# Patient Record
Sex: Male | Born: 1950 | Race: White | Hispanic: No | Marital: Married | Smoking: Current some day smoker
Health system: Southern US, Community
[De-identification: ages and names within clinical notes are randomized; demographics above are authoritative.]

## PROBLEM LIST (undated history)

## (undated) DIAGNOSIS — Z8711 Personal history of peptic ulcer disease: Secondary | ICD-10-CM

## (undated) DIAGNOSIS — D494 Neoplasm of unspecified behavior of bladder: Secondary | ICD-10-CM

## (undated) DIAGNOSIS — G4733 Obstructive sleep apnea (adult) (pediatric): Secondary | ICD-10-CM

## (undated) DIAGNOSIS — F32A Depression, unspecified: Secondary | ICD-10-CM

## (undated) DIAGNOSIS — I10 Essential (primary) hypertension: Secondary | ICD-10-CM

## (undated) DIAGNOSIS — I4891 Unspecified atrial fibrillation: Secondary | ICD-10-CM

## (undated) DIAGNOSIS — C801 Malignant (primary) neoplasm, unspecified: Secondary | ICD-10-CM

## (undated) DIAGNOSIS — R51 Headache: Secondary | ICD-10-CM

## (undated) DIAGNOSIS — R3915 Urgency of urination: Secondary | ICD-10-CM

## (undated) DIAGNOSIS — M25569 Pain in unspecified knee: Secondary | ICD-10-CM

## (undated) DIAGNOSIS — R351 Nocturia: Secondary | ICD-10-CM

## (undated) DIAGNOSIS — I2510 Atherosclerotic heart disease of native coronary artery without angina pectoris: Secondary | ICD-10-CM

## (undated) DIAGNOSIS — K219 Gastro-esophageal reflux disease without esophagitis: Secondary | ICD-10-CM

## (undated) DIAGNOSIS — J449 Chronic obstructive pulmonary disease, unspecified: Secondary | ICD-10-CM

## (undated) DIAGNOSIS — G629 Polyneuropathy, unspecified: Secondary | ICD-10-CM

## (undated) DIAGNOSIS — T8859XA Other complications of anesthesia, initial encounter: Secondary | ICD-10-CM

## (undated) DIAGNOSIS — M1990 Unspecified osteoarthritis, unspecified site: Secondary | ICD-10-CM

## (undated) DIAGNOSIS — T4145XA Adverse effect of unspecified anesthetic, initial encounter: Secondary | ICD-10-CM

## (undated) DIAGNOSIS — R519 Headache, unspecified: Secondary | ICD-10-CM

## (undated) DIAGNOSIS — Z8551 Personal history of malignant neoplasm of bladder: Secondary | ICD-10-CM

## (undated) DIAGNOSIS — F329 Major depressive disorder, single episode, unspecified: Secondary | ICD-10-CM

## (undated) DIAGNOSIS — I700 Atherosclerosis of aorta: Secondary | ICD-10-CM

## (undated) DIAGNOSIS — J302 Other seasonal allergic rhinitis: Secondary | ICD-10-CM

## (undated) DIAGNOSIS — Z8719 Personal history of other diseases of the digestive system: Secondary | ICD-10-CM

## (undated) DIAGNOSIS — K5790 Diverticulosis of intestine, part unspecified, without perforation or abscess without bleeding: Secondary | ICD-10-CM

## (undated) DIAGNOSIS — R0602 Shortness of breath: Secondary | ICD-10-CM

## (undated) DIAGNOSIS — G8929 Other chronic pain: Secondary | ICD-10-CM

## (undated) DIAGNOSIS — K648 Other hemorrhoids: Secondary | ICD-10-CM

## (undated) DIAGNOSIS — E119 Type 2 diabetes mellitus without complications: Secondary | ICD-10-CM

## (undated) HISTORY — DX: Unspecified atrial fibrillation: I48.91

## (undated) HISTORY — PX: HAND SURGERY: SHX662

## (undated) HISTORY — DX: Diverticulosis of intestine, part unspecified, without perforation or abscess without bleeding: K57.90

## (undated) HISTORY — PX: BACK SURGERY: SHX140

## (undated) HISTORY — DX: Other hemorrhoids: K64.8

## (undated) HISTORY — PX: CARDIAC CATHETERIZATION: SHX172

## (undated) HISTORY — DX: Atherosclerosis of aorta: I70.0

## (undated) HISTORY — PX: OTHER SURGICAL HISTORY: SHX169

## (undated) HISTORY — PX: BILATERAL KNEE ARTHROSCOPY: SUR91

---

## 1998-05-29 ENCOUNTER — Encounter: Payer: Self-pay | Admitting: Gastroenterology

## 1998-05-29 ENCOUNTER — Ambulatory Visit (HOSPITAL_COMMUNITY): Admission: RE | Admit: 1998-05-29 | Discharge: 1998-05-29 | Payer: Self-pay | Admitting: Gastroenterology

## 1998-11-09 ENCOUNTER — Ambulatory Visit (HOSPITAL_COMMUNITY): Admission: RE | Admit: 1998-11-09 | Discharge: 1998-11-09 | Payer: Self-pay | Admitting: Orthopaedic Surgery

## 2001-03-11 HISTORY — PX: OTHER SURGICAL HISTORY: SHX169

## 2001-08-31 ENCOUNTER — Encounter: Admission: RE | Admit: 2001-08-31 | Discharge: 2001-08-31 | Payer: Self-pay | Admitting: Family Medicine

## 2001-08-31 ENCOUNTER — Encounter: Payer: Self-pay | Admitting: Family Medicine

## 2001-08-31 ENCOUNTER — Inpatient Hospital Stay (HOSPITAL_COMMUNITY): Admission: EM | Admit: 2001-08-31 | Discharge: 2001-09-05 | Payer: Self-pay | Admitting: Emergency Medicine

## 2001-09-02 ENCOUNTER — Encounter: Payer: Self-pay | Admitting: Internal Medicine

## 2001-09-03 ENCOUNTER — Encounter: Payer: Self-pay | Admitting: Internal Medicine

## 2001-09-21 ENCOUNTER — Ambulatory Visit (HOSPITAL_COMMUNITY): Admission: RE | Admit: 2001-09-21 | Discharge: 2001-09-21 | Payer: Self-pay | Admitting: *Deleted

## 2001-09-29 ENCOUNTER — Inpatient Hospital Stay (HOSPITAL_COMMUNITY): Admission: RE | Admit: 2001-09-29 | Discharge: 2001-10-06 | Payer: Self-pay | Admitting: *Deleted

## 2001-09-29 ENCOUNTER — Encounter (INDEPENDENT_AMBULATORY_CARE_PROVIDER_SITE_OTHER): Payer: Self-pay

## 2001-09-29 HISTORY — PX: OTHER SURGICAL HISTORY: SHX169

## 2002-04-21 ENCOUNTER — Encounter: Admission: RE | Admit: 2002-04-21 | Discharge: 2002-04-21 | Payer: Self-pay | Admitting: Family Medicine

## 2002-04-21 ENCOUNTER — Encounter: Payer: Self-pay | Admitting: Family Medicine

## 2004-01-03 ENCOUNTER — Encounter: Admission: RE | Admit: 2004-01-03 | Discharge: 2004-01-03 | Payer: Self-pay | Admitting: Family Medicine

## 2004-04-12 ENCOUNTER — Encounter (HOSPITAL_COMMUNITY): Admission: RE | Admit: 2004-04-12 | Discharge: 2004-07-11 | Payer: Self-pay | Admitting: Family Medicine

## 2004-04-19 ENCOUNTER — Ambulatory Visit (HOSPITAL_COMMUNITY): Admission: RE | Admit: 2004-04-19 | Discharge: 2004-04-19 | Payer: Self-pay | Admitting: Gastroenterology

## 2004-04-19 ENCOUNTER — Encounter (INDEPENDENT_AMBULATORY_CARE_PROVIDER_SITE_OTHER): Payer: Self-pay | Admitting: *Deleted

## 2004-04-24 ENCOUNTER — Ambulatory Visit (HOSPITAL_COMMUNITY): Admission: RE | Admit: 2004-04-24 | Discharge: 2004-04-24 | Payer: Self-pay | Admitting: Family Medicine

## 2004-06-01 ENCOUNTER — Ambulatory Visit: Payer: Self-pay | Admitting: Internal Medicine

## 2004-07-10 ENCOUNTER — Ambulatory Visit: Payer: Self-pay | Admitting: Internal Medicine

## 2004-07-13 ENCOUNTER — Ambulatory Visit: Payer: Self-pay | Admitting: Internal Medicine

## 2004-08-07 ENCOUNTER — Encounter: Admission: RE | Admit: 2004-08-07 | Discharge: 2004-08-07 | Payer: Self-pay | Admitting: Orthopedic Surgery

## 2004-09-03 ENCOUNTER — Encounter: Admission: RE | Admit: 2004-09-03 | Discharge: 2004-09-03 | Payer: Self-pay | Admitting: Orthopedic Surgery

## 2004-09-12 ENCOUNTER — Encounter: Admission: RE | Admit: 2004-09-12 | Discharge: 2004-09-12 | Payer: Self-pay | Admitting: Orthopedic Surgery

## 2004-09-25 ENCOUNTER — Encounter: Admission: RE | Admit: 2004-09-25 | Discharge: 2004-09-25 | Payer: Self-pay | Admitting: Orthopedic Surgery

## 2005-01-17 ENCOUNTER — Encounter: Admission: RE | Admit: 2005-01-17 | Discharge: 2005-01-17 | Payer: Self-pay | Admitting: Orthopedic Surgery

## 2006-03-06 ENCOUNTER — Encounter: Admission: RE | Admit: 2006-03-06 | Discharge: 2006-03-06 | Payer: Self-pay | Admitting: Orthopedic Surgery

## 2006-03-11 HISTORY — PX: OTHER SURGICAL HISTORY: SHX169

## 2006-04-11 ENCOUNTER — Ambulatory Visit (HOSPITAL_COMMUNITY): Admission: RE | Admit: 2006-04-11 | Discharge: 2006-04-11 | Payer: Self-pay | Admitting: Orthopedic Surgery

## 2006-04-11 HISTORY — PX: CARPAL TUNNEL RELEASE: SHX101

## 2006-09-01 ENCOUNTER — Emergency Department (HOSPITAL_COMMUNITY): Admission: EM | Admit: 2006-09-01 | Discharge: 2006-09-01 | Payer: Self-pay | Admitting: Emergency Medicine

## 2007-04-13 ENCOUNTER — Inpatient Hospital Stay (HOSPITAL_BASED_OUTPATIENT_CLINIC_OR_DEPARTMENT_OTHER): Admission: RE | Admit: 2007-04-13 | Discharge: 2007-04-13 | Payer: Self-pay | Admitting: Interventional Cardiology

## 2007-09-22 ENCOUNTER — Ambulatory Visit (HOSPITAL_BASED_OUTPATIENT_CLINIC_OR_DEPARTMENT_OTHER): Admission: RE | Admit: 2007-09-22 | Discharge: 2007-09-22 | Payer: Self-pay | Admitting: Rheumatology

## 2007-09-26 ENCOUNTER — Ambulatory Visit: Payer: Self-pay | Admitting: Internal Medicine

## 2007-10-21 ENCOUNTER — Encounter: Admission: RE | Admit: 2007-10-21 | Discharge: 2007-10-21 | Payer: Self-pay | Admitting: Orthopedic Surgery

## 2009-05-04 ENCOUNTER — Ambulatory Visit (HOSPITAL_COMMUNITY): Admission: RE | Admit: 2009-05-04 | Discharge: 2009-05-04 | Payer: Self-pay | Admitting: Gastroenterology

## 2010-04-02 LAB — URINALYSIS, ROUTINE W REFLEX MICROSCOPIC
Bilirubin Urine: NEGATIVE
Hgb urine dipstick: NEGATIVE
Ketones, ur: NEGATIVE mg/dL
Nitrite: NEGATIVE
Protein, ur: NEGATIVE mg/dL
Specific Gravity, Urine: 1.024 (ref 1.005–1.030)
Urine Glucose, Fasting: NEGATIVE mg/dL
pH: 6 (ref 5.0–8.0)

## 2010-04-02 LAB — URINE CULTURE: Culture: NO GROWTH

## 2010-04-02 LAB — BASIC METABOLIC PANEL
BUN: 16 mg/dL (ref 6–23)
CO2: 28 mEq/L (ref 19–32)
Chloride: 102 mEq/L (ref 96–112)
Creatinine, Ser: 1.1 mg/dL (ref 0.4–1.5)
GFR calc non Af Amer: >60 mL/min (ref >60)
Glucose, Bld: 85 mg/dL (ref 70–99)
Potassium: 4.5 mEq/L (ref 3.5–5.1)
Sodium: 138 mEq/L (ref 135–145)

## 2010-04-02 LAB — CBC
Hemoglobin: 15.2 g/dL (ref 13.0–17.0)
RBC: 5.37 MIL/uL (ref 4.22–5.81)
RDW: 13.7 % (ref 11.5–15.5)

## 2010-04-02 LAB — HEPATIC FUNCTION PANEL
Alkaline Phosphatase: 82 U/L (ref 39–117)
Total Protein: 7.1 g/dL (ref 6.0–8.3)

## 2010-04-02 LAB — TYPE AND SCREEN: Antibody Screen: NEGATIVE

## 2010-04-02 LAB — ABO/RH: ABO/RH(D): O POS

## 2010-04-02 LAB — PROTIME-INR: INR: 1 (ref 0.00–1.49)

## 2010-04-02 LAB — DIFFERENTIAL
Basophils Absolute: 0.1 10*3/uL (ref 0.0–0.1)
Lymphocytes Relative: 35 % (ref 12–46)
Lymphs Abs: 3.6 10*3/uL (ref 0.7–4.0)
Neutrophils Relative %: 55 % (ref 43–77)

## 2010-04-02 LAB — SURGICAL PCR SCREEN: Staphylococcus aureus: NEGATIVE

## 2010-04-03 ENCOUNTER — Inpatient Hospital Stay (HOSPITAL_COMMUNITY)
Admission: RE | Admit: 2010-04-03 | Discharge: 2010-04-06 | Payer: Self-pay | Source: Home / Self Care | Attending: Orthopedic Surgery | Admitting: Orthopedic Surgery

## 2010-04-03 HISTORY — PX: TOTAL KNEE ARTHROPLASTY: SHX125

## 2010-04-04 LAB — BASIC METABOLIC PANEL
BUN: 11 mg/dL (ref 6–23)
Chloride: 103 mEq/L (ref 96–112)
Creatinine, Ser: 1.11 mg/dL (ref 0.4–1.5)
Glucose, Bld: 146 mg/dL — ABNORMAL HIGH (ref 70–99)
Potassium: 3.8 mEq/L (ref 3.5–5.1)

## 2010-04-04 LAB — GLUCOSE, CAPILLARY: Glucose-Capillary: 118 mg/dL — ABNORMAL HIGH (ref 70–99)

## 2010-04-04 LAB — CBC
HCT: 34.6 % — ABNORMAL LOW (ref 39.0–52.0)
MCHC: 33.5 g/dL (ref 30.0–36.0)
Platelets: 190 10*3/uL (ref 150–400)

## 2010-04-04 LAB — PROTIME-INR
INR: 1.13 (ref 0.00–1.49)
Prothrombin Time: 14.7 seconds (ref 11.6–15.2)

## 2010-04-05 LAB — GLUCOSE, CAPILLARY
Glucose-Capillary: 154 mg/dL — ABNORMAL HIGH (ref 70–99)
Glucose-Capillary: 129 mg/dL — ABNORMAL HIGH (ref 70–99)

## 2010-04-05 LAB — BASIC METABOLIC PANEL
BUN: 9 mg/dL (ref 6–23)
Calcium: 9 mg/dL (ref 8.4–10.5)
Creatinine, Ser: 1.18 mg/dL (ref 0.4–1.5)
GFR calc Af Amer: >60 mL/min (ref >60)
GFR calc non Af Amer: >60 mL/min (ref >60)

## 2010-04-05 LAB — PROTIME-INR
INR: 2.11 — ABNORMAL HIGH (ref 0.00–1.49)
Prothrombin Time: 23.8 seconds — ABNORMAL HIGH (ref 11.6–15.2)

## 2010-04-05 LAB — CBC
HCT: 34.1 % — ABNORMAL LOW (ref 39.0–52.0)
Hemoglobin: 11.4 g/dL — ABNORMAL LOW (ref 13.0–17.0)
RBC: 4.1 MIL/uL — ABNORMAL LOW (ref 4.22–5.81)

## 2010-04-06 LAB — GLUCOSE, CAPILLARY: Glucose-Capillary: 147 mg/dL — ABNORMAL HIGH (ref 70–99)

## 2010-04-06 LAB — CBC
Hemoglobin: 11.8 g/dL — ABNORMAL LOW (ref 13.0–17.0)
MCHC: 34 g/dL (ref 30.0–36.0)
WBC: 9.1 10*3/uL (ref 4.0–10.5)

## 2010-04-06 LAB — PROTIME-INR
INR: 2.17 — ABNORMAL HIGH (ref 0.00–1.49)
Prothrombin Time: 24.3 seconds — ABNORMAL HIGH (ref 11.6–15.2)

## 2010-04-11 NOTE — Op Note (Signed)
[name], [name]              ACCOUNT NO.:  [id number]  MEDICAL RECORD NO.:  [id number]          PATIENT TYPE:  INP  LOCATION:  5021                         FACILITY:  [facility]  PHYSICIAN:  Burnard Bunting, M.D.    DATE OF BIRTH:  Jul 09, 1950  DATE OF PROCEDURE:  04/03/2010 DATE OF DISCHARGE:                              OPERATIVE REPORT   PREOPERATIVE DIAGNOSIS:  Right knee arthritis.  POSTOPERATIVE DIAGNOSIS:  Right knee arthritis.  PROCEDURE:  Right total knee replacement.  SURGEON:  Burnard Bunting, MD  ASSISTANT:  Wende Neighbors, PA  ANESTHESIA:  General endotracheal.  ESTIMATED BLOOD LOSS:  100 mL.  TOURNIQUET TIME:  120 minutes at 300 mmHg.  INDICATIONS:  Michael Horn is a 60 year old patient with end-stage right knee arthritis presents for operative management after explanation of risks and benefits.  COMPONENTS UTILIZED:  DePuy rotating platform 4 femur, 5 tibia, 12.5 poly, 38 patella posterior cruciate ligament sacrificing.  PROCEDURE IN DETAIL:  The patient was brought to the operating room where general endotracheal anesthesia was used.  Preoperative antibiotics administered.  Right leg foot and knee was prescrubbed with alcohol and Betadine which allowed to air dry, then prepped DuraPrep solution and draped in sterile manner.  Collier Flowers was used to cover out the field.  Time-out was called.  Right leg was then elevated, exsanguinated with Esmarch wrap and tourniquet was inflated.  Anterior approach to the knee was utilized.  Skin and subcutaneous tissues sharply divided. Median parapatellar approach was made.  Precise location was marked with #1 Vicryl suture and soft tissue was removed from the anterior distal femur, taking care not to disrupt the periosteum over the anterior distal femur.  Fat pad was partially excised.  Lateral patellofemoral ligament was released.  The patient did have a lot of scar tissue from previous-open meniscectomy on the lateral  side.  Fat pad was partially excised.  Two pins were placed in the distal medial femur and proximal medial tibia.  Registration points including hip center, rotation, bimalleolar axis and the various points about the knee were obtained. At this time, a cut was made to perpendicular mechanical axis on the tibia.  Skin and subcutaneous of the posterior structures and collaterals were well protected.  Tensioning was placed in both extension and flexion.  Distal cut was made and a 10 spacer block did fit.  The chamfer cuts and box cut was then made.  Initially, the femur was sized to 5, however minimal posterior condyle resection was made and this was then changed to a 4.  The patient also after the box cut and then keel punch and preparing the tibia, the patient had about a 6- degree flexion contracture, 2 more millimeters were recut off the distal femur and the soft tissue releases posteriorly were performed bluntly. This resulted in full extension with a 10 and 12.5 spacer.  Patella was then prepared in freehand fashion, 38 3 peg patella was placed.  Patella had good tracking using no thumbs technique.  Lateral release was required because of extensive scarring in the lateral retinacular tissues from the prior open lateral meniscectomy.  The patella was not able to be everted during the case and thus somewhat predictable that the lateral release would have to be performed.  Nonetheless, with the patient achieved neutral alignment, full flexion and extension with good patellar tracking with trial components in position, these components were removed.  Cut bony surfaces were irrigated.  True components were cemented into position including the 12.5 poly, which gave 1 degree of hyperextension in neutral alignment.  Cement was allowed to harden. Excess cement was removed.  Incision was then thoroughly irrigated. Tourniquet was released.  Bleeding points encountered and controlled using  electrocautery.  Hemovac drain was placed.  The incision was then closed over bolster using interrupted inverted #1 Vicryl suture, followed by interrupted inverted 0 Vicryl suture, 2-0 Vicryl suture and skin staples.  The small incisions for the pins were irrigated and closed using 3-0 Nylon suture.  Solution of Marcaine, morphine, and clonidine injected into the knee.  The patient tolerated the procedure well without immediate complications.  Bulky wrap was applied.  Velna Hatchet Vernon's assistance was required all times during the case for retraction of important neurovascular structures, opening, closing and drilling.  Her assistance was a medical necessity.     Burnard Bunting, M.D.     GSD/MEDQ  D:  04/03/2010  T:  04/04/2010  Job:  [id number]  Electronically Signed by Reece Agar.  DEAN M.D. on 04/11/2010 03:43:41 PM

## 2010-05-09 NOTE — Discharge Summary (Signed)
  Michael Horn, Michael Horn              ACCOUNT NO.:  [id number]  MEDICAL RECORD NO.:  [id number]          PATIENT TYPE:  INP  LOCATION:  5021                         FACILITY:  [facility]  PHYSICIAN:  Burnard Bunting, M.D.    DATE OF BIRTH:  Jul 20, 1950  DATE OF ADMISSION:  04/03/2010 DATE OF DISCHARGE:  04/06/2010                              DISCHARGE SUMMARY   DISCHARGE DIAGNOSIS:  Right knee arthritis.  PROCEDURES:  Right total knee replacement performed on April 03, 2010.  HOSPITAL COURSE:  Please see the hospital records for details regarding the patient's hospital course and summary.  The patient underwent right total knee replacement on April 03, 2010, tolerated the procedure well, transferred to the floor in stable condition.  Foot was perfused, sensate mobile.  On postop day #1, started on Coumadin for DVT prophylaxis.  Hemoglobin 11 on postop day #2.  INR 2.17 by the time of discharge, hemoglobin 11.8.  He was mobilized in physical therapy and in CPM machine.  His pain was well- controlled.  Incision was intact.  He was discharged home in good condition on April 06, 2010.  He will follow up with me in about 10 days for suture removal.  Continue with home health PT and home CPM and home PT for range of motion exercises.  DISCHARGE MEDICATIONS:  Include 1. Flovent 2 puffs twice daily. 2. Percocet 10/325 one p.o. q.3-4 h. p.r.n. pain. 3. Mucinex one p.o. twice daily as needed. 4. Xanax 1 mg p.o. at night as needed. 5. Pravastatin 40 mg two tabs daily. 6. Lisinopril 5 mg p.o. daily. 7. Celexa 40 mg p.o. daily. 8. Nexium 40 mg p.o. daily. 9. Amaryl 1 mg p.o. daily. 10.Fenofibrate 160 mg p.o. daily. 11.Metformin 850 mg p.o. twice daily. 12.Stool softener. 13.Robaxin 500 mg p.o. q.8 h. p.r.n. spasm. 14.Coumadin 5 mg p.o. daily to INR 2 to 2.5.    Burnard Bunting, M.D.    GSD/MEDQ  D:  04/19/2010  T:  04/20/2010  Job:  [id number]  Electronically Signed by Reece Agar.  DEAN M.D.  on 05/09/2010 08:32:46 AM

## 2010-07-06 ENCOUNTER — Ambulatory Visit (HOSPITAL_BASED_OUTPATIENT_CLINIC_OR_DEPARTMENT_OTHER)
Admission: RE | Admit: 2010-07-06 | Discharge: 2010-07-06 | Disposition: A | Discharge status: Home / Self Care | Payer: BC Managed Care – PPO | Source: Ambulatory Visit | Attending: Urology | Admitting: Urology

## 2010-07-06 ENCOUNTER — Other Ambulatory Visit: Payer: Self-pay | Admitting: Urology

## 2010-07-06 DIAGNOSIS — Z79899 Other long term (current) drug therapy: Secondary | ICD-10-CM | POA: Insufficient documentation

## 2010-07-06 DIAGNOSIS — J4489 Other specified chronic obstructive pulmonary disease: Secondary | ICD-10-CM | POA: Insufficient documentation

## 2010-07-06 DIAGNOSIS — Z7901 Long term (current) use of anticoagulants: Secondary | ICD-10-CM | POA: Insufficient documentation

## 2010-07-06 DIAGNOSIS — Z87891 Personal history of nicotine dependence: Secondary | ICD-10-CM | POA: Insufficient documentation

## 2010-07-06 DIAGNOSIS — K219 Gastro-esophageal reflux disease without esophagitis: Secondary | ICD-10-CM | POA: Insufficient documentation

## 2010-07-06 DIAGNOSIS — E119 Type 2 diabetes mellitus without complications: Secondary | ICD-10-CM | POA: Insufficient documentation

## 2010-07-06 DIAGNOSIS — C679 Malignant neoplasm of bladder, unspecified: Secondary | ICD-10-CM | POA: Insufficient documentation

## 2010-07-06 DIAGNOSIS — E7800 Pure hypercholesterolemia, unspecified: Secondary | ICD-10-CM | POA: Insufficient documentation

## 2010-07-06 DIAGNOSIS — Z01812 Encounter for preprocedural laboratory examination: Secondary | ICD-10-CM | POA: Insufficient documentation

## 2010-07-06 DIAGNOSIS — N3289 Other specified disorders of bladder: Secondary | ICD-10-CM | POA: Insufficient documentation

## 2010-07-06 DIAGNOSIS — R310 Gross hematuria: Secondary | ICD-10-CM | POA: Insufficient documentation

## 2010-07-06 DIAGNOSIS — J449 Chronic obstructive pulmonary disease, unspecified: Secondary | ICD-10-CM | POA: Insufficient documentation

## 2010-07-06 DIAGNOSIS — I10 Essential (primary) hypertension: Secondary | ICD-10-CM | POA: Insufficient documentation

## 2010-07-06 HISTORY — PX: TRANSURETHRAL RESECTION OF BLADDER TUMOR: SHX2575

## 2010-07-06 LAB — GLUCOSE, CAPILLARY: Glucose-Capillary: 166 mg/dL — ABNORMAL HIGH (ref 70–99)

## 2010-07-06 LAB — POCT I-STAT 4, (NA,K, GLUC, HGB,HCT)
Glucose, Bld: 155 mg/dL — ABNORMAL HIGH (ref 70–99)
HCT: 38 % — ABNORMAL LOW (ref 39.0–52.0)
Hemoglobin: 12.9 g/dL — ABNORMAL LOW (ref 13.0–17.0)
Potassium: 4.1 mEq/L (ref 3.5–5.1)
Sodium: 140 mEq/L (ref 135–145)

## 2010-07-10 NOTE — Op Note (Signed)
  [name], Michael Horn              ACCOUNT NO.:  [id number]  MEDICAL RECORD NO.:  [id number]          PATIENT TYPE:  INP  LOCATION:  5021                         FACILITY:  [facility]  PHYSICIAN:  Charae Depaolis C. Vernie Ammons, M.D.  DATE OF BIRTH:  1950-05-18  DATE OF PROCEDURE:  07/06/2010 DATE OF DISCHARGE:  04/06/2010                              OPERATIVE REPORT   PREOPERATIVE DIAGNOSIS:  Bladder tumor.  POSTOPERATIVE DIAGNOSIS:  Bladder tumor.  PROCEDURE:  Transurethral resection of bladder tumor (3 cm).  SURGEON:  Yamari Ventola C. Vernie Ammons, M.D.  ANESTHESIA:  General.  SPECIMENS: 1. Portions of bladder tumor. 2. Cold cup biopsy of base of bladder tumor/bladder wall.  BLOOD LOSS:  Minimal.  DRAINS:  None.  COMPLICATIONS:  None.  INDICATIONS:  The patient is a 60 year old male who experienced gross hematuria on Coumadin.  Upper tract study was normal and cystoscopy revealed a papillary tumor involving the right trigonal region and right ureteral orifice but primarily located posterior and lateral to the right ureteral orifice.  We discussed the planned procedure as well as its risks, complications and limitations.  The patient understands and elected to proceed.  DESCRIPTION OF OPERATION:  After informed consent, the patient was brought to the major OR, placed on table, administered general anesthesia, then moved to the dorsal lithotomy position.  His genitalia was then sterilely prepped and draped and an official time-out was then performed.  Initially, the 22-French cystoscope with 12-degree lens was passed under direct vision down the urethra which was noted to be entirely normal and through the prostatic urethra which was noted to have bilobar hypertrophy and elongation of the prostatic urethra.  Upon entering the bladder, it was fully and systematically inspected.  There was 1+ trabeculation noted.  The ureteral orifices were of normal configuration and position.  The single tumor  was identified over on the right hemitrigone/ureteral orifice region and did have papillary abnormality involving the right ureteral orifice superficially.  The cystoscope was removed and the 28-French resectoscope with Timberlake obturator introduced in the bladder, the obturator removed and the resectoscope element with 12-degree lens inserted.  I then resected the bladder tumor away completely down to the bladder wall.  I also resected across the right ureteral orifice but did not coagulate in this area.  I then inserted cold cup biopsy forceps and took a cold cup biopsy from the detrusor at the base of the bladder tumor and sent this separately. I then reinserted the resectoscope and fulgurated bleeding points. After all bladder tumor had been removed from the bladder, the bladder was drained and the resectoscope was removed and the patient was awakened and taken to recovery room in stable and satisfactory condition.  He tolerated the procedure well and there were no intraoperative complications.  He will be given a prescription for Vicodin HP #24 and Pyridium 200 #30 with written instructions and followup in my office in 1 week.     Tinika Bucknam C. Vernie Ammons, M.D.     MCO/MEDQ  D:  07/06/2010  T:  07/06/2010  Job:  [id number]  Electronically Signed by Ihor Gully M.D. on 07/10/2010 04:12:23 AM

## 2010-07-24 NOTE — Cardiovascular Report (Signed)
[name], [name]              ACCOUNT NO.:  [id number]   MEDICAL RECORD NO.:  [id number]          PATIENT TYPE:  OIB   LOCATION:  1962                         FACILITY:  [facility]   PHYSICIAN:  Corky Crafts, MDDATE OF BIRTH:  07/26/1950   DATE OF PROCEDURE:  DATE OF DISCHARGE:                            CARDIAC CATHETERIZATION   PROCEDURES:  Left heart catheterization, left ventriculogram coronary  angiogram, abdominal aortogram.   INDICATIONS:  Abnormal stress test, chest pain, diabetes, high  triglycerides.   PROCEDURE:  The risks and benefits of cardiac catheterization were  explained to the patient and informed consent was obtained.  The patient  was brought to the cath lab.  He was prepped and draped in the usual  sterile fashion.  His right groin was infiltrated with 1% lidocaine.  A  4-French arterial sheath was placed into the right femoral artery using  modified Seldinger technique.  Left coronary artery angiography was  initially attempted with a JL-4 pigtail catheter.  Eventually a JL-5  catheter was used.  Digital angiography was performed in multiple  projections using hand injection of contrast.  Right coronary artery  angiography was performed using a JR-4.5 catheter.  Catheter was  advanced to the vessel ostium under fluoroscopic guidance.  Digital  angiography was performed in multiple projections using hand injection  of contrast.  Pigtail catheter was advanced to the ascending aorta and  across the aortic valve under fluoroscopic guidance.  A power injection  of contrast was performed in the RAO projection to image the left  ventricle.  The catheter was pulled back under continuous hemodynamic  pressure monitoring.  The catheter was then pulled back to the abdominal  aorta.  Power injection of contrast was performed in the AP projection  to image the abdominal aorta.  The sheath was removed using manual  compression.   FINDINGS:  The left main coronary  artery was normal in size and widely  patent.  The circumflex is a medium-sized vessel and widely patent.  There is a medium-sized first obtuse marginal which was widely patent.  There is a medium-sized ramus intermediate vessel which was branching  and widely patent.  There are mild irregularities in this vessel.  The  left anterior descending was a large vessel with a minimal luminal  irregularities throughout.  This vessel did wrap around to the apex.  There is a first diagonal which is medium-sized and widely patent.  There is a second diagonal which was slightly larger and also widely  patent with no significant irregularities.  The right coronary artery  was a large dominant vessel and angiographically normal.  The abdominal  aortogram showed bilateral single renal arteries which were widely  patent.  There was no abdominal aortic aneurysm.   HEMODYNAMIC RESULTS:  Left ventricular pressure 92/80 with an LVEDP of  13 mmHg.  Aortic pressure of 96/60 with a mean aortic pressure of 77  mmHg.  Left ventriculogram showed normal ventricular function with an  estimated ejection fraction of 60%.   IMPRESSION:  1. No significant coronary artery disease.  The patient is likely  having noncardiac chest pain.  2. Normal ventricular function.  3. No abdominal aortic aneurysm.  4. Normal hemodynamics.  5. No renal artery stenosis   RECOMMENDATIONS:  Continue aggressive risk factor modification and  smoking cessation attempts.  I will follow-up with him in the office.      Corky Crafts, MD  Electronically Signed     JSV/MEDQ  D:  04/13/2007  T:  04/13/2007  Job:  [id number]

## 2010-07-27 NOTE — Op Note (Signed)
[facility]  Patient:    Michael Horn, Michael Horn Visit Number: [id number] MRN: [id number]          Service Type: SUR Location: 3W 0358 01 Attending Physician:  Vikki Ports Dictated by:   Vikki Ports, M.D. Proc. Date: 09/29/01 Admit Date:  09/29/2001   CC:         Melvyn Neth D. Clovis Riley, M.D.   Operative Report  PREOPERATIVE DIAGNOSIS:  Sigmoid diverticulitis.  POSTOPERATIVE DIAGNOSIS:  Sigmoid diverticulitis.  PROCEDURE:  Sigmoid colectomy with splenic flexure mobilization.  SURGEON:  Catalina Lunger, M.D.  ASSISTANT:  Sheppard Plumber. Earlene Plater, M.D.  ANESTHESIA:  General.  DESCRIPTION OF PROCEDURE:  The patient was taken to the operating room and placed in the supine position.  After adequate anesthesia was induced using endotracheal tube, the abdomen was prepped and draped in the normal sterile fashion.  Using a vertical lower midline incision, I dissected down to the fascia and opened it along the midline.  The peritoneum was entered.  Sigmoid colon was identified.  The remainder of the abdominal cavity was inspected. No pathology was noted.  Sigmoid colon was quite inflamed and had a localized abscess adjacent to the lateral peritoneum just lateral and proximal to the left iliac vessels.  The inflammation was taken down using blunt dissection, and the splenic flexure was mobilized.  It was transected using the GIA stapling device just at the junction of the rectum and sigmoid.  The mesentery was taken down between [facility] clamps.  The proximal end was identified, appeared to be healthy bowel in the mid descending colon.  Splenic flexure was quite adherent to the spleen and therefore had to be completely mobilized in order to perform the anastomosis without tension.  After this had been accomplished, the GI stapling device completed the division of the descending colon, and the specimen was removed.  An end-to-end anastomosis  using interrupted 3-0 silk full-thickness sutures was accomplished.  Adequate hemostasis was ensured.  The anastomosis was under no tension.  The proximal colon was clamped as it had been during the anastomosis, and rigid sigmoidoscopy was performed.  The anastomosis was visualized.  The colon and the anastomosis were well-distended with saline in the pelvis.  There was no evidence of leak.  The abdomen was copiously irrigated.  The fascia was closed with running #1 Novofil.  Skin was closed with staples.  The patient tolerated the procedure well and went to PACU in good condition. Dictated by:   Vikki Ports, M.D. Attending Physician:  Danna Hefty R. DD:  09/29/01 TD:  10/03/01 Job: [id number]
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
## 2010-07-27 NOTE — Discharge Summary (Signed)
[place]  Patient:    Michael Horn, Michael Horn Visit Number: [id number] MRN: [id number]          Service Type: MED Location: 5100 [id number] 01 Attending Physician:  Selina Cooley Dictated by:   Selina Cooley, M.D. Admit Date:  08/31/2001 Discharge Date: 09/05/2001   CC:         Melvyn Neth D. Clovis Riley, M.D.  Vikki Ports, M.D.   Discharge Summary  DISCHARGE DIAGNOSES: 1. Diverticulitis of descending and sigmoid colon. 2. Leukocytosis, 14.7. 3. Alcohol abuse. 4. Tobacco use. 5. History of umbilical hernia repair, back surgery, peptic ulcer disease.  DISCHARGE MEDICATIONS: 1. Ciprofloxacin 500 mg p.o. b.i.d. X9 days. 2. Flagyl 500 mg q.i.d. X9 days. 3. Thiamine 100 mg p.o. q.d. X1 week. 4. Multivitamin one p.o. q.d.  FOLLOW UP:  Follow up with Dr. Luan Pulling in two to three weeks.  Follow up with Dr. Clovis Riley in five to seven days.  CONDITION ON DISCHARGE:  Stable, tolerating p.o. diet with markedly improved pain, afebrile.  REASON FOR ADMISSION:  The patient is a 60 year old white male presenting with a three day history of left lower quadrant pain associated with fever and chills.  HOSPITAL COURSE: #1 - ACUTE DIVERTICULITIS OF DESCENDING AND SIGMOID COLON:  General surgery consulted on this case.  CT scan was consistent with diverticulitis.  The patient was treated conservatively with intravenous antibiotics which were transitioned to p.o. antibiotics at the time of discharge.  His fever and leukocytosis resolved as well as his symptoms of left lower quadrant pain and signs of rebound.  A follow up CT scan did reveal some slight increase in inflammation and extra luminal air without evidence of abscess.  It is possible there might have been a small rupture of the diverticulum, however, the patient ultimately improved on conservative management and was discharged to home.  #2 - ALCOHOL ABUSE:  The patient did endorse drinking  approximately one case of beer a week.  There was no evidence of withdrawal during his hospitalization.  He was treated with multivitamin.  #3 - DEHYDRATION, HYPOKALEMIA:  Corrected. Dictated by:   Selina Cooley, M.D. Attending Physician:  Selina Cooley DD:  09/15/01 TD:  09/18/01 Job: [id number]

## 2010-07-27 NOTE — Op Note (Signed)
Michael Horn, FREEL              ACCOUNT NO.:  [id number]   MEDICAL RECORD NO.:  [id number]          PATIENT TYPE:  AMB   LOCATION:  ENDO                         FACILITY:  [facility]   PHYSICIAN:  Danise Edge, M.D.   DATE OF BIRTH:  Aug 04, 1950   DATE OF PROCEDURE:  04/19/2004  DATE OF DISCHARGE:                                 OPERATIVE REPORT   PROCEDURE PERFORMED:  Colonoscopy and rectal polypectomy.   INDICATIONS:  Mr. Dreyton Roessner is a 60 year old male born March 03, 1951. Mr. Raju is scheduled to undergo his first screening colonoscopy  with polypectomy to prevent colon cancer. He did undergo a segmental colon  resection performed by Vikki Ports, MD to treat diverticular  perforation with abscess.   ENDOSCOPIST:  Danise Edge, M.D.   PREMEDICATION:  Versed 8.5 milligrams, Demerol 60 milligrams.   PROCEDURE:  After obtaining informed consent, Mr. Orton was placed in the  left lateral decubitus position. I administered intravenous Demerol and  intravenous Versed to achieve conscious sedation for the procedure. The  patient's blood pressure, oxygen saturation and cardiac rhythm were  monitored throughout the procedure and documented in the medical record.   Anal inspection and digital rectal exam were normal. The Olympus adjustable  pediatric colonoscope was introduced into the rectum and advanced to the  cecum. Colonic preparation for the exam today was excellent.   Rectum. From the distal rectum, a 2 mm sessile polyp was removed with  electrocautery snare.  Sigmoid colon and descending colon normal.  Splenic flexure normal.  Transverse colon normal.  Hepatic flexure normal.  Ascending colon normal.  Cecum and ileocecal valve normal.   ASSESSMENT:  A small polyp was removed from the distal rectum; otherwise  normal proctocolonoscopy to cecum.      MJ/MEDQ  D:  04/19/2004  T:  04/19/2004  Job:  [id number]   cc:   Donia Guiles, M.D.  [address] [place]  [place] [address]  Fax: [phone]

## 2010-07-27 NOTE — Discharge Summary (Signed)
   Michael Horn, Michael Horn                        ACCOUNT NO.:  [id number]   MEDICAL RECORD NO.:  [id number]                   PATIENT TYPE:  INP   LOCATION:  0358                                 FACILITY:  [facility]   PHYSICIAN:  Vikki Ports, M.D.         DATE OF BIRTH:  [birth year]   DATE OF ADMISSION:  09/29/2001  DATE OF DISCHARGE:  10/06/2001                                 DISCHARGE SUMMARY   ADMISSION DIAGNOSIS:  Diverticulitis.   DISCHARGE DIAGNOSIS:  Diverticulitis.   SECONDARY DIAGNOSES:  1. Alcohol abuse.  2. Tobacco abuse.  3. History of umbilical hernia repair.  4. History of back surgery.  5. History of peptic ulcer disease.   CONDITION ON DISCHARGE:  Good and improved.   PROCEDURE:  Sigmoid colectomy.   SURGEON:  Vikki Ports, M.D.   HISTORY OF PRESENT ILLNESS:  Briefly, the patient is a 60 year old white  male who was hospitalized at [facility] approximately  six weeks prior to this admission for nonsurgical treatment of acute  diverticulitis with localized perforation.  The patient did well on IV  antibiotics, was maintained on p.o. antibiotics at home.  The pain was  significantly improved.  Leukocytosis had resolved and the patient was  prepared for sigmoid colectomy.   HOSPITAL COURSE:  The patient was admitted after home bowel prep, taken to  the operating room where he underwent sigmoid colectomy.  His postoperative  course was very routine.  Foley catheter was removed on postoperative day  #1.  Abdomen was soft.  He remained afebrile.  He began passing some gas on  postoperative day #4.  On postoperative day #5, he still remained somewhat  distended but continued to pass some gas.  On postoperative day #6, he began  feeling hungry and tolerating a clear liquid diet.  The patient had small  bowel movement and was ready for discharge home.   FOLLOW UP:  This is with me two weeks after discharge.   DISCHARGE  MEDICATION:  Vicodin one to two q.4h. p.r.n. pain.   ACTIVITY:  No heavy lifting over 10 pounds.                                               Vikki Ports, M.D.   KRH/MEDQ  D:  11/03/2001  T:  11/04/2001  Job:  [id number]

## 2010-07-27 NOTE — Op Note (Signed)
NAME:  Michael Horn, Michael Horn              ACCOUNT NO.:  [id number]   MEDICAL RECORD NO.:  [id number]          PATIENT TYPE:  AMB   LOCATION:  SDS                          FACILITY:  [facility]   PHYSICIAN:  Burnard Bunting, M.D.    DATE OF BIRTH:  [birth year]   DATE OF PROCEDURE:  04/11/2006  DATE OF DISCHARGE:                               OPERATIVE REPORT   PREOPERATIVE DIAGNOSES:  Right carpal tunnel syndrome, and right third  trigger finger.   POSTOPERATIVE DIAGNOSES:  Right carpal tunnel syndrome, and right third  trigger finger.   PROCEDURES:  Right carpal tunnel release, and trigger finger release,  #3.   SURGEON:  Burnard Bunting, M.D.   ASSISTANT:  None.   ANESTHESIA:  General.   ESTIMATED BLOOD LOSS:  10 mL.   TOURNIQUET TIME:  Approximately 30 minutes at 250 mmHg.   INDICATIONS:  Michael Horn is a 60 year old patient with severe right  third trigger finger and carpal tunnel syndrome.  He presents now for  operative management after explanation of risks and benefits and failure  of conservative management.   OPERATIVE PROCEDURES IN DETAIL:  The patient was brought to the  operating room, where general endotracheal anesthesia was induced.  Preoperative IV antibiotics were administered.  The right hand and arm  were prepped with Hibiclens and sterilely prepped with DuraPrep solution  and draped in a sterile manner.  Topographic anatomy of the hand was  identified, including the [facility] cardinal line, the distal wrist flexion  crease, as well as the radial border of the fourth finger.  The hand was  elevated and exsanguinated with the Esmarch wrap.  The tourniquet was  inflated.  An incision was made on the border at the intersection of the  [place] cardinal line and the radial border of the fourth finger down to  the proximal wrist flexion crease.  The skin and subcutaneous tissues  were sharply divided.  The palmar fascia was divided.  The transverse  carpal ligament was  identified and incised in its midportion over 2 to 3  mm.  A right-angle retractor was then placed between the median nerve  and the transverse carpal ligament, which was then released under direct  visualization distally and proximally.  At this time, the incision was  thoroughly irrigated.  No masses were palpable within the carpal tunnel.  The motor branch was intact.  The skin was anesthetized with Marcaine.  At this time, an incision was made over the A1 pulley of the third  finger transversely.  The skin and subcutaneous tissues were sharply  divided.  The neurovascular bundles were retracted ulnarly and radially.  The proximal and distal aspects of the A1 pulley were identified under  direct visualization and transected longitudinally.  A finger was taken  through a range of motion and no triggering was present.  This incision  was also irrigated, and the skin was anesthetized with Marcaine.  The  tourniquet was released.  Bleeding  points encountered were controlled using bipolar electrocautery.  The  skin incisions were closed using a 3-0 nylon suture.  A  bulky dressing  was then applied.  The patient tolerated the procedure well without  immediate complication.      Burnard Bunting, M.D.  Electronically Signed     GSD/MEDQ  D:  04/11/2006  T:  04/11/2006  Job:  [id number]

## 2010-08-19 ENCOUNTER — Emergency Department (HOSPITAL_COMMUNITY): Payer: Medicare Other

## 2010-08-19 ENCOUNTER — Encounter (HOSPITAL_COMMUNITY): Payer: Self-pay | Admitting: Radiology

## 2010-08-19 ENCOUNTER — Emergency Department (HOSPITAL_COMMUNITY)
Admission: EM | Admit: 2010-08-19 | Discharge: 2010-08-19 | Disposition: A | Discharge status: Home / Self Care | Payer: Medicare Other | Attending: Emergency Medicine | Admitting: Emergency Medicine

## 2010-08-19 DIAGNOSIS — J449 Chronic obstructive pulmonary disease, unspecified: Secondary | ICD-10-CM | POA: Insufficient documentation

## 2010-08-19 DIAGNOSIS — E119 Type 2 diabetes mellitus without complications: Secondary | ICD-10-CM | POA: Insufficient documentation

## 2010-08-19 DIAGNOSIS — R51 Headache: Secondary | ICD-10-CM | POA: Insufficient documentation

## 2010-08-19 DIAGNOSIS — R209 Unspecified disturbances of skin sensation: Secondary | ICD-10-CM | POA: Insufficient documentation

## 2010-08-19 DIAGNOSIS — K5730 Diverticulosis of large intestine without perforation or abscess without bleeding: Secondary | ICD-10-CM | POA: Insufficient documentation

## 2010-08-19 DIAGNOSIS — N400 Enlarged prostate without lower urinary tract symptoms: Secondary | ICD-10-CM | POA: Insufficient documentation

## 2010-08-19 DIAGNOSIS — J4489 Other specified chronic obstructive pulmonary disease: Secondary | ICD-10-CM | POA: Insufficient documentation

## 2010-08-19 DIAGNOSIS — K219 Gastro-esophageal reflux disease without esophagitis: Secondary | ICD-10-CM | POA: Insufficient documentation

## 2010-08-19 HISTORY — DX: Essential (primary) hypertension: I10

## 2010-08-19 HISTORY — DX: Malignant (primary) neoplasm, unspecified: C80.1

## 2010-08-19 LAB — DIFFERENTIAL
Basophils Absolute: 0 10*3/uL (ref 0.0–0.1)
Basophils Relative: 0 % (ref 0–1)
Eosinophils Relative: 2 % (ref 0–5)
Monocytes Absolute: 0.4 10*3/uL (ref 0.1–1.0)
Neutro Abs: 4.7 10*3/uL (ref 1.7–7.7)

## 2010-08-19 LAB — COMPREHENSIVE METABOLIC PANEL
AST: 53 U/L — ABNORMAL HIGH (ref 0–37)
CO2: 22 mEq/L (ref 19–32)
Calcium: 9.5 mg/dL (ref 8.4–10.5)
Creatinine, Ser: 1.02 mg/dL (ref 0.4–1.5)
GFR calc non Af Amer: >60 mL/min (ref >60)

## 2010-08-19 LAB — CK TOTAL AND CKMB (NOT AT ARMC)
CK, MB: 8.3 ng/mL (ref 0.3–4.0)
Relative Index: 1.9 (ref 0.0–2.5)

## 2010-08-19 LAB — CBC
MCHC: 33.3 g/dL (ref 30.0–36.0)
RDW: 14.6 % (ref 11.5–15.5)

## 2010-08-19 MED ORDER — GADOBENATE DIMEGLUMINE 529 MG/ML IV SOLN
20.0000 mL | Freq: Once | INTRAVENOUS | Status: AC | PRN
  Administered 2010-08-19: 20 mL via INTRAVENOUS

## 2010-12-26 LAB — I-STAT 8, (EC8 V) (CONVERTED LAB)
Acid-Base Excess: 4 — ABNORMAL HIGH
Bicarbonate: 28.2 — ABNORMAL HIGH
HCT: 49
Operator id: 285841
pCO2, Ven: 39.4 — ABNORMAL LOW

## 2010-12-26 LAB — URINALYSIS, ROUTINE W REFLEX MICROSCOPIC
Bilirubin Urine: NEGATIVE
Glucose, UA: >1000 — AB
Ketones, ur: 15 — AB
Leukocytes, UA: NEGATIVE
pH: 5.5

## 2010-12-26 LAB — CBC
MCHC: 34.9
RBC: 5.4
RDW: 12.7

## 2010-12-26 LAB — DIFFERENTIAL
Basophils Absolute: 0.1
Basophils Relative: 2 — ABNORMAL HIGH
Monocytes Relative: 5
Neutro Abs: 5
Neutrophils Relative %: 57

## 2010-12-26 LAB — POCT I-STAT CREATININE
Creatinine, Ser: 1.2
Operator id: 285841

## 2010-12-26 LAB — URINE MICROSCOPIC-ADD ON

## 2011-01-02 ENCOUNTER — Ambulatory Visit (HOSPITAL_BASED_OUTPATIENT_CLINIC_OR_DEPARTMENT_OTHER)
Admission: RE | Admit: 2011-01-02 | Discharge: 2011-01-03 | Disposition: A | Discharge status: Home / Self Care | Payer: Medicare Other | Source: Ambulatory Visit | Attending: Orthopedic Surgery | Admitting: Orthopedic Surgery

## 2011-01-02 DIAGNOSIS — Z01812 Encounter for preprocedural laboratory examination: Secondary | ICD-10-CM | POA: Insufficient documentation

## 2011-01-02 DIAGNOSIS — S43429A Sprain of unspecified rotator cuff capsule, initial encounter: Secondary | ICD-10-CM | POA: Insufficient documentation

## 2011-01-02 DIAGNOSIS — E119 Type 2 diabetes mellitus without complications: Secondary | ICD-10-CM | POA: Insufficient documentation

## 2011-01-02 DIAGNOSIS — X58XXXA Exposure to other specified factors, initial encounter: Secondary | ICD-10-CM | POA: Insufficient documentation

## 2011-01-02 DIAGNOSIS — J449 Chronic obstructive pulmonary disease, unspecified: Secondary | ICD-10-CM | POA: Insufficient documentation

## 2011-01-02 DIAGNOSIS — M25519 Pain in unspecified shoulder: Secondary | ICD-10-CM | POA: Insufficient documentation

## 2011-01-02 DIAGNOSIS — J4489 Other specified chronic obstructive pulmonary disease: Secondary | ICD-10-CM | POA: Insufficient documentation

## 2011-01-02 DIAGNOSIS — I10 Essential (primary) hypertension: Secondary | ICD-10-CM | POA: Insufficient documentation

## 2011-01-02 HISTORY — PX: OTHER SURGICAL HISTORY: SHX169

## 2011-01-02 LAB — GLUCOSE, CAPILLARY
Glucose-Capillary: 255 mg/dL — ABNORMAL HIGH (ref 70–99)
Glucose-Capillary: 203 mg/dL — ABNORMAL HIGH (ref 70–99)

## 2011-01-03 LAB — POCT I-STAT 4, (NA,K, GLUC, HGB,HCT)
Glucose, Bld: 243 mg/dL — ABNORMAL HIGH (ref 70–99)
HCT: 38 % — ABNORMAL LOW (ref 39.0–52.0)
Hemoglobin: 12.9 g/dL — ABNORMAL LOW (ref 13.0–17.0)
Sodium: 135 mEq/L (ref 135–145)

## 2011-01-03 LAB — GLUCOSE, CAPILLARY: Glucose-Capillary: 226 mg/dL — ABNORMAL HIGH (ref 70–99)

## 2011-01-09 NOTE — Op Note (Signed)
  NAMEELMORE, HYSLOP              ACCOUNT NO.:  [id number]  MEDICAL RECORD NO.:  [id number]  LOCATION:                               FACILITY:  [facility]  PHYSICIAN:  Marlowe Kays, M.D.  DATE OF BIRTH:  1950-12-10  DATE OF PROCEDURE:  01/02/2011 DATE OF DISCHARGE:                              OPERATIVE REPORT   PREOPERATIVE DIAGNOSIS:  Recurrent rotator cuff tear, left shoulder.  POSTOPERATIVE DIAGNOSIS:  Recurrent rotator cuff tear, left shoulder.  OPERATION:  Anterior acromionectomy and repair of recurrent rotator cuff tear, left shoulder.  SURGEON:  Marlowe Kays, M.D.  ASSISTANTDruscilla Brownie. Cherlynn June.  ANESTHESIA:  General preceded by interscalene block.  FINAL JUSTIFICATION FOR PROCEDURE:  He had had rotator cuff repaired elsewhere in 2006 and he has had a 4 months history of locking in the shoulder and pain, with an MRI demonstrating a recurrent rotator cuff tear and no other substantial abnormalities to account for the pain and locking.  Mr. Angie Fava assistance was necessary to assist with arm holding retraction, handling of instruments with 3 pairs of hands required for the case.  PROCEDURE:  Prophylactic antibiotics, interscalene block by anesthesia, satisfied general anesthesia, beachchair position on the sliding frame, left shoulder girdle was prepped with DuraPrep, draped in sterile field. Time-out performed.  Vertical midline incision down to the acromion and with subperiosteal dissection using cutting cautery, we dissected fascia to expose the anterior acromion.  He had a large prominent acromion and placed a small Cobb elevator beneath the anterior portion followed by larger Cobb elevator and performed my initial anterior acromionectomy with micro saw.  I then made a substantial amount of additional decompression with combination of the saw, rongeur, and rasp.  The rotator cuff tear was identified as noted on the MRI being about 3 cm long from  front to back.  After roughening up the articular cartilage above the greater tuberosity, I went ahead and used a 4 stranded Stryker anchor, splicing the 4 strands along the rotator cuff tear, which I was unable to advance lateralward using a second row of suture after tying the first row at the tail end of the rotator cuff into the lateral humeral tissue.  This seemed to give a nice stable repair.  The rotator cuff was thinned out laterally, but was reapproximated to healthy looking tendon stump and I did not feel that any graft was indicated. Wound was then irrigated with sterile saline.  I closed the small slit in the deltoid muscle with interrupted #1 Vicryl as well as the fascia over the residual anterior acromion with the same, subcutaneous tissue was closed with 2-0 Vicryl, and staples in the skin.  Betadine, Adaptic, dry sterile dressing were applied.  He tolerated the procedure well.  He was taken to the recovery room in satisfactory condition with no known complications and minimal blood loss.          ______________________________ Marlowe Kays, M.D.     JA/MEDQ  D:  01/02/2011  T:  01/03/2011  Job:  [id number]  Electronically Signed by Marlowe Kays M.D. on 01/09/2011 11:52:03 AM

## 2011-04-19 ENCOUNTER — Other Ambulatory Visit (HOSPITAL_COMMUNITY): Payer: Self-pay | Admitting: Orthopedic Surgery

## 2011-04-19 DIAGNOSIS — M25561 Pain in right knee: Secondary | ICD-10-CM

## 2011-04-29 ENCOUNTER — Other Ambulatory Visit (HOSPITAL_COMMUNITY): Payer: Medicare Other

## 2011-04-29 ENCOUNTER — Encounter (HOSPITAL_COMMUNITY): Payer: Medicare Other

## 2011-05-06 ENCOUNTER — Other Ambulatory Visit: Payer: Self-pay | Admitting: Urology

## 2011-05-15 ENCOUNTER — Encounter (HOSPITAL_BASED_OUTPATIENT_CLINIC_OR_DEPARTMENT_OTHER): Payer: Self-pay | Admitting: *Deleted

## 2011-05-15 NOTE — Progress Notes (Signed)
NPO AFTER MN. ARRIVES AT 0915. NEEDS ISTAT. CURRENT EKG W/ CHART. WILL TAKE NEXIUM AM OF SURG. W/ SIP OF WATER.

## 2011-05-19 DIAGNOSIS — C679 Malignant neoplasm of bladder, unspecified: Secondary | ICD-10-CM

## 2011-05-19 NOTE — H&P (Signed)
History of Present Illness     Transitional cell carcinoma of the bladder:  He experienced an episode of gross hematuria while on Coumadin. His workup revealed no abnormality of the upper tract by CT scan however he was found to have a papillary tumor involving the right hemitrigone and ureteral orifice. On 07/06/10 he underwent TUR-BT which revealed a papillary transitional cell carcinoma that was low-grade and superficial (G1,Ta). He has smoked 1-1/2 packs of cigarettes a day for 46 years.  He had a single small recurrence in either 11/12 that was treated in the office with the Bugbee. Due to the small size and low grade appearance of the lesion an induction course of BCG was not initiated.   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 His PSA in 4/12 was noted to be normal at 0.92.    Interval history: He has been doing well with no new voiding complaints or hematuria.   Past Medical History Problems  1. History of  Arthritis V13.4 2. History of  Depression 311 3. History of  Diabetes Mellitus 250.00 4. History of  Diverticulitis Of Colon 562.11 5. History of  Esophageal Reflux 530.81 6. History of  Hypercholesterolemia 272.0 7. History of  Transitional Cell Carcinoma Of The Bladder V10.51  Surgical History Problems  1. History of  Cystoscopy With Fulguration Medium Lesion (2-5cm) 2. History of  Knee Replacement Right 3. History of  Knee Replacement Left 4. History of  Partial Colectomy 5. History of  Shoulder Surgery Bilateral  Current Meds 1. Accu-Chek Aviva In Vitro Strip; Therapy: (Recorded:04Apr2012) to 2. ALPRAZolam 1 MG Oral Tablet; Therapy: (Recorded:04Apr2012) to 3. Citalopram Hydrobromide 40 MG Oral Tablet; Therapy: (Recorded:04Apr2012) to 4. Fenofibrate 160 MG Oral Tablet; Therapy: (Recorded:04Apr2012) to 5. Fish  Oil CAPS; Therapy: (Recorded:04Apr2012) to 6. Flovent HFA 44 MCG/ACT Inhalation Aerosol; Therapy: (Recorded:04Apr2012) to 7. Glimepiride 1 MG Oral Tablet; Therapy: (Recorded:04Apr2012) to 8. Ketorolac Tromethamine 10 MG Oral Tablet; Therapy: 25Oct2012 to 9. Levemir FlexPen 100 UNIT/ML Subcutaneous Solution; Therapy: 02Nov2012 to 10. Lisinopril 5 MG Oral Tablet; Therapy: (Recorded:04Apr2012) to 11. Lyrica 150 MG Oral Capsule; Therapy: (Recorded:04Apr2012) to 12. MetFORMIN HCl 850 MG Oral Tablet; Therapy: (Recorded:04Apr2012) to 13. Methocarbamol 500 MG Oral Tablet; Therapy: 25Oct2012 to 14. NexIUM 40 MG Oral Capsule Delayed Release; Therapy: (Recorded:04Apr2012) to 15. Phenazopyridine HCl 200 MG Oral Tablet; Therapy: 27Apr2012 to 16. Pravastatin Sodium 40 MG Oral Tablet; Therapy: (Recorded:04Apr2012) to 17. Spiriva HandiHaler 18 MCG Inhalation Capsule; Therapy: 10Nov2012 to  Allergies Medication  1. No Known Drug Allergies  Family History Problems  1. Family history of  Death In The Family Mother 4; stroke 2. Family history of  Family Health Status - Father's Age 28 3. Family history of  Family Health Status Children ___ Living Daughters 2 4. Family history of  Family Health Status Children ___ Living Sons 2 5. Maternal history of  Stroke Syndrome V17.1  Social History Problems  1. Caffeine Use 3-4 2. Former Smoker V15.82 smoked 1.5 ppd for 46 years; quit 2 months ago 3. Marital History - Currently Married 4. Retired From Work Denied  5. History of  Alcohol Use Review of Systems Genitourinary, constitutional, skin, eye, otolaryngeal, hematologic/lymphatic, cardiovascular, pulmonary, endocrine, musculoskeletal, gastrointestinal, neurological and psychiatric system(s) were reviewed and pertinent findings if present are noted.  Genitourinary: nocturia, hematuria and erectile dysfunction.  Gastrointestinal: heartburn.  Constitutional: feeling tired (fatigue).  Eyes: blurred  vision.  Musculoskeletal: back pain and joint pain.    Vitals All Vitals Blood Pressure  Blood Pressure: 118 / 68 Temperature  Temperature: 98.2 F Pulse  Heart Rate: 80 Height Weight BMI BSA  Height: 5 ft 9 in Weight: 250 lb  BMI: 36.92 kg/m2 BSA: 2.27 m2  Physical Exam Constitutional: Well nourished and well developed. No acute distress. The patient appears well hydrated.  ENT:. The ears and nose are normal in appearance.  Neck: The appearance of the neck is normal.  Pulmonary: No respiratory distress.  Cardiovascular: Heart rate and rhythm are normal.  Abdomen: The abdomen is obese. The abdomen is soft and nontender. No suprapubic tenderness. No CVA tenderness. Bowel sounds are normal. No hernias are palpable. No hepatosplenomegaly noted.  Rectal: Rectal exam demonstrates normal sphincter tone, the anus is normal on inspection. and no tenderness. Estimated prostate size is 2+. Normal rectal tone, no rectal masses, prostate is smooth, symmetric and non-tender. The prostate has no nodularity, is not indurated, is not tender and is not fluctuant. The perineum is normal on inspection, no perineal tenderness.  Genitourinary: Examination of the penis demonstrates a normal meatus. The penis is circumcised. The scrotum is normal in appearance. The right testis is palpably normal, not enlarged and non-tender. The left testis is normal, not enlarged and non-tender.  Lymphatics: The femoral and inguinal nodes are not enlarged or tender.  Skin: Normal skin turgor and normal skin color and pigmentation.  Neuro/Psych:. Mood and affect are appropriate.   Assessment Assessed  1. Transitional Cell Carcinoma Of The Bladder V10.51   We discussed the fact that I have noted what appears to be further recurrence in the bladder. They appear superficial. One does appear to be in the prostatic urethra and these need to be resected as an outpatient. I've gone over the procedure with the patient today again  as well as its risks and complications and the alternatives. I will plan to use postoperative mitomycin C. and then he will need to undergo an induction course of BCG.   Plan Health Maintenance (V70.0)  1. UA With REFLEX  Done: 21Feb2013 01:29PM PMH: Transitional Cell Carcinoma Of The Bladder (V10.51)  2. Follow-up Schedule Surgery Office  Follow-up  Requested for: 21Feb2013   1. He will be scheduled for a transurethral resection of his bladder tumor recurrence.

## 2011-05-20 ENCOUNTER — Encounter (HOSPITAL_BASED_OUTPATIENT_CLINIC_OR_DEPARTMENT_OTHER): Payer: Self-pay | Admitting: Anesthesiology

## 2011-05-20 ENCOUNTER — Encounter (HOSPITAL_BASED_OUTPATIENT_CLINIC_OR_DEPARTMENT_OTHER): Payer: Self-pay | Admitting: *Deleted

## 2011-05-20 ENCOUNTER — Encounter (HOSPITAL_BASED_OUTPATIENT_CLINIC_OR_DEPARTMENT_OTHER)
Admission: RE | Disposition: A | Discharge status: Home / Self Care | Payer: Self-pay | Source: Ambulatory Visit | Attending: Urology

## 2011-05-20 ENCOUNTER — Ambulatory Visit (HOSPITAL_BASED_OUTPATIENT_CLINIC_OR_DEPARTMENT_OTHER)
Admission: RE | Admit: 2011-05-20 | Discharge: 2011-05-20 | Disposition: A | Discharge status: Home / Self Care | Payer: Medicare Other | Source: Ambulatory Visit | Attending: Urology | Admitting: Urology

## 2011-05-20 ENCOUNTER — Ambulatory Visit (HOSPITAL_BASED_OUTPATIENT_CLINIC_OR_DEPARTMENT_OTHER): Payer: Medicare Other | Admitting: Anesthesiology

## 2011-05-20 DIAGNOSIS — E7800 Pure hypercholesterolemia, unspecified: Secondary | ICD-10-CM | POA: Diagnosis not present

## 2011-05-20 DIAGNOSIS — E119 Type 2 diabetes mellitus without complications: Secondary | ICD-10-CM | POA: Insufficient documentation

## 2011-05-20 DIAGNOSIS — C675 Malignant neoplasm of bladder neck: Secondary | ICD-10-CM | POA: Insufficient documentation

## 2011-05-20 DIAGNOSIS — C679 Malignant neoplasm of bladder, unspecified: Secondary | ICD-10-CM

## 2011-05-20 DIAGNOSIS — K219 Gastro-esophageal reflux disease without esophagitis: Secondary | ICD-10-CM | POA: Diagnosis not present

## 2011-05-20 DIAGNOSIS — Z79899 Other long term (current) drug therapy: Secondary | ICD-10-CM | POA: Insufficient documentation

## 2011-05-20 HISTORY — DX: Nocturia: R35.1

## 2011-05-20 HISTORY — DX: Unspecified osteoarthritis, unspecified site: M19.90

## 2011-05-20 HISTORY — DX: Polyneuropathy, unspecified: G62.9

## 2011-05-20 HISTORY — DX: Depression, unspecified: F32.A

## 2011-05-20 HISTORY — PX: TRANSURETHRAL RESECTION OF BLADDER TUMOR: SHX2575

## 2011-05-20 HISTORY — DX: Chronic obstructive pulmonary disease, unspecified: J44.9

## 2011-05-20 HISTORY — DX: Personal history of peptic ulcer disease: Z87.11

## 2011-05-20 HISTORY — DX: Neoplasm of unspecified behavior of bladder: D49.4

## 2011-05-20 HISTORY — DX: Gastro-esophageal reflux disease without esophagitis: K21.9

## 2011-05-20 HISTORY — DX: Major depressive disorder, single episode, unspecified: F32.9

## 2011-05-20 HISTORY — DX: Atherosclerotic heart disease of native coronary artery without angina pectoris: I25.10

## 2011-05-20 HISTORY — DX: Personal history of other diseases of the digestive system: Z87.19

## 2011-05-20 HISTORY — DX: Urgency of urination: R39.15

## 2011-05-20 LAB — POCT I-STAT 4, (NA,K, GLUC, HGB,HCT)
Glucose, Bld: 185 mg/dL — ABNORMAL HIGH (ref 70–99)
HCT: 38 % — ABNORMAL LOW (ref 39.0–52.0)
Hemoglobin: 12.9 g/dL — ABNORMAL LOW (ref 13.0–17.0)
Potassium: 4.2 mEq/L (ref 3.5–5.1)
Sodium: 139 mEq/L (ref 135–145)

## 2011-05-20 LAB — GLUCOSE, CAPILLARY: Glucose-Capillary: 157 mg/dL — ABNORMAL HIGH (ref 70–99)

## 2011-05-20 SURGERY — TURBT (TRANSURETHRAL RESECTION OF BLADDER TUMOR)
Anesthesia: General | Site: Bladder | Wound class: Clean Contaminated

## 2011-05-20 MED ORDER — MIDAZOLAM HCL 5 MG/5ML IJ SOLN
INTRAMUSCULAR | Status: DC | PRN
  Administered 2011-05-20: 1 mg via INTRAVENOUS

## 2011-05-20 MED ORDER — PROPOFOL 10 MG/ML IV EMUL
INTRAVENOUS | Status: DC | PRN
  Administered 2011-05-20: 150 mg via INTRAVENOUS
  Administered 2011-05-20 (×2): 50 mg via INTRAVENOUS

## 2011-05-20 MED ORDER — HYDROCODONE-ACETAMINOPHEN 7.5-325 MG PO TABS
1.0000 | ORAL_TABLET | Freq: Four times a day (QID) | ORAL | Status: DC | PRN
  Administered 2011-05-20: 1 via ORAL

## 2011-05-20 MED ORDER — LACTATED RINGERS IV SOLN
INTRAVENOUS | Status: DC
  Administered 2011-05-20: 13:00:00 via INTRAVENOUS

## 2011-05-20 MED ORDER — LACTATED RINGERS IV SOLN
INTRAVENOUS | Status: DC
  Administered 2011-05-20: 100 mL/h via INTRAVENOUS

## 2011-05-20 MED ORDER — SODIUM CHLORIDE 0.9 % IR SOLN
Status: DC | PRN
  Administered 2011-05-20: 6000 mL

## 2011-05-20 MED ORDER — FENTANYL CITRATE 0.05 MG/ML IJ SOLN
INTRAMUSCULAR | Status: DC | PRN
  Administered 2011-05-20: 50 ug via INTRAVENOUS
  Administered 2011-05-20: 100 ug via INTRAVENOUS
  Administered 2011-05-20: 50 ug via INTRAVENOUS

## 2011-05-20 MED ORDER — MITOMYCIN CHEMO FOR BLADDER INSTILLATION 40 MG
40.0000 mg | Freq: Once | INTRAVENOUS | Status: DC
  Filled 2011-05-20: qty 40

## 2011-05-20 MED ORDER — 0.9 % SODIUM CHLORIDE (POUR BTL) OPTIME
TOPICAL | Status: DC | PRN
  Administered 2011-05-20: 1000 mL

## 2011-05-20 MED ORDER — LIDOCAINE HCL (CARDIAC) 20 MG/ML IV SOLN
INTRAVENOUS | Status: DC | PRN
  Administered 2011-05-20: 80 mg via INTRAVENOUS

## 2011-05-20 MED ORDER — HYDROCODONE-ACETAMINOPHEN 10-300 MG PO TABS: 1.0000 | ORAL_TABLET | Freq: Four times a day (QID) | ORAL | Status: DC | PRN

## 2011-05-20 MED ORDER — PHENAZOPYRIDINE HCL 200 MG PO TABS
200.0000 mg | ORAL_TABLET | Freq: Once | ORAL | Status: AC
  Administered 2011-05-20: 200 mg via ORAL

## 2011-05-20 MED ORDER — PHENAZOPYRIDINE HCL 200 MG PO TABS: 200.0000 mg | ORAL_TABLET | Freq: Three times a day (TID) | ORAL | Status: AC | PRN

## 2011-05-20 MED ORDER — CIPROFLOXACIN IN D5W 200 MG/100ML IV SOLN
200.0000 mg | INTRAVENOUS | Status: AC
  Administered 2011-05-20: 200 mg via INTRAVENOUS

## 2011-05-20 MED ORDER — BELLADONNA ALKALOIDS-OPIUM 16.2-60 MG RE SUPP
RECTAL | Status: DC | PRN
  Administered 2011-05-20: 1 via RECTAL

## 2011-05-20 MED ORDER — ONDANSETRON HCL 4 MG/2ML IJ SOLN
INTRAMUSCULAR | Status: DC | PRN
  Administered 2011-05-20: 4 mg via INTRAVENOUS

## 2011-05-20 MED ORDER — PROMETHAZINE HCL 25 MG/ML IJ SOLN: 6.2500 mg | INTRAMUSCULAR | Status: DC | PRN

## 2011-05-20 MED ORDER — FENTANYL CITRATE 0.05 MG/ML IJ SOLN: 25.0000 ug | INTRAMUSCULAR | Status: DC | PRN

## 2011-05-20 MED ORDER — MITOMYCIN CHEMO FOR BLADDER INSTILLATION 40 MG
INTRAVENOUS | Status: DC | PRN
  Administered 2011-05-20: 40 mg via INTRAVESICAL

## 2011-05-20 SURGICAL SUPPLY — 35 items
BAG DRAIN URO-CYSTO SKYTR STRL (DRAIN) ×2 IMPLANT
BAG DRN ANRFLXCHMBR STRAP LEK (BAG)
BAG DRN UROCATH (DRAIN) ×1
BAG URINE DRAINAGE (UROLOGICAL SUPPLIES) IMPLANT
BAG URINE LEG 19OZ MD ST LTX (BAG) IMPLANT
CANISTER SUCT LVC 12 LTR MEDI- (MISCELLANEOUS) ×2 IMPLANT
CATH FOLEY 2WAY SLVR  5CC 20FR (CATHETERS) ×1
CATH FOLEY 2WAY SLVR  5CC 22FR (CATHETERS)
CATH FOLEY 2WAY SLVR  5CC 24FR (CATHETERS)
CATH FOLEY 2WAY SLVR 5CC 20FR (CATHETERS) IMPLANT
CATH FOLEY 2WAY SLVR 5CC 22FR (CATHETERS) IMPLANT
CATH FOLEY 2WAY SLVR 5CC 24FR (CATHETERS) ×1 IMPLANT
CLOTH BEACON ORANGE TIMEOUT ST (SAFETY) ×2 IMPLANT
DRAPE CAMERA CLOSED 9X96 (DRAPES) ×2 IMPLANT
ELECT BUTTON BIOP 24F 90D PLAS (MISCELLANEOUS) IMPLANT
ELECT LOOP HF 26F 30D .35MM (CUTTING LOOP) IMPLANT
ELECT LOOP MED HF 24F 12D CBL (CLIP) ×2 IMPLANT
ELECT REM PT RETURN 9FT ADLT (ELECTROSURGICAL)
ELECT RESECT VAPORIZE 12D CBL (ELECTRODE) ×1 IMPLANT
ELECTRODE REM PT RTRN 9FT ADLT (ELECTROSURGICAL) ×1 IMPLANT
EVACUATOR MICROVAS BLADDER (UROLOGICAL SUPPLIES) ×1 IMPLANT
GLOVE BIO SURGEON STRL SZ8 (GLOVE) ×2 IMPLANT
GLOVE BIOGEL PI IND STRL 6.5 (GLOVE) IMPLANT
GLOVE BIOGEL PI INDICATOR 6.5 (GLOVE) ×2
GLOVE ECLIPSE 6.0 STRL STRAW (GLOVE) ×1 IMPLANT
GOWN PREVENTION PLUS LG XLONG (DISPOSABLE) ×2 IMPLANT
GOWN STRL REIN XL XLG (GOWN DISPOSABLE) ×2 IMPLANT
HOLDER FOLEY CATH W/STRAP (MISCELLANEOUS) IMPLANT
IV NS IRRIG 3000ML ARTHROMATIC (IV SOLUTION) ×2 IMPLANT
KIT ASPIRATION TUBING (SET/KITS/TRAYS/PACK) ×1 IMPLANT
LOOP CUTTING 24FR OLYMPUS (CUTTING LOOP) IMPLANT
PACK CYSTOSCOPY (CUSTOM PROCEDURE TRAY) ×2 IMPLANT
PLUG CATH AND CAP STER (CATHETERS) ×1 IMPLANT
SET ASPIRATION TUBING (TUBING) IMPLANT
WATER STERILE IRR 3000ML UROMA (IV SOLUTION) IMPLANT

## 2011-05-20 NOTE — Op Note (Signed)
PATIENT:  Michael Horn  PRE-OPERATIVE DIAGNOSIS: Bladder tumor  POST-OPERATIVE DIAGNOSIS: Same  PROCEDURE:  Procedure(s): TRANSURETHRAL RESECTION OF BLADDER TUMOR (TURBT) (1.0cm.)  SURGEON:  Surgeon(s): Garnett Farm  ANESTHESIA:   General  EBL:  Minimal  DRAINS: Urinary Catheter (20 Fr. Foley)   SPECIMEN:  Source of Specimen: 1. Bladder tumor from bladder neck/prostatic urethra. 2. Bladder tumor from the floor of the bladder on left side.  DISPOSITION OF SPECIMEN:  PATHOLOGY  Indication: Michael Horn is a 61 year old male patient who initially underwent TURBT in 4/12 for what was found to be a papillary low-grade tumor that was superficial (Ta,G1). He then had a single small recurrence 11/12 that was treated with the Bugbee in the office. At the time of routine surveillance cystoscopy he was found to have a bladder tumor that had recurred at the bladder neck and therefore is brought to the operating room today for resection of the tumor.  Description of operation: The patient was taken to the operating room and administered general anesthesia. He was then placed on the table and moved to the dorsal lithotomy position after which his genitalia was sterilely prepped and draped. An official timeout was then performed.  Cystoscopy was performed initially with a 22 French cystoscope and 12 lens. The urethra was noted be normal. The sphincter was intact and upon entering the prostatic urethra I noted a small tumor on the right side of the prostatic urethra just inside the bladder neck as well as papillary tumor involving the 12:00 position. This was photographed. I then entered the bladder and noted what appeared to be a papillary tumor posterior to the left ureteral orifice, involving the left ureteral orifice and then a small papillary tumor anterior to the ureteral orifice as well. The 70 lens was then used and I noted no additional tumors. The ureteral orifices were of normal position.  The right orifice did have some postsurgical changes but was widely patent and efluxed clear urine.  I removed the cystoscope and proceeded with placement of the 26 French resectoscope with Timberlake obturator. This was then introduced into the bladder and the obturator was removed. The resectoscope element with 12 lens was then inserted and the bladder was fully and systematically inspected once again with no additional findings being noted.  I first began by resecting the tumor at the bladder neck. I resected the small tumor on the right-hand side and obtained prostatic tissue in order to evaluate for invasion. This portion of the specimen was then removed with the Microvasive evacuator and sent to pathology separately from the bladder tumor noted on the left floor. I then resected the tumors located on the left floor and actually resected the left orifice intentionally. This was done with pure cut and no fulguration was performed in the area of the ureter. I fulgurated the mucosa surrounding the area of resection and then sent this tumor to pathology separately. Reinspection of the bladder revealed all obvious tumor had been fully resected and there was no evidence of perforation. I then removed the resectoscope.  A 20 French Foley catheter was then inserted in the bladder and irrigated. The irrigant returned slightly pink with no clots. I then instilled 40 mg of mitomycin-C in 40 cc of water and plugged the catheter. This will remain indwelling for approximately one hour. It will then be drained from the bladder and the catheter will be removed and the patient discharged home.  PLAN OF CARE: Discharge to home after PACU  PATIENT DISPOSITION:  PACU - hemodynamically stable.

## 2011-05-20 NOTE — Anesthesia Preprocedure Evaluation (Addendum)
Anesthesia Evaluation  Patient identified by MRN, date of birth, ID band Patient awake    Reviewed: Allergy & Precautions, H&P , NPO status , Patient's Chart, lab work & pertinent test results  History of Anesthesia Complications (+) Emergence Delirium  Airway Mallampati: II TM Distance: >3 FB Neck ROM: Full    Dental  (+) Edentulous Lower, Partial Upper and Dental Advisory Given   Pulmonary sleep apnea , COPDCurrent Smoker,  breath sounds clear to auscultation        Cardiovascular hypertension, + CAD Rhythm:Regular Rate:Normal     Neuro/Psych PSYCHIATRIC DISORDERS Depression  Neuromuscular disease    GI/Hepatic Neg liver ROS, hiatal hernia, GERD-  Medicated,  Endo/Other  Diabetes mellitus-, Type 2, Oral Hypoglycemic AgentsMorbid obesity  Renal/GU negative Renal ROS     Musculoskeletal negative musculoskeletal ROS (+)   Abdominal (+) + obese,   Peds  Hematology negative hematology ROS (+)   Anesthesia Other Findings   Reproductive/Obstetrics                          Anesthesia Physical Anesthesia Plan  ASA: III  Anesthesia Plan: General   Post-op Pain Management:    Induction: Intravenous  Airway Management Planned: LMA  Additional Equipment:   Intra-op Plan:   Post-operative Plan:   Informed Consent: I have reviewed the patients History and Physical, chart, labs and discussed the procedure including the risks, benefits and alternatives for the proposed anesthesia with the patient or authorized representative who has indicated his/her understanding and acceptance.   Dental advisory given  Plan Discussed with: CRNA  Anesthesia Plan Comments:         Anesthesia Quick Evaluation

## 2011-05-20 NOTE — Anesthesia Procedure Notes (Signed)
Procedure Name: LMA Insertion Performed by: Briant Sites Pre-anesthesia Checklist: Patient identified, Emergency Drugs available, Suction available and Patient being monitored Patient Re-evaluated:Patient Re-evaluated prior to inductionOxygen Delivery Method: Circle System Utilized Preoxygenation: Pre-oxygenation with 100% oxygen Intubation Type: IV induction Ventilation: Mask ventilation without difficulty LMA: LMA inserted LMA Size: 5.0 Number of attempts: 2 Placement Confirmation: positive ETCO2 Tube secured with: Tape Dental Injury: Teeth and Oropharynx as per pre-operative assessment

## 2011-05-20 NOTE — Interval H&P Note (Signed)
History and Physical Interval Note:  05/20/2011 9:44 AM  Michael Horn  has presented today for surgery, with the diagnosis of BLADDER TUMOR  The various methods of treatment have been discussed with the patient and family. After consideration of risks, benefits and other options for treatment, the patient has consented to  Procedure(s) (LRB): TRANSURETHRAL RESECTION OF BLADDER TUMOR (TURBT) (N/A) as a surgical intervention .  The patients' history has been reviewed, patient examined, no change in status, stable for surgery.  I have reviewed the patients' chart and labs.  Questions were answered to the patient's satisfaction.     Garnett Farm

## 2011-05-20 NOTE — Anesthesia Postprocedure Evaluation (Signed)
Anesthesia Post Note  Patient: Michael Horn  Procedure(s) Performed: Procedure(s) (LRB): TRANSURETHRAL RESECTION OF BLADDER TUMOR (TURBT) (N/A)  Anesthesia type: General  Patient location: PACU  Post pain: Pain level controlled  Post assessment: Post-op Vital signs reviewed  Last Vitals:  Filed Vitals:   05/20/11 1115  BP:   Pulse: 80  Temp:   Resp: 10    Post vital signs: Reviewed  Level of consciousness: sedated  Complications: No apparent anesthesia complications

## 2011-05-20 NOTE — Transfer of Care (Signed)
Immediate Anesthesia Transfer of Care Note  Patient: Michael Horn  Procedure(s) Performed: Procedure(s) (LRB): TRANSURETHRAL RESECTION OF BLADDER TUMOR (TURBT) (N/A)  Patient Location: PACU  Anesthesia Type: General  Level of Consciousness: awake and alert   Airway & Oxygen Therapy: Patient Spontanous Breathing and Patient connected to face mask oxygen  Post-op Assessment: Report given to PACU RN  Post vital signs: Reviewed and stable  Complications: No apparent anesthesia complications

## 2011-05-20 NOTE — Discharge Instructions (Addendum)
Transurethral Resection of Bladder Tumor (TURBT)   Definition:  Transurethral Resection of the Bladder Tumor is a surgical procedure used to diagnose and remove tumors within the bladder. TURBT is the most common treatment for early stage bladder cancer.  General instructions:     Your recent bladder surgery requires very little post hospital care but some definite precautions.  Despite the fact that no skin incisions were used, the area around the bladder incisions are raw and covered with scabs to promote healing and prevent bleeding. Certain precautions are needed to insure that the scabs are not disturbed over the next 2-4 weeks while the healing proceeds.  Because the raw surface inside your bladder and the irritating effects of urine you may expect frequency of urination and/or urgency (a stronger desire to urinate) and perhaps even getting up at night more often. This will usually resolve or improve slowly over the healing period. You may see some blood in your urine over the first 6 weeks. Do not be alarmed, even if the urine was clear for a while. Get off your feet and drink lots of fluids until clearing occurs. If you start to pass clots or don't improve call us.  Catheter: (If you are discharged with a catheter.)  1. Keep your catheter secured to your leg at all times with tape or the supplied strap. 2. You may experience leakage of urine around your catheter- as long as the  catheter continues to drain, this is normal.  If your catheter stops draining  go to the ER. 3. You may also have blood in your urine, even after it has been clear for  several days; you may even pass some small blood clots or other material.  This  is normal as well.  If this happens, sit down and drink plenty of water to help  make urine to flush out your bladder.  If the blood in your urine becomes worse  after doing this, contact our office or return to the ER. 4. You may use the leg bag (small bag)  during the day, but use the large bag at  night.  Diet:  You may return to your normal diet immediately. Because of the raw surface of your bladder, alcohol, spicy foods, foods high in acid and drinks with caffeine may cause irritation or frequency and should be used in moderation. To keep your urine flowing freely and avoid constipation, drink plenty of fluids during the day (8-10 glasses). Tip: Avoid cranberry juice because it is very acidic.  Activity:  Your physical activity doesn't need to be restricted. However, if you are very active, you may see some blood in the urine. We suggest that you reduce your activity under the circumstances until the bleeding has stopped.  Bowels:  It is important to keep your bowels regular during the postoperative period. Straining with bowel movements can cause bleeding. A bowel movement every other day is reasonable. Use a mild laxative if needed, such as milk of magnesia 2-3 tablespoons, or 2 Dulcolax tablets. Call if you continue to have problems. If you had been taking narcotics for pain, before, during or after your surgery, you may be constipated. Take a laxative if necessary.    Medication:  You should resume your pre-surgery medications unless told not to. In addition you may be given an antibiotic to prevent or treat infection. Antibiotics are not always necessary. All medication should be taken as prescribed until the bottles are finished unless you are having   an unusual reaction to one of the drugs.   Transurethral Resection, Bladder Tumor A cancerous growth (tumor) can develop on the inside wall of the bladder. The bladder is the organ that holds urine. One way to remove the tumor is a procedure called a transurethral resection. The tumor is removed (resected) through the tube that carries urine from the bladder out of the body (urethra). No cuts (incisions) are made in the skin. Instead, the procedure is done through a thin telescope, called a  resectoscope. Attached to it is a light and usually a tiny camera. The resectoscope is put into the urethra. In men, the urethra opens at the end of the penis. In women, it opens just above the vagina.  A transurethral resection is usually used to remove tumors that have not gotten too big or too deep. These are called Stage 0, Stage 1 or Stage 2 bladder cancers. LET YOUR CAREGIVER KNOW ABOUT:  On the day of the procedure, your caregivers will need to know the last time you had anything to eat or drink. This includes water, gum, and candy. In advance, make sure they know about:   Any allergies.   All medications you are taking, including:   Herbs, eyedrops, over-the-counter medications and creams.   Blood thinners (anticoagulants), aspirin or other drugs that could affect blood clotting.   Use of steroids (by mouth or as creams).   Previous problems with anesthetics, including local anesthetics.   Possibility of pregnancy, if this applies.   Any history of blood clots.   Any history of bleeding or other blood problems.   Previous surgery.   Smoking history.   Any recent symptoms of colds or infections.   Other health problems.  RISKS AND COMPLICATIONS This is usually a safe procedure. Every procedure has risks, though. For a transurethral resection, they include:  Infection. Antibiotic medication would need to be taken.   Bleeding.   Light bleeding may last for several days after the procedure.   If bleeding continues or is heavy, the bladder may need rinsing. Or, a new catheter might be put in for awhile.   Sometimes bed rest is needed.   Urination problems.   Pain and burning can occur when urinating. This usually goes away in a few days.   Scarring from the procedure can block the flow of urine.   Bladder damage.   It can be punctured or torn during removal of the tumor. If this happens, a catheter might be needed for longer. Antibiotics would be taken while the  bladder heals.   Urine can leak through the hole or tear into the abdomen. If this happens, surgery may be needed to repair the bladder.  BEFORE THE PROCEDURE   A medical evaluation will be done. This may include:   A physical examination.   Urine test. This is to make sure you do not have a urinary tract infection.   Blood tests.   A test that checks the heart's rhythm (electrocardiogram).   Talking with an anesthesiologist. This is the person who will be in charge of the medication (anesthesia) to keep you from feeling pain during the transurethral resection. You might be asleep during the procedure (general anesthesia) or numb from the waist down, but awake during the procedure (spinal anesthesia). Ask your surgeon what to expect.   The person who is having a transurethral resection needs to give what is called informed consent. This requires signing a legal paper that gives permission  for the procedure. To give informed consent:   You must understand how the procedure is done and why.   You must be told all the risks and benefits of the procedure.   You must sign the consent. Sometimes a legal guardian can do this.   Signing should be witnessed by a healthcare professional.   The day before the surgery, eat only a light dinner. Then, do not eat or drink anything for at least 8 hours before the surgery. Ask your caregiver if it is OK to take any needed medicines with a sip of water.   Arrive at least an hour before the surgery or whenever your surgeon recommends. This will give you time to check in and fill out any needed paperwork.  PROCEDURE  The preparation:   You will change into a hospital gown.   A needle will be inserted in your arm. This is an intravenous access tube (IV). Medication will be able to flow directly into your body through this needle.   Small monitors will be put on your body. They are used to check your heart, blood pressure, and oxygen level.   You  might be given medication that will help you relax (sedative).   You will be given a general anesthetic or spinal anesthesia.   The procedure:   Once you are asleep or numb from the waist down, your legs will be placed in stirrups.   The resectoscope will be passed through the urethra into the bladder.   Fluid will be passed through the resectoscope. This will fill the bladder with water.   The surgeon will examine the bladder through the scope. If the scope has a camera, it can take pictures from inside the bladder. They can be projected onto a TV screen.   The surgeon will use various tools to remove the tumor in small pieces. Sometimes a laser (a beam of light energy) is used. Other tools may use electric current.   A tube (catheter) will often be placed so that urine can drain into a bag outside the body. This process helps stop bleeding. This tube keeps blood clots from blocking the urethra.   The procedure usually takes 30 to 45 minutes.  AFTER THE PROCEDURE   You will stay in a recovery area until the anesthesia has worn off. Your blood pressure and pulse will be checked every so often. Then you will be taken to a hospital room.   You may continue to get fluids through the IV for awhile.   Some pain is normal. The catheter might be uncomfortable. Pain is usually not severe. If it is, ask for pain medicine.   Your urine may look bloody after a transurethral resection. This is normal.   If bleeding is heavy, a hospital caregiver may rinse out the bladder (irrigation) through the catheter.   Once the urine is clear, the catheter will be taken out.   You will need to stay in the hospital until you can urinate on your own.   Most people stay in the hospital for up to 4 days.  PROGNOSIS   Transurethral resection is considered the best way to treat bladder tumors that are not too far along. For most people, the treatment is successful. Sometimes, though, more treatment is  needed.   Bladder cancers can come back even after a successful procedure. Because of this, be sure to have a checkup with your caregiver every 3 to 6 months. If everything is OK  for 3 years, you can reduce the checkups to once a year.  Document Released: 12/22/2008 Document Revised: 02/14/2011 Document Reviewed: 12/22/2008 [facility] Patient Information 2012 [place], [place].Transurethral Resection, Bladder Tumor A cancerous growth (tumor) can develop on the inside wall of the bladder. The bladder is the organ that holds urine. One way to remove the tumor is a procedure called a transurethral resection. The tumor is removed (resected) through the tube that carries urine from the bladder out of the body (urethra). No cuts (incisions) are made in the skin. Instead, the procedure is done through a thin telescope, called a resectoscope. Attached to it is a light and usually a tiny camera. The resectoscope is put into the urethra. In men, the urethra opens at the end of the penis. In women, it opens just above the vagina.  A transurethral resection is usually used to remove tumors that have not gotten too big or too deep. These are called Stage 0, Stage 1 or Stage 2 bladder cancers. LET YOUR CAREGIVER KNOW ABOUT:  On the day of the procedure, your caregivers will need to know the last time you had anything to eat or drink. This includes water, gum, and candy. In advance, make sure they know about:   Any allergies.   All medications you are taking, including:   Herbs, eyedrops, over-the-counter medications and creams.   Blood thinners (anticoagulants), aspirin or other drugs that could affect blood clotting.   Use of steroids (by mouth or as creams).   Previous problems with anesthetics, including local anesthetics.   Possibility of pregnancy, if this applies.   Any history of blood clots.   Any history of bleeding or other blood problems.   Previous surgery.   Smoking history.   Any recent  symptoms of colds or infections.   Other health problems.  RISKS AND COMPLICATIONS This is usually a safe procedure. Every procedure has risks, though. For a transurethral resection, they include:  Infection. Antibiotic medication would need to be taken.   Bleeding.   Light bleeding may last for several days after the procedure.   If bleeding continues or is heavy, the bladder may need rinsing. Or, a new catheter might be put in for awhile.   Sometimes bed rest is needed.   Urination problems.   Pain and burning can occur when urinating. This usually goes away in a few days.   Scarring from the procedure can block the flow of urine.   Bladder damage.   It can be punctured or torn during removal of the tumor. If this happens, a catheter might be needed for longer. Antibiotics would be taken while the bladder heals.   Urine can leak through the hole or tear into the abdomen. If this happens, surgery may be needed to repair the bladder.  BEFORE THE PROCEDURE   A medical evaluation will be done. This may include:   A physical examination.   Urine test. This is to make sure you do not have a urinary tract infection.   Blood tests.   A test that checks the heart's rhythm (electrocardiogram).   Talking with an anesthesiologist. This is the person who will be in charge of the medication (anesthesia) to keep you from feeling pain during the transurethral resection. You might be asleep during the procedure (general anesthesia) or numb from the waist down, but awake during the procedure (spinal anesthesia). Ask your surgeon what to expect.   The person who is having a transurethral resection needs  to give what is called informed consent. This requires signing a legal paper that gives permission for the procedure. To give informed consent:   You must understand how the procedure is done and why.   You must be told all the risks and benefits of the procedure.   You must sign the  consent. Sometimes a legal guardian can do this.   Signing should be witnessed by a healthcare professional.   The day before the surgery, eat only a light dinner. Then, do not eat or drink anything for at least 8 hours before the surgery. Ask your caregiver if it is OK to take any needed medicines with a sip of water.   Arrive at least an hour before the surgery or whenever your surgeon recommends. This will give you time to check in and fill out any needed paperwork.  PROCEDURE  The preparation:   You will change into a hospital gown.   A needle will be inserted in your arm. This is an intravenous access tube (IV). Medication will be able to flow directly into your body through this needle.   Small monitors will be put on your body. They are used to check your heart, blood pressure, and oxygen level.   You might be given medication that will help you relax (sedative).   You will be given a general anesthetic or spinal anesthesia.   The procedure:   Once you are asleep or numb from the waist down, your legs will be placed in stirrups.   The resectoscope will be passed through the urethra into the bladder.   Fluid will be passed through the resectoscope. This will fill the bladder with water.   The surgeon will examine the bladder through the scope. If the scope has a camera, it can take pictures from inside the bladder. They can be projected onto a TV screen.   The surgeon will use various tools to remove the tumor in small pieces. Sometimes a laser (a beam of light energy) is used. Other tools may use electric current.   A tube (catheter) will often be placed so that urine can drain into a bag outside the body. This process helps stop bleeding. This tube keeps blood clots from blocking the urethra.   The procedure usually takes 30 to 45 minutes.  AFTER THE PROCEDURE   You will stay in a recovery area until the anesthesia has worn off. Your blood pressure and pulse will be  checked every so often. Then you will be taken to a hospital room.   You may continue to get fluids through the IV for awhile.   Some pain is normal. The catheter might be uncomfortable. Pain is usually not severe. If it is, ask for pain medicine.   Your urine may look bloody after a transurethral resection. This is normal.   If bleeding is heavy, a hospital caregiver may rinse out the bladder (irrigation) through the catheter.   Once the urine is clear, the catheter will be taken out.   You will need to stay in the hospital until you can urinate on your own.   Most people stay in the hospital for up to 4 days.  PROGNOSIS   Transurethral resection is considered the best way to treat bladder tumors that are not too far along. For most people, the treatment is successful. Sometimes, though, more treatment is needed.   Bladder cancers can come back even after a successful procedure. Because of this, be sure  to have a checkup with your caregiver every 3 to 6 months. If everything is OK for 3 years, you can reduce the checkups to once a year.  Document Released: 12/22/2008 Document Revised: 02/14/2011 Document Reviewed: 12/22/2008 [facility] Patient Information 2012 [place], [place].

## 2011-05-21 ENCOUNTER — Encounter (HOSPITAL_BASED_OUTPATIENT_CLINIC_OR_DEPARTMENT_OTHER): Payer: Self-pay | Admitting: Urology

## 2012-01-22 ENCOUNTER — Other Ambulatory Visit: Payer: Self-pay | Admitting: Urology

## 2012-01-27 ENCOUNTER — Encounter (HOSPITAL_BASED_OUTPATIENT_CLINIC_OR_DEPARTMENT_OTHER): Payer: Self-pay | Admitting: *Deleted

## 2012-01-27 NOTE — Progress Notes (Signed)
NPO AFTER MN. ARRIVES AT 0845. NEEDS ISTAT AND EKG. WILL TAKE NEXIUM AM OF SURG W/ SIP OF WATER.  REQUESTED LOV NOTE FROM DR Eldridge Dace.

## 2012-01-30 NOTE — H&P (Signed)
History of Present Illness   Transitional cell carcinoma of the bladder:  He experienced an episode of gross hematuria while on Coumadin. His workup revealed no abnormality of the upper tract by CT scan however he was found to have a papillary tumor involving the right hemitrigone and ureteral orifice. 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 He has smoked 1-1/2 packs of cigarettes a day for 46 years.  He had a single small recurrence in either 11/12 that was treated in the office with the [place]. Due to the small size and low grade appearance of the lesion an induction course of BCG was not initiated.  He had a recurrence and underwent TURBT on 05/20/11. His recurrence was at the bladder neck and also involved the left ureteral orifice but was found to be Ta,G1. He underwent a 6 week induction course of intravesical BCG which she completed in 5/13.   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 His PSA in 4/12 was noted to be normal at 0.92.    Interval history: He has been doing well with no new voiding complaints or hematuria.   Past Medical History Problems  1. History of  Arthritis V13.4 2. History of  Depression 311 3. History of  Diabetes Mellitus 250.00 4. History of  Diverticulitis Of Colon 562.11 5. History of  Esophageal Reflux 530.81 6. History of  Hypercholesterolemia 272.0 7. Transitional Cell Carcinoma Of The Bladder 188.9  Surgical History Problems  1. History of  Cystoscopy With Fulguration Medium Lesion (2-5cm) 2. History of  Cystoscopy With Fulguration Small Lesion (5-73mm) 3. History of  Knee Replacement Right 4. History of  Knee Replacement Left 5. History of  Partial Colectomy 6. History of  Rotator Cuff Repair 7.  History of  Shoulder Surgery Bilateral  Current Meds 1. Accu-Chek Aviva In Vitro Strip; Therapy: (Recorded:04Apr2012) to 2. ALPRAZolam 1 MG Oral Tablet; Therapy: (Recorded:04Apr2012) to 3. Cialis 5 MG Oral Tablet; Therapy: (Recorded:21Mar2013) to 4. Citalopram Hydrobromide 40 MG Oral Tablet; Therapy: (Recorded:04Apr2012) to 5. Fenofibrate 160 MG Oral Tablet; Therapy: (Recorded:04Apr2012) to 6. Fish Oil CAPS; Therapy: (Recorded:04Apr2012) to 7. Flovent HFA 44 MCG/ACT Inhalation Aerosol; Therapy: (Recorded:04Apr2012) to 8. Glimepiride 1 MG Oral Tablet; Therapy: (Recorded:04Apr2012) to 9. Ketorolac Tromethamine 10 MG Oral Tablet; Therapy: 25Oct2012 to 10. Levemir FlexPen 100 UNIT/ML Subcutaneous Solution; Therapy: 02Nov2012 to 11. Lisinopril 5 MG Oral Tablet; Therapy: (Recorded:04Apr2012) to 12. Lyrica 150 MG Oral Capsule; Therapy: (Recorded:04Apr2012) to 13. MetFORMIN HCl 850 MG Oral Tablet; Therapy: (Recorded:04Apr2012) to 14. Methocarbamol 500 MG Oral Tablet; Therapy: (Recorded:21Mar2013) to 15. NexIUM 40 MG Oral Capsule Delayed Release; Therapy: (Recorded:04Apr2012) to 16. Percocet 10-325 MG Oral Tablet; Therapy: (Recorded:21Mar2013) to 17. Phenazopyridine HCl 200 MG Oral Tablet; TAKE 1 TABLET BY MOUTH   EVERY 6 HOURS AS   NEEDED  FOR BLADDER PAIN; Therapy: 18Apr2013 to (Evaluate:02Jun2013)  Requested for:   18Apr2013; Last Rx:18Apr2013 18. Pravastatin Sodium 40 MG Oral Tablet; Therapy: (Recorded:04Apr2012) to 19. Spiriva HandiHaler 18 MCG Inhalation Capsule; Therapy: 10Nov2012 to 20. TraMADol HCl 50 MG Oral Tablet; Therapy: 14Jan2013 to  Allergies Medication  1. No Known Drug Allergies  Family History Problems  1. Family history of  Death In The Family Mother 73; stroke 2. Family history of  Family Health Status - Father's  Age 17 3. Family history of  Family Health Status Children ___ Living Daughters 2 4. Family history of  Family Health Status Children ___ Living Sons 2 5.  Maternal history of  Stroke Syndrome V17.1  Social History Problems  1. Caffeine Use 3-4 2. Former Smoker V15.82 smoked 1.5 ppd for 46 years; quit 2 months ago 3. Marital History - Currently Married 4. Retired From Work Denied  5. History of  Alcohol Use  Review of Systems Genitourinary, constitutional, skin, eye, otolaryngeal, hematologic/lymphatic, cardiovascular, pulmonary, endocrine, musculoskeletal, gastrointestinal, neurological and psychiatric system(s) were reviewed and pertinent findings if present are noted.  Genitourinary: nocturia, hematuria and erectile dysfunction.  Gastrointestinal: heartburn.  Constitutional: feeling tired (fatigue).  Eyes: blurred vision.  Musculoskeletal: back pain and joint pain.    Vitals Blood Pressure  Blood Pressure: 118 / 68 Temperature  Temperature: 98.2 F Pulse  Heart Rate: 80 Height Weight BMI BSA  Height: 5 ft 9 in Weight: 250 lb  BMI: 36.92 kg/m2 BSA: 2.27 m2  Physical Exam Constitutional: Well nourished and well developed. No acute distress. The patient appears well hydrated.  ENT:. The ears and nose are normal in appearance.  Neck: The appearance of the neck is normal.  Pulmonary: No respiratory distress.  Cardiovascular: Heart rate and rhythm are normal.  Abdomen: The abdomen is obese. The abdomen is soft and nontender. No suprapubic tenderness. No CVA tenderness. Bowel sounds are normal. No hernias are palpable. No hepatosplenomegaly noted.  Rectal: Rectal exam demonstrates normal sphincter tone, the anus is normal on inspection. and no tenderness. Estimated prostate size is 2+. Normal rectal tone, no rectal masses, prostate is smooth, symmetric and non-tender. The prostate has no nodularity, is not indurated, is not tender and is not fluctuant. The perineum is normal on inspection, no perineal tenderness.  Genitourinary: Examination of the penis demonstrates a normal meatus. The penis is circumcised. The scrotum is normal in  appearance. The right testis is palpably normal, not enlarged and non-tender. The left testis is normal, not enlarged and non-tender.  Lymphatics: The femoral and inguinal nodes are not enlarged or tender.  Skin: Normal skin turgor and normal skin color and pigmentation.  Neuro/Psych:. Mood and affect are appropriate.   Assessment Assessed  1. Transitional Cell Carcinoma Of The Bladder 188.9          He had what appeared to be 2 papillary tumors that have developed just to the left of midline at the dome of the bladder. There grouped together. They appear to be low-grade. He also appears superficial. They need to be resected. I've gone over the procedure with him again today. We again discussed the risks and complications. I also will plan to use a postoperative mitomycin-C.   Plan    He is scheduled for TURBT and postoperative instillation of mitomycin-C.

## 2012-01-31 ENCOUNTER — Encounter (HOSPITAL_BASED_OUTPATIENT_CLINIC_OR_DEPARTMENT_OTHER): Payer: Self-pay | Admitting: Anesthesiology

## 2012-01-31 ENCOUNTER — Encounter (HOSPITAL_BASED_OUTPATIENT_CLINIC_OR_DEPARTMENT_OTHER): Payer: Self-pay

## 2012-01-31 ENCOUNTER — Ambulatory Visit (HOSPITAL_BASED_OUTPATIENT_CLINIC_OR_DEPARTMENT_OTHER)
Admission: RE | Admit: 2012-01-31 | Discharge: 2012-01-31 | Disposition: A | Discharge status: Home / Self Care | Payer: Medicare Other | Source: Ambulatory Visit | Attending: Urology | Admitting: Urology

## 2012-01-31 ENCOUNTER — Ambulatory Visit (HOSPITAL_BASED_OUTPATIENT_CLINIC_OR_DEPARTMENT_OTHER): Payer: Medicare Other | Admitting: Anesthesiology

## 2012-01-31 ENCOUNTER — Other Ambulatory Visit: Payer: Self-pay

## 2012-01-31 ENCOUNTER — Encounter (HOSPITAL_BASED_OUTPATIENT_CLINIC_OR_DEPARTMENT_OTHER)
Admission: RE | Disposition: A | Discharge status: Home / Self Care | Payer: Self-pay | Source: Ambulatory Visit | Attending: Urology

## 2012-01-31 DIAGNOSIS — J4489 Other specified chronic obstructive pulmonary disease: Secondary | ICD-10-CM | POA: Insufficient documentation

## 2012-01-31 DIAGNOSIS — Z01810 Encounter for preprocedural cardiovascular examination: Secondary | ICD-10-CM | POA: Insufficient documentation

## 2012-01-31 DIAGNOSIS — J449 Chronic obstructive pulmonary disease, unspecified: Secondary | ICD-10-CM | POA: Insufficient documentation

## 2012-01-31 DIAGNOSIS — E7800 Pure hypercholesterolemia, unspecified: Secondary | ICD-10-CM | POA: Diagnosis not present

## 2012-01-31 DIAGNOSIS — I2510 Atherosclerotic heart disease of native coronary artery without angina pectoris: Secondary | ICD-10-CM | POA: Insufficient documentation

## 2012-01-31 DIAGNOSIS — E119 Type 2 diabetes mellitus without complications: Secondary | ICD-10-CM | POA: Insufficient documentation

## 2012-01-31 DIAGNOSIS — Z79899 Other long term (current) drug therapy: Secondary | ICD-10-CM | POA: Insufficient documentation

## 2012-01-31 DIAGNOSIS — K219 Gastro-esophageal reflux disease without esophagitis: Secondary | ICD-10-CM | POA: Diagnosis not present

## 2012-01-31 DIAGNOSIS — C679 Malignant neoplasm of bladder, unspecified: Secondary | ICD-10-CM | POA: Diagnosis not present

## 2012-01-31 DIAGNOSIS — I10 Essential (primary) hypertension: Secondary | ICD-10-CM | POA: Insufficient documentation

## 2012-01-31 HISTORY — DX: Shortness of breath: R06.02

## 2012-01-31 HISTORY — DX: Other complications of anesthesia, initial encounter: T88.59XA

## 2012-01-31 HISTORY — DX: Other chronic pain: G89.29

## 2012-01-31 HISTORY — DX: Adverse effect of unspecified anesthetic, initial encounter: T41.45XA

## 2012-01-31 HISTORY — DX: Pain in unspecified knee: M25.569

## 2012-01-31 HISTORY — PX: TRANSURETHRAL RESECTION OF BLADDER TUMOR: SHX2575

## 2012-01-31 HISTORY — DX: Personal history of malignant neoplasm of bladder: Z85.51

## 2012-01-31 LAB — POCT I-STAT 4, (NA,K, GLUC, HGB,HCT)
Glucose, Bld: 143 mg/dL — ABNORMAL HIGH (ref 70–99)
HCT: 40 % (ref 39.0–52.0)
Hemoglobin: 13.6 g/dL (ref 13.0–17.0)
Potassium: 3.7 mEq/L (ref 3.5–5.1)
Sodium: 138 mEq/L (ref 135–145)

## 2012-01-31 LAB — GLUCOSE, CAPILLARY: Glucose-Capillary: 92 mg/dL (ref 70–99)

## 2012-01-31 SURGERY — TURBT (TRANSURETHRAL RESECTION OF BLADDER TUMOR)
Anesthesia: General | Site: Bladder | Wound class: Clean Contaminated

## 2012-01-31 MED ORDER — MIDAZOLAM HCL 5 MG/5ML IJ SOLN
INTRAMUSCULAR | Status: DC | PRN
  Administered 2012-01-31: 2 mg via INTRAVENOUS

## 2012-01-31 MED ORDER — SODIUM CHLORIDE 0.9 % IR SOLN
Status: DC | PRN
  Administered 2012-01-31: 6000 mL via INTRAVESICAL

## 2012-01-31 MED ORDER — LACTATED RINGERS IV SOLN
INTRAVENOUS | Status: DC
  Filled 2012-01-31: qty 1000

## 2012-01-31 MED ORDER — HYDROMORPHONE HCL PF 1 MG/ML IJ SOLN
0.2500 mg | INTRAMUSCULAR | Status: DC | PRN
  Filled 2012-01-31: qty 1

## 2012-01-31 MED ORDER — PHENAZOPYRIDINE HCL 200 MG PO TABS: 200.0000 mg | ORAL_TABLET | Freq: Three times a day (TID) | ORAL | Status: DC | PRN

## 2012-01-31 MED ORDER — GLYCOPYRROLATE 0.2 MG/ML IJ SOLN
INTRAMUSCULAR | Status: DC | PRN
  Administered 2012-01-31: 0.6 mg via INTRAVENOUS

## 2012-01-31 MED ORDER — MITOMYCIN CHEMO FOR BLADDER INSTILLATION 40 MG
40.0000 mg | Freq: Once | INTRAVENOUS | Status: AC
  Administered 2012-01-31: 40 mg via INTRAVESICAL
  Filled 2012-01-31: qty 40

## 2012-01-31 MED ORDER — PHENAZOPYRIDINE HCL 200 MG PO TABS: 200.0000 mg | ORAL_TABLET | Freq: Once | ORAL | Status: DC

## 2012-01-31 MED ORDER — LACTATED RINGERS IV SOLN
INTRAVENOUS | Status: DC
  Administered 2012-01-31: 14:00:00 via INTRAVENOUS
  Filled 2012-01-31: qty 1000

## 2012-01-31 MED ORDER — KETOROLAC TROMETHAMINE 30 MG/ML IJ SOLN
INTRAMUSCULAR | Status: DC | PRN
  Administered 2012-01-31: 30 mg via INTRAVENOUS

## 2012-01-31 MED ORDER — NEOSTIGMINE METHYLSULFATE 1 MG/ML IJ SOLN
INTRAMUSCULAR | Status: DC | PRN
  Administered 2012-01-31: 4 mg via INTRAVENOUS

## 2012-01-31 MED ORDER — LACTATED RINGERS IV SOLN
INTRAVENOUS | Status: DC
  Administered 2012-01-31: 09:00:00 via INTRAVENOUS
  Filled 2012-01-31: qty 1000

## 2012-01-31 MED ORDER — LACTATED RINGERS IV SOLN
INTRAVENOUS | Status: DC | PRN
  Administered 2012-01-31 (×2): via INTRAVENOUS

## 2012-01-31 MED ORDER — HYDROCODONE-ACETAMINOPHEN 7.5-325 MG PO TABS: 1.0000 | ORAL_TABLET | ORAL | Status: DC | PRN

## 2012-01-31 MED ORDER — SUCCINYLCHOLINE CHLORIDE 20 MG/ML IJ SOLN
INTRAMUSCULAR | Status: DC | PRN
  Administered 2012-01-31: 140 mg via INTRAVENOUS

## 2012-01-31 MED ORDER — LIDOCAINE HCL (CARDIAC) 20 MG/ML IV SOLN
INTRAVENOUS | Status: DC | PRN
  Administered 2012-01-31: 100 mg via INTRAVENOUS

## 2012-01-31 MED ORDER — PROMETHAZINE HCL 25 MG/ML IJ SOLN
6.2500 mg | INTRAMUSCULAR | Status: DC | PRN
  Filled 2012-01-31: qty 1

## 2012-01-31 MED ORDER — FENTANYL CITRATE 0.05 MG/ML IJ SOLN
25.0000 ug | INTRAMUSCULAR | Status: DC | PRN
  Filled 2012-01-31: qty 1

## 2012-01-31 MED ORDER — CIPROFLOXACIN IN D5W 200 MG/100ML IV SOLN
200.0000 mg | INTRAVENOUS | Status: AC
  Administered 2012-01-31: 200 mg via INTRAVENOUS
  Filled 2012-01-31: qty 100

## 2012-01-31 MED ORDER — FENTANYL CITRATE 0.05 MG/ML IJ SOLN
INTRAMUSCULAR | Status: DC | PRN
  Administered 2012-01-31: 100 ug via INTRAVENOUS

## 2012-01-31 MED ORDER — ONDANSETRON HCL 4 MG/2ML IJ SOLN
INTRAMUSCULAR | Status: DC | PRN
  Administered 2012-01-31: 4 mg via INTRAVENOUS

## 2012-01-31 MED ORDER — PROPOFOL 10 MG/ML IV BOLUS
INTRAVENOUS | Status: DC | PRN
  Administered 2012-01-31: 300 mg via INTRAVENOUS

## 2012-01-31 MED ORDER — ROCURONIUM BROMIDE 100 MG/10ML IV SOLN
INTRAVENOUS | Status: DC | PRN
  Administered 2012-01-31: 20 mg via INTRAVENOUS

## 2012-01-31 SURGICAL SUPPLY — 33 items
BAG DRAIN URO-CYSTO SKYTR STRL (DRAIN) ×2 IMPLANT
BAG DRN ANRFLXCHMBR STRAP LEK (BAG)
BAG DRN UROCATH (DRAIN) ×1
BAG URINE DRAINAGE (UROLOGICAL SUPPLIES) IMPLANT
BAG URINE LEG 19OZ MD ST LTX (BAG) IMPLANT
CANISTER SUCT LVC 12 LTR MEDI- (MISCELLANEOUS) ×2 IMPLANT
CATH FOLEY 2WAY SLVR  5CC 20FR (CATHETERS) ×1
CATH FOLEY 2WAY SLVR  5CC 22FR (CATHETERS)
CATH FOLEY 2WAY SLVR  5CC 24FR (CATHETERS)
CATH FOLEY 2WAY SLVR 5CC 20FR (CATHETERS) IMPLANT
CATH FOLEY 2WAY SLVR 5CC 22FR (CATHETERS) IMPLANT
CATH FOLEY 2WAY SLVR 5CC 24FR (CATHETERS) ×1 IMPLANT
CLOTH BEACON ORANGE TIMEOUT ST (SAFETY) ×2 IMPLANT
DRAPE CAMERA CLOSED 9X96 (DRAPES) ×2 IMPLANT
ELECT BUTTON HF 24-28F 2 30DE (ELECTRODE) IMPLANT
ELECT LOOP MED HF 24F 12D (CUTTING LOOP) ×1 IMPLANT
ELECT REM PT RETURN 9FT ADLT (ELECTROSURGICAL)
ELECT RESECT VAPORIZE 12D CBL (ELECTRODE) ×1 IMPLANT
ELECTRODE REM PT RTRN 9FT ADLT (ELECTROSURGICAL) ×1 IMPLANT
EVACUATOR MICROVAS BLADDER (UROLOGICAL SUPPLIES) ×1 IMPLANT
GLOVE BIO SURGEON STRL SZ 6.5 (GLOVE) ×1 IMPLANT
GLOVE BIO SURGEON STRL SZ8 (GLOVE) ×2 IMPLANT
GLOVE ECLIPSE 6.0 STRL STRAW (GLOVE) ×1 IMPLANT
GOWN PREVENTION PLUS LG XLONG (DISPOSABLE) ×2 IMPLANT
GOWN STRL REIN XL XLG (GOWN DISPOSABLE) ×2 IMPLANT
HOLDER FOLEY CATH W/STRAP (MISCELLANEOUS) IMPLANT
IV NS IRRIG 3000ML ARTHROMATIC (IV SOLUTION) ×2 IMPLANT
KIT ASPIRATION TUBING (SET/KITS/TRAYS/PACK) ×1 IMPLANT
NS IRRIG 500ML POUR BTL (IV SOLUTION) ×1 IMPLANT
PACK CYSTOSCOPY (CUSTOM PROCEDURE TRAY) ×2 IMPLANT
PLUG CATH AND CAP STER (CATHETERS) ×1 IMPLANT
SET ASPIRATION TUBING (TUBING) IMPLANT
WATER STERILE IRR 3000ML UROMA (IV SOLUTION) IMPLANT

## 2012-01-31 NOTE — Anesthesia Postprocedure Evaluation (Signed)
Anesthesia Post Note  Patient: Michael Horn  Procedure(s) Performed: Procedure(s) (LRB): TRANSURETHRAL RESECTION OF BLADDER TUMOR (TURBT) (N/A)  Anesthesia type: General  Patient location: PACU  Post pain: Pain level controlled  Post assessment: Post-op Vital signs reviewed  Last Vitals:  Filed Vitals:   01/31/12 1030  BP: 133/90  Pulse: 90  Temp: 36.3 C  Resp: 15    Post vital signs: Reviewed  Level of consciousness: sedated  Complications: No apparent anesthesia complications

## 2012-01-31 NOTE — Interval H&P Note (Signed)
History and Physical Interval Note:  01/31/2012 9:45 AM  Michael Horn  has presented today for surgery, with the diagnosis of Bladder Tumor  The various methods of treatment have been discussed with the patient and family. After consideration of risks, benefits and other options for treatment, the patient has consented to  Procedure(s) (LRB) with comments: TRANSURETHRAL RESECTION OF BLADDER TUMOR (TURBT) (N/A) as a surgical intervention .  The patient's history has been reviewed, patient examined, no change in status, stable for surgery.  I have reviewed the patient's chart and labs.  Questions were answered to the patient's satisfaction.     Garnett Farm

## 2012-01-31 NOTE — Op Note (Signed)
PATIENT:  Michael Horn  PRE-OPERATIVE DIAGNOSIS: Bladder tumor  POST-OPERATIVE DIAGNOSIS: Same  PROCEDURE:  Procedure(s): 1. TRANSURETHRAL RESECTION OF BLADDER TUMOR (TURBT) (1.0cm.) 2. Instillation of intravesical chemotherapy  SURGEON:  Surgeon(s): Garnett Farm  ANESTHESIA:   General  EBL:  Minimal  DRAINS: Urinary Catheter (20 Fr. Foley)   SPECIMEN:  Source of Specimen:  Bladder tumor  DISPOSITION OF SPECIMEN:  PATHOLOGY  Indication: 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 He was found on recent surveillance cystoscopy to have recurrent papillary tumors and is brought back to the operating room for resection of the tumors.  Description of operation: The patient was taken to the operating room and administered general anesthesia. He was then placed on the table and moved to the dorsal lithotomy position after which his genitalia was sterilely prepped and draped. An official timeout was then performed.  The 26 French resectoscope with Timberlake obturator was then introduced into the bladder and the obturator was removed. The resectoscope element with 12 lens was then inserted and the bladder was fully and systematically inspected. Ureteral orifices were noted to be in the normal anatomic positions. The tumors were located just to the left of the midline at the dome of the bladder. There also appeared to be a papillary abnormality about 2 mm in size at the 5:00 position at the bladder neck.  I first began by resecting the lesions at the bladder neck and then fulgurated this location. I then resected the tumors at the dome of the bladder and then fulgurated the base of the tumors and the surrounding mucosa. Reinspection of the bladder revealed all obvious tumor had been fully resected and there was no evidence of perforation. The Microvasive evacuator was then used to irrigate the bladder and remove all of the portions  of bladder tumor which were sent to pathology. I then removed the resectoscope.  A 20 French Foley catheter was then inserted in the bladder and irrigated. The irrigant returned clear with no clots. The patient was awakened and taken to the recovery room.  While in the recovery room 40 mg of mitomycin-C in 40 cc of water were instilled in the bladder through the catheter and the catheter was plugged. This will remain indwelling for approximately one hour. It will then be drained from the bladder and the catheter will be removed and the patient discharged home.  PLAN OF CARE: Discharge to home after PACU  PATIENT DISPOSITION:  PACU - hemodynamically stable.

## 2012-01-31 NOTE — Anesthesia Preprocedure Evaluation (Addendum)
Anesthesia Evaluation  Patient identified by MRN, date of birth, ID band Patient awake    Reviewed: Allergy & Precautions, H&P , NPO status , Patient's Chart, lab work & pertinent test results  History of Anesthesia Complications (+) Emergence Delirium  Airway Mallampati: II TM Distance: >3 FB Neck ROM: Full    Dental  (+) Edentulous Lower, Partial Upper and Dental Advisory Given   Pulmonary shortness of breath, sleep apnea , COPDCurrent Smoker,  breath sounds clear to auscultation        Cardiovascular hypertension, + CAD Rhythm:Regular Rate:Normal     Neuro/Psych PSYCHIATRIC DISORDERS Depression  Neuromuscular disease    GI/Hepatic Neg liver ROS, hiatal hernia, GERD-  Medicated and Poorly Controlled,  Endo/Other  diabetes, Type 2, Oral Hypoglycemic AgentsMorbid obesity  Renal/GU negative Renal ROS     Musculoskeletal negative musculoskeletal ROS (+)   Abdominal (+) + obese,   Peds  Hematology negative hematology ROS (+)   Anesthesia Other Findings   Reproductive/Obstetrics                          Anesthesia Physical Anesthesia Plan  ASA: III  Anesthesia Plan: General   Post-op Pain Management:    Induction: Intravenous  Airway Management Planned: Oral ETT  Additional Equipment:   Intra-op Plan:   Post-operative Plan: Extubation in OR  Informed Consent: I have reviewed the patients History and Physical, chart, labs and discussed the procedure including the risks, benefits and alternatives for the proposed anesthesia with the patient or authorized representative who has indicated his/her understanding and acceptance.   Dental advisory given  Plan Discussed with: CRNA  Anesthesia Plan Comments:        Anesthesia Quick Evaluation

## 2012-01-31 NOTE — Anesthesia Procedure Notes (Addendum)
Procedure Name: Intubation Date/Time: 01/31/2012 9:54 AM Performed by: Jessica Priest Pre-anesthesia Checklist: Patient identified, Emergency Drugs available, Suction available and Patient being monitored Patient Re-evaluated:Patient Re-evaluated prior to inductionOxygen Delivery Method: Circle System Utilized Preoxygenation: Pre-oxygenation with 100% oxygen Intubation Type: IV induction Ventilation: Mask ventilation without difficulty Laryngoscope Size: Mac and 4 Grade View: Grade II Tube type: Oral Tube size: 8.0 mm Number of attempts: 1 Airway Equipment and Method: stylet and oral airway Placement Confirmation: ETT inserted through vocal cords under direct vision,  positive ETCO2 and breath sounds checked- equal and bilateral Secured at: 24 cm Tube secured with: Tape Dental Injury: Teeth and Oropharynx as per pre-operative assessment

## 2012-01-31 NOTE — Transfer of Care (Signed)
Immediate Anesthesia Transfer of Care Note  Patient: Michael Horn  Procedure(s) Performed: Procedure(s) (LRB): TRANSURETHRAL RESECTION OF BLADDER TUMOR (TURBT) (N/A)  Patient Location: PACU  Anesthesia Type: General  Level of Consciousness: awake, sedated, patient cooperative and responds to stimulation  Airway & Oxygen Therapy: Patient Spontanous Breathing and Patient connected to face mask oxygen  Post-op Assessment: Report given to PACU RN, Post -op Vital signs reviewed and stable and Patient moving all extremities  Post vital signs: Reviewed and stable  Complications: No apparent anesthesia complications

## 2012-02-03 ENCOUNTER — Encounter (HOSPITAL_BASED_OUTPATIENT_CLINIC_OR_DEPARTMENT_OTHER): Payer: Self-pay | Admitting: Urology

## 2012-02-10 ENCOUNTER — Other Ambulatory Visit: Payer: Self-pay | Admitting: Family Medicine

## 2012-02-10 DIAGNOSIS — R109 Unspecified abdominal pain: Secondary | ICD-10-CM

## 2012-02-11 ENCOUNTER — Ambulatory Visit
Admission: RE | Admit: 2012-02-11 | Discharge: 2012-02-11 | Disposition: A | Discharge status: Home / Self Care | Payer: BC Managed Care – PPO | Source: Ambulatory Visit | Attending: Family Medicine | Admitting: Family Medicine

## 2012-02-11 DIAGNOSIS — R109 Unspecified abdominal pain: Secondary | ICD-10-CM

## 2012-03-21 ENCOUNTER — Emergency Department (HOSPITAL_COMMUNITY)
Admission: EM | Admit: 2012-03-21 | Discharge: 2012-03-21 | Disposition: A | Discharge status: Home / Self Care | Payer: BC Managed Care – PPO | Attending: Emergency Medicine | Admitting: Emergency Medicine

## 2012-03-21 ENCOUNTER — Encounter (HOSPITAL_COMMUNITY): Payer: Self-pay | Admitting: Emergency Medicine

## 2012-03-21 DIAGNOSIS — Z8551 Personal history of malignant neoplasm of bladder: Secondary | ICD-10-CM | POA: Insufficient documentation

## 2012-03-21 DIAGNOSIS — J449 Chronic obstructive pulmonary disease, unspecified: Secondary | ICD-10-CM | POA: Insufficient documentation

## 2012-03-21 DIAGNOSIS — Z8739 Personal history of other diseases of the musculoskeletal system and connective tissue: Secondary | ICD-10-CM | POA: Insufficient documentation

## 2012-03-21 DIAGNOSIS — Z8711 Personal history of peptic ulcer disease: Secondary | ICD-10-CM | POA: Insufficient documentation

## 2012-03-21 DIAGNOSIS — K219 Gastro-esophageal reflux disease without esophagitis: Secondary | ICD-10-CM | POA: Insufficient documentation

## 2012-03-21 DIAGNOSIS — E119 Type 2 diabetes mellitus without complications: Secondary | ICD-10-CM | POA: Insufficient documentation

## 2012-03-21 DIAGNOSIS — I10 Essential (primary) hypertension: Secondary | ICD-10-CM | POA: Insufficient documentation

## 2012-03-21 DIAGNOSIS — J4489 Other specified chronic obstructive pulmonary disease: Secondary | ICD-10-CM | POA: Insufficient documentation

## 2012-03-21 DIAGNOSIS — F17200 Nicotine dependence, unspecified, uncomplicated: Secondary | ICD-10-CM | POA: Insufficient documentation

## 2012-03-21 DIAGNOSIS — R339 Retention of urine, unspecified: Secondary | ICD-10-CM | POA: Insufficient documentation

## 2012-03-21 DIAGNOSIS — G8929 Other chronic pain: Secondary | ICD-10-CM | POA: Insufficient documentation

## 2012-03-21 DIAGNOSIS — I2510 Atherosclerotic heart disease of native coronary artery without angina pectoris: Secondary | ICD-10-CM | POA: Insufficient documentation

## 2012-03-21 DIAGNOSIS — I739 Peripheral vascular disease, unspecified: Secondary | ICD-10-CM | POA: Insufficient documentation

## 2012-03-21 DIAGNOSIS — N390 Urinary tract infection, site not specified: Secondary | ICD-10-CM | POA: Insufficient documentation

## 2012-03-21 DIAGNOSIS — G4733 Obstructive sleep apnea (adult) (pediatric): Secondary | ICD-10-CM | POA: Insufficient documentation

## 2012-03-21 LAB — URINALYSIS, MICROSCOPIC ONLY
Nitrite: NEGATIVE
Protein, ur: >300 mg/dL — AB
Specific Gravity, Urine: 1.021 (ref 1.005–1.030)
Urobilinogen, UA: 1 mg/dL (ref 0.0–1.0)

## 2012-03-21 LAB — CBC
HCT: 37.6 % — ABNORMAL LOW (ref 39.0–52.0)
Hemoglobin: 12.6 g/dL — ABNORMAL LOW (ref 13.0–17.0)
MCV: 74 fL — ABNORMAL LOW (ref 78.0–100.0)
RDW: 15.9 % — ABNORMAL HIGH (ref 11.5–15.5)
WBC: 10.3 10*3/uL (ref 4.0–10.5)

## 2012-03-21 LAB — HEMOGLOBIN AND HEMATOCRIT, BLOOD: HCT: 37.9 % — ABNORMAL LOW (ref 39.0–52.0)

## 2012-03-21 LAB — BASIC METABOLIC PANEL
BUN: 18 mg/dL (ref 6–23)
CO2: 24 mEq/L (ref 19–32)
Chloride: 101 mEq/L (ref 96–112)
Creatinine, Ser: 1.02 mg/dL (ref 0.50–1.35)
Potassium: 4.3 mEq/L (ref 3.5–5.1)

## 2012-03-21 MED ORDER — IBUPROFEN 800 MG PO TABS
800.0000 mg | ORAL_TABLET | Freq: Once | ORAL | Status: AC
  Administered 2012-03-21: 800 mg via ORAL
  Filled 2012-03-21: qty 1

## 2012-03-21 MED ORDER — DEXTROSE 5 % IV SOLN
1.0000 g | Freq: Once | INTRAVENOUS | Status: AC
  Administered 2012-03-21: 1 g via INTRAVENOUS
  Filled 2012-03-21: qty 10

## 2012-03-21 MED ORDER — CIPROFLOXACIN HCL 500 MG PO TABS: 500.0000 mg | ORAL_TABLET | Freq: Two times a day (BID) | ORAL | Status: DC

## 2012-03-21 NOTE — ED Provider Notes (Signed)
History     CSN: [id number]  Arrival date & time 03/21/12  0000   First MD Initiated Contact with Patient 03/21/12 0054      Chief Complaint  Patient presents with  . Post-op Problem  . Urinary Retention    (Consider location/radiation/quality/duration/timing/severity/associated sxs/prior treatment) HPI HX per PT, has bladder cancer and yesterday had Urologic procedure, since that time passing sediment in his urine and tonight developed urinary retention, worsening lower ABD discomfort with inability to pass urine. No F/C, noted his temp to be 99.1 here, no N/V or back pain, no hematuria. MOD in severity, no h/o same.  Past Medical History  Diagnosis Date  . Diabetes mellitus   . Hypertension   . OSA (obstructive sleep apnea) MILD-  NO CPAP RX  . COPD (chronic obstructive pulmonary disease)   . Peripheral neuropathy BOTTOM RIGHT FOOT  . GERD (gastroesophageal reflux disease)   . H/O hiatal hernia   . History of peptic ulcer AGE 36  . Bladder tumor   . Arthritis   . Depression   . Short of breath on exertion   . Coronary artery disease CARDIOLOGIST- DR Eldridge Dace  . Chronic knee pain RIGHT KNEE --  S/P KNEE REPLACEMENT JAN 2012--  PT STATES  NEEDS ANOTHER REPLACEMENT DUE TO JOINT RECALL  . History of bladder cancer TCC OF BLADDER  --  FOLLOWED BY DR Vernie Ammons  . Complication of anesthesia EMERGENCE DELIRIUM    Past Surgical History  Procedure Date  . Repair recurrent left rotator cuff tear 01-02-11  . Transurethral resection of bladder tumor 07-06-10  . Total knee arthroplasty 04-03-10    RIGHT  . Carpal tunnel release 04-11-2006    RIGHT  . Sigmoid colectomy 09-29-2001    DIVERTICULITIS  . Back surgery X2  . Left eye surg.  2008    REMOVAL OF BB  . Bilateral shoulder surg. 2003  . Bilateral knee arthroscopy   . Left carpal. tunnel release   . Cardiac catheterization 04-13-2007   ---- DR Eldridge Dace    NO SIGNIFICANT CAD/ NORMAL LVF  . Transurethral resection of  bladder tumor 05/20/2011    Procedure: TRANSURETHRAL RESECTION OF BLADDER TUMOR (TURBT);  Surgeon: Garnett Farm, MD;  Location: [facility];  Service: Urology;  Laterality: N/A;  GYRUS INSTILL MYTOMYCIN C NO BED  . Transurethral resection of bladder tumor 01/31/2012    Procedure: TRANSURETHRAL RESECTION OF BLADDER TUMOR (TURBT);  Surgeon: Garnett Farm, MD;  Location: [facility];  Service: Urology;  Laterality: N/A;    No family history on file.  History  Substance Use Topics  . Smoking status: Current Every Day Smoker -- 1.5 packs/day for 50 years    Types: Cigarettes  . Smokeless tobacco: Never Used  . Alcohol Use: No      Review of Systems  Constitutional: Negative for fever and chills.  HENT: Negative for neck pain and neck stiffness.   Eyes: Negative for pain.  Respiratory: Negative for shortness of breath.   Cardiovascular: Negative for chest pain.  Gastrointestinal: Negative for abdominal pain.  Genitourinary: Positive for urgency and difficulty urinating. Negative for dysuria.  Musculoskeletal: Negative for back pain.  Skin: Negative for rash.  Neurological: Negative for headaches.  All other systems reviewed and are negative.    Allergies  Contrast media  Home Medications   Current Outpatient Rx  Name  Route  Sig  Dispense  Refill  . ALPRAZOLAM 1 MG PO TABS  Oral   Take 1 mg by mouth 3 (three) times daily as needed.          Marland Kitchen CASANTHRANOL-DOCUSATE SODIUM 30-100 MG PO CAPS   Oral   Take by mouth as needed.         Marland Kitchen CITALOPRAM HYDROBROMIDE 20 MG PO TABS   Oral   Take 20 mg by mouth daily.         Marland Kitchen ESOMEPRAZOLE MAGNESIUM 40 MG PO CPDR   Oral   Take 40 mg by mouth daily before breakfast.         . FENOFIBRATE 160 MG PO TABS   Oral   Take 160 mg by mouth daily.         Marland Kitchen GLIMEPIRIDE 1 MG PO TABS   Oral   Take 1 mg by mouth daily before breakfast.         . HYDROCODONE-ACETAMINOPHEN 7.5-325 MG PO  TABS   Oral   Take 1-2 tablets by mouth every 4 (four) hours as needed for pain.   40 tablet   0   . LIRAGLUTIDE 18 MG/3ML [place] SOLN   Subcutaneous   Inject 1.25 mg into the skin at bedtime.         Marland Kitchen LISINOPRIL 5 MG PO TABS   Oral   Take 5 mg by mouth daily.         . MELOXICAM 7.5 MG PO TABS   Oral   Take 7.5 mg by mouth as needed.         Marland Kitchen METFORMIN HCL 850 MG PO TABS   Oral   Take 850 mg by mouth 2 (two) times daily with a meal.         . OXYCODONE HCL ER 10 MG PO TB12   Oral   Take 10 mg by mouth every 12 (twelve) hours.         Marland Kitchen PHENAZOPYRIDINE HCL 200 MG PO TABS   Oral   Take 1 tablet (200 mg total) by mouth 3 (three) times daily as needed for pain.   30 tablet   0   . PRAVASTATIN SODIUM 80 MG PO TABS   Oral   Take 80 mg by mouth every evening.         Marland Kitchen PREGABALIN 150 MG PO CAPS   Oral   Take 150 mg by mouth 2 (two) times daily.         Marland Kitchen TIOTROPIUM BROMIDE MONOHYDRATE 18 MCG IN CAPS   Inhalation   Place 18 mcg into inhaler and inhale every evening.            BP 120/71  Pulse 87  Temp 99.1 F (37.3 C) (Oral)  Resp 18  SpO2 96%  Physical Exam  Constitutional: He is oriented to person, place, and time. He appears well-developed and well-nourished.  HENT:  Head: Normocephalic and atraumatic.  Eyes: EOM are normal. Pupils are equal, round, and reactive to light.  Neck: Neck supple.  Cardiovascular: Regular rhythm and intact distal pulses.   Pulmonary/Chest: Effort normal. No respiratory distress.  Abdominal: Soft. Bowel sounds are normal.       mild suprapubic TTP with fullness  Genitourinary: Penis normal.  Musculoskeletal: Normal range of motion. He exhibits no edema.  Neurological: He is alert and oriented to person, place, and time.  Skin: Skin is warm and dry.    ED Course  Procedures (including critical care time)  Results for orders placed during the hospital encounter  of 03/21/12  URINALYSIS, MICROSCOPIC ONLY       Component Value Range   Color, Urine YELLOW  YELLOW   APPearance CLOUDY (*) CLEAR   Specific Gravity, Urine 1.021  1.005 - 1.030   pH 6.0  5.0 - 8.0   Glucose, UA NEGATIVE  NEGATIVE mg/dL   Hgb urine dipstick LARGE (*) NEGATIVE   Bilirubin Urine NEGATIVE  NEGATIVE   Ketones, ur NEGATIVE  NEGATIVE mg/dL   Protein, ur >161 (*) NEGATIVE mg/dL   Urobilinogen, UA 1.0  0.0 - 1.0 mg/dL   Nitrite NEGATIVE  NEGATIVE   Leukocytes, UA MODERATE (*) NEGATIVE   WBC, UA TOO NUMEROUS TO COUNT  <3 WBC/hpf   RBC / HPF TOO NUMEROUS TO COUNT  <3 RBC/hpf   Bacteria, UA RARE  RARE   Squamous Epithelial / LPF RARE  RARE  HEMOGLOBIN AND HEMATOCRIT, BLOOD      Component Value Range   Hemoglobin 12.7 (*) 13.0 - 17.0 g/dL   HCT 09.6 (*) 04.5 - 40.9 %  BASIC METABOLIC PANEL      Component Value Range   Sodium 135  135 - 145 mEq/L   Potassium 4.3  3.5 - 5.1 mEq/L   Chloride 101  96 - 112 mEq/L   CO2 24  19 - 32 mEq/L   Glucose, Bld 88  70 - 99 mg/dL   BUN 18  6 - 23 mg/dL   Creatinine, Ser 8.11  0.50 - 1.35 mg/dL   Calcium 9.3  8.4 - 91.4 mg/dL   GFR calc non Af Amer 76 (*) >90 mL/min   GFR calc Af Amer 88 (*) >90 mL/min   Foley placed with good UOP, flushed with good return. Leg bag placed. IV ABx for UTI. Plan Foley and ABx and close Urology follow up.   MDM  Urinary Retention/ UTI.   UA as above. Labs reviewed - crt WNL.   VS and nursing notes reviewed.         Sunnie Nielsen, MD 03/21/12 [phone]

## 2012-03-21 NOTE — ED Notes (Signed)
Pt alert, arrives from home, recent "BCG of bladder", procedure completed this afternoon, + urine output, resp even unlabored, skin pwd

## 2012-03-22 LAB — URINE CULTURE: Culture: NO GROWTH

## 2013-04-13 ENCOUNTER — Ambulatory Visit: Payer: Self-pay | Admitting: Podiatrist

## 2013-05-04 ENCOUNTER — Encounter: Payer: Self-pay | Admitting: Podiatrist

## 2013-05-04 ENCOUNTER — Ambulatory Visit (INDEPENDENT_AMBULATORY_CARE_PROVIDER_SITE_OTHER): Payer: BC Managed Care – PPO | Admitting: Podiatrist

## 2013-05-04 VITALS — BP 99/65 | HR 84 | Resp 18

## 2013-05-04 DIAGNOSIS — E1142 Type 2 diabetes mellitus with diabetic polyneuropathy: Secondary | ICD-10-CM

## 2013-05-04 DIAGNOSIS — E1140 Type 2 diabetes mellitus with diabetic neuropathy, unspecified: Secondary | ICD-10-CM

## 2013-05-04 DIAGNOSIS — E1149 Type 2 diabetes mellitus with other diabetic neurological complication: Secondary | ICD-10-CM

## 2013-05-04 DIAGNOSIS — M216X9 Other acquired deformities of unspecified foot: Secondary | ICD-10-CM

## 2013-05-04 NOTE — Patient Instructions (Signed)
Diabetes and Foot Care Diabetes may cause you to have problems because of poor blood supply (circulation) to your feet and legs. This may cause the skin on your feet to become thinner, break easier, and heal more slowly. Your skin may become dry, and the skin may peel and crack. You may also have nerve damage in your legs and feet causing decreased feeling in them. You may not notice minor injuries to your feet that could lead to infections or more serious problems. Taking care of your feet is one of the most important things you can do for yourself.  HOME CARE INSTRUCTIONS  Wear shoes at all times, even in the house. Do not go barefoot. Bare feet are easily injured.  Check your feet daily for blisters, cuts, and redness. If you cannot see the bottom of your feet, use a mirror or ask someone for help.  Wash your feet with warm water (do not use hot water) and mild soap. Then pat your feet and the areas between your toes until they are completely dry. Do not soak your feet as this can dry your skin.  Apply a moisturizing lotion or petroleum jelly (that does not contain alcohol and is unscented) to the skin on your feet and to dry, brittle toenails. Do not apply lotion between your toes.  Trim your toenails straight across. Do not dig under them or around the cuticle. File the edges of your nails with an emery board or nail file.  Do not cut corns or calluses or try to remove them with medicine.  Wear clean socks or stockings every day. Make sure they are not too tight. Do not wear knee-high stockings since they may decrease blood flow to your legs.  Wear shoes that fit properly and have enough cushioning. To break in new shoes, wear them for just a few hours a day. This prevents you from injuring your feet. Always look in your shoes before you put them on to be sure there are no objects inside.  Do not cross your legs. This may decrease the blood flow to your feet.  If you find a minor scrape,  cut, or break in the skin on your feet, keep it and the skin around it clean and dry. These areas may be cleansed with mild soap and water. Do not cleanse the area with peroxide, alcohol, or iodine.  When you remove an adhesive bandage, be sure not to damage the skin around it.  If you have a wound, look at it several times a day to make sure it is healing.  Do not use heating pads or hot water bottles. They may burn your skin. If you have lost feeling in your feet or legs, you may not know it is happening until it is too late.  Make sure your health care provider performs a complete foot exam at least annually or more often if you have foot problems. Report any cuts, sores, or bruises to your health care provider immediately. SEEK MEDICAL CARE IF:   You have an injury that is not healing.  You have cuts or breaks in the skin.  You have an ingrown nail.  You notice redness on your legs or feet.  You feel burning or tingling in your legs or feet.  You have pain or cramps in your legs and feet.  Your legs or feet are numb.  Your feet always feel cold. SEEK IMMEDIATE MEDICAL CARE IF:   There is increasing redness,   swelling, or pain in or around a wound.  There is a red line that goes up your leg.  Pus is coming from a wound.  You develop a fever or as directed by your health care provider.  You notice a bad smell coming from an ulcer or wound. Document Released: 02/23/2000 Document Revised: 10/28/2012 Document Reviewed: 08/04/2012 [employer] Patient Information 2014 [employer].  

## 2013-05-04 NOTE — Progress Notes (Signed)
   Subjective:    Patient ID: Michael Horn, male    DOB: 1951-01-07, 63 y.o.   MRN: [id number]   HPI I am here to get diabetic shoes from Dr. Delrae Rend and I have been a diabetic since 2009 and my feet burn and throb and tingling and stepped a pitch fork when I was 72 years old and they are scaly and the peel.  Patient relates numbness and tingling in both feet from the diabetes.      Review of Systems  All other systems reviewed and are negative.       Objective:   Physical Exam GENERAL APPEARANCE: Alert, conversant. Appropriately groomed. No acute distress.  VASCULAR: Pedal pulses palpable at 2/4 DP and PT bilateral.  Capillary refill time is immediate to all digits,  Proximal to distal cooling it warm to warm.  Digital hair growth is present bilateral  NEUROLOGIC: sensation is decreased epicritically and protectively to 5.07 monofilament at 6/10 sites bilateral-  Sensation decreased at distal digits and first metatarsal heads bilaterally.  Light touch is decreased bilateral, vibratory sensation intact bilateral, achilles tendon reflex is intact bilateral.  MUSCULOSKELETAL: high arched foot type with prominent first metatarsal head is present.. Decreased plantarflexory range of motion at first mpj noted bilateral.   DERMATOLOGIC: xerosis to bilateral plantar feet is present.  Mild hyperkeratotic lesion present submet 1 bilateral    Assessment & Plan:  Diabetes, neuropathy, high arched foot, prominent metatarsal,   Plan:  Recommended diabetic inserts-- he is interested in diabetic shoes but is not on medicare and therefore they are not covered by [employer] [facility].  Patient will call if interested in inserts.  Already has a pair of inserts from here he got 2 years ago but they are not specifically for a diabetic.  He will call if he wants inserts.

## 2013-05-05 ENCOUNTER — Telehealth: Payer: Self-pay

## 2013-05-05 MED ORDER — FENOFIBRATE 160 MG PO TABS: 160.0000 mg | ORAL_TABLET | Freq: Every day | ORAL | Status: DC

## 2013-05-05 NOTE — Telephone Encounter (Signed)
Refilled

## 2013-05-28 ENCOUNTER — Ambulatory Visit: Payer: BC Managed Care – PPO | Admitting: Interventional Cardiology

## 2013-07-08 ENCOUNTER — Ambulatory Visit (INDEPENDENT_AMBULATORY_CARE_PROVIDER_SITE_OTHER): Payer: BC Managed Care – PPO | Admitting: Interventional Cardiology

## 2013-07-08 ENCOUNTER — Encounter: Payer: Self-pay | Admitting: Interventional Cardiology

## 2013-07-08 VITALS — BP 110/70 | HR 83 | Ht 70.0 in | Wt 246.0 lb

## 2013-07-08 DIAGNOSIS — F17200 Nicotine dependence, unspecified, uncomplicated: Secondary | ICD-10-CM | POA: Insufficient documentation

## 2013-07-08 DIAGNOSIS — Z720 Tobacco use: Secondary | ICD-10-CM | POA: Insufficient documentation

## 2013-07-08 DIAGNOSIS — R079 Chest pain, unspecified: Secondary | ICD-10-CM | POA: Insufficient documentation

## 2013-07-08 DIAGNOSIS — E669 Obesity, unspecified: Secondary | ICD-10-CM | POA: Insufficient documentation

## 2013-07-08 DIAGNOSIS — I10 Essential (primary) hypertension: Secondary | ICD-10-CM

## 2013-07-08 DIAGNOSIS — E782 Mixed hyperlipidemia: Secondary | ICD-10-CM

## 2013-07-08 NOTE — Patient Instructions (Signed)
Your physician recommends that you continue on your current medications as directed. Please refer to the Current Medication list given to you today.  Your physician wants you to follow-up in: 1 year with Dr. Varanasi. You will receive a reminder letter in the mail two months in advance. If you don't receive a letter, please call our office to schedule the follow-up appointment.  

## 2013-07-08 NOTE — Progress Notes (Signed)
Patient ID: Michael Horn, male   DOB: 23-Jul-1950, 63 y.o.   MRN: [id number]    [facility] [place], [address] [place], [place]  [address] Phone: [phone] Fax:  [phone]  Date:  07/08/2013   ID:  Michael Horn, DOB 1950/08/05, MRN [id number]  PCP:  Mayra Neer, MD      History of Present Illness: Michael Horn is a 63 y.o. male who has several risk factors for CAD. He has gained weight since the last visit , but lost it.  He has not been able to exercise regularly. He had a knee replacement but there is still right knee pain. He had quit cigarettes for 3 months but resumed smoking. Hyperlipidemia:  interested in stopping smoking. No luck with electronic cigarettes. He had an Rx for Chantix, but it was very expensive.  Denies : Shortness of breath.  Palpitations.  Dizziness.  Leg edema.  Orthopnea.  doing therapy for his knee.  No further chest pain that persists.  He gets some twinges in his chest here and there, not related to exertion.   Wt Readings from Last 3 Encounters:  07/08/13 246 lb (111.585 kg)  01/31/12 248 lb (112.492 kg)  01/31/12 248 lb (112.492 kg)     Past Medical History  Diagnosis Date  . Diabetes mellitus   . Hypertension   . OSA (obstructive sleep apnea) MILD-  NO CPAP RX  . COPD (chronic obstructive pulmonary disease)   . Peripheral neuropathy BOTTOM RIGHT FOOT  . GERD (gastroesophageal reflux disease)   . H/O hiatal hernia   . History of peptic ulcer AGE 61  . Bladder tumor   . Arthritis   . Depression   . Short of breath on exertion   . Coronary artery disease CARDIOLOGIST- DR Irish Lack  . Chronic knee pain RIGHT KNEE --  S/P KNEE REPLACEMENT JAN 2012--  PT STATES  NEEDS ANOTHER REPLACEMENT DUE TO JOINT RECALL  . History of bladder cancer TCC OF BLADDER  --  FOLLOWED BY DR Karsten Ro  . Complication of anesthesia EMERGENCE DELIRIUM    Current Outpatient Prescriptions  Medication Sig Dispense Refill  . ALPRAZolam (XANAX) 1 MG  tablet Take 1 mg by mouth 3 (three) times daily as needed.       Marland Kitchen buPROPion (WELLBUTRIN XL) 150 MG 24 hr tablet       . cyclobenzaprine (FLEXERIL) 10 MG tablet Take 10 mg by mouth 3 (three) times daily as needed for muscle spasms.      Marland Kitchen esomeprazole (NEXIUM) 40 MG capsule Take 40 mg by mouth daily before breakfast.      . fenofibrate 160 MG tablet Take 1 tablet (160 mg total) by mouth daily.  30 tablet  6  . fluticasone (FLOVENT HFA) 44 MCG/ACT inhaler Inhale into the lungs 2 (two) times daily.      Marland Kitchen glimepiride (AMARYL) 1 MG tablet Take 1 mg by mouth 3 (three) times daily.       . insulin detemir (LEVEMIR) 100 UNIT/ML injection Inject into the skin at bedtime.      Marland Kitchen lisinopril (PRINIVIL,ZESTRIL) 5 MG tablet Take 5 mg by mouth daily.      . metFORMIN (GLUCOPHAGE) 850 MG tablet Take 850 mg by mouth 2 (two) times daily with a meal.      . morphine (MSIR) 15 MG tablet       . pravastatin (PRAVACHOL) 80 MG tablet Take 80 mg by mouth every evening.      Marland Kitchen  pregabalin (LYRICA) 150 MG capsule Take 150 mg by mouth 2 (two) times daily.      Marland Kitchen tiotropium (SPIRIVA) 18 MCG inhalation capsule Place 18 mcg into inhaler and inhale every evening.       Sarajane Marek Sodium 30-100 MG CAPS Take by mouth as needed.       No current facility-administered medications for this visit.    Allergies:    Allergies  Allergen Reactions  . Contrast Media [Iodinated Diagnostic Agents] Rash    Social History:  The patient  reports that he has been smoking Cigarettes.  He has a 75 pack-year smoking history. He has never used smokeless tobacco. He reports that he does not drink alcohol or use illicit drugs.   Family History:  The patient's family history is not on file.   ROS:  Please see the history of present illness.  No nausea, vomiting.  No fevers, chills.  No focal weakness.  No dysuria. Difficulty stopping smoking.  All other systems reviewed and negative.   PHYSICAL EXAM: VS:  BP 110/70  Pulse  83  Ht 5\' 10"  (1.778 m)  Wt 246 lb (111.585 kg)  BMI 35.30 kg/m2 Well nourished, well developed, in no acute distress HEENT: normal Neck: no JVD, no carotid bruits Cardiac:  normal S1, S2; RRR;  Lungs:  clear to auscultation bilaterally, no wheezing, rhonchi or rales Abd: soft, nontender, no , well healed midline scar, small bulge in te right side of the scar- unchanged per his report Ext: no edema Skin: warm and dry Neuro:   no focal abnormalities noted  EKG:  NSR. Inferior ST segment changes- chronic     ASSESSMENT AND PLAN:  Mixed hyperlipidemia  Continue Pravastatin Sodium Tablet, 40 MG, 2 tablet, once a day Continue Fenofibrate Tablet, 160 MG, 1 tablet, once a day Notes: triglycerides and LDL increased. continue pravastatin and Fenofibrate. lipids reviewed from 9/14 LDL 53, HDL 31, TG 191. TG better with better glucose control.    2. Tobacco dependence  Notes: I recommended strongly that he stop smoking.    3. Obesity, unspecified  Notes: He will continue to try to lose weight and hopefully will also stop smoking. He should try to walk more or find an exercise that works well wth the bad knee.    4. Chest pain  Notes: Atypical features. He will let us know if sx are related to exertion. Prior clean cath a few years ago in 2009.    Preventive Medicine  Adult topics discussed:  Smoking cessation:  Patient advised to quit smoking Diet: healthy diet.      Signed, Mina Marble, MD, [facility] 07/08/2013 1:17 PM

## 2013-10-28 ENCOUNTER — Other Ambulatory Visit: Payer: Self-pay | Admitting: Urology

## 2013-11-17 ENCOUNTER — Encounter (HOSPITAL_BASED_OUTPATIENT_CLINIC_OR_DEPARTMENT_OTHER): Payer: Self-pay | Admitting: *Deleted

## 2013-11-17 NOTE — Progress Notes (Signed)
NPO AFTER MN. ARRIVE AT 0900. NEEDS ISTAT. CURRENT EKG IN CHART AND EPIC. WILL TAKE NEXIUM, LYRICA, MORPHINE, AND DO FLOVENT INHALER AM DOS W/ SIPS OF WATER.

## 2013-11-22 ENCOUNTER — Ambulatory Visit (HOSPITAL_BASED_OUTPATIENT_CLINIC_OR_DEPARTMENT_OTHER): Payer: BC Managed Care – PPO | Admitting: Anesthesiology

## 2013-11-22 ENCOUNTER — Ambulatory Visit (HOSPITAL_BASED_OUTPATIENT_CLINIC_OR_DEPARTMENT_OTHER)
Admission: RE | Admit: 2013-11-22 | Discharge: 2013-11-22 | Disposition: A | Discharge status: Home / Self Care | Payer: BC Managed Care – PPO | Source: Ambulatory Visit | Attending: Urology | Admitting: Urology

## 2013-11-22 ENCOUNTER — Encounter (HOSPITAL_BASED_OUTPATIENT_CLINIC_OR_DEPARTMENT_OTHER): Payer: Self-pay | Admitting: *Deleted

## 2013-11-22 ENCOUNTER — Encounter (HOSPITAL_BASED_OUTPATIENT_CLINIC_OR_DEPARTMENT_OTHER): Payer: BC Managed Care – PPO | Admitting: Anesthesiology

## 2013-11-22 ENCOUNTER — Encounter (HOSPITAL_BASED_OUTPATIENT_CLINIC_OR_DEPARTMENT_OTHER)
Admission: RE | Disposition: A | Discharge status: Home / Self Care | Payer: Self-pay | Source: Ambulatory Visit | Attending: Urology

## 2013-11-22 DIAGNOSIS — F17200 Nicotine dependence, unspecified, uncomplicated: Secondary | ICD-10-CM | POA: Diagnosis not present

## 2013-11-22 DIAGNOSIS — E669 Obesity, unspecified: Secondary | ICD-10-CM | POA: Diagnosis not present

## 2013-11-22 DIAGNOSIS — Z79899 Other long term (current) drug therapy: Secondary | ICD-10-CM | POA: Diagnosis not present

## 2013-11-22 DIAGNOSIS — I2510 Atherosclerotic heart disease of native coronary artery without angina pectoris: Secondary | ICD-10-CM | POA: Diagnosis not present

## 2013-11-22 DIAGNOSIS — C679 Malignant neoplasm of bladder, unspecified: Secondary | ICD-10-CM | POA: Insufficient documentation

## 2013-11-22 DIAGNOSIS — E785 Hyperlipidemia, unspecified: Secondary | ICD-10-CM | POA: Diagnosis not present

## 2013-11-22 DIAGNOSIS — I10 Essential (primary) hypertension: Secondary | ICD-10-CM | POA: Insufficient documentation

## 2013-11-22 DIAGNOSIS — E119 Type 2 diabetes mellitus without complications: Secondary | ICD-10-CM | POA: Insufficient documentation

## 2013-11-22 DIAGNOSIS — F3289 Other specified depressive episodes: Secondary | ICD-10-CM | POA: Diagnosis not present

## 2013-11-22 DIAGNOSIS — K219 Gastro-esophageal reflux disease without esophagitis: Secondary | ICD-10-CM | POA: Insufficient documentation

## 2013-11-22 DIAGNOSIS — F329 Major depressive disorder, single episode, unspecified: Secondary | ICD-10-CM | POA: Diagnosis not present

## 2013-11-22 DIAGNOSIS — G4733 Obstructive sleep apnea (adult) (pediatric): Secondary | ICD-10-CM | POA: Insufficient documentation

## 2013-11-22 HISTORY — PX: TRANSURETHRAL RESECTION OF BLADDER TUMOR WITH GYRUS (TURBT-GYRUS): SHX6458

## 2013-11-22 HISTORY — DX: Obstructive sleep apnea (adult) (pediatric): G47.33

## 2013-11-22 LAB — GLUCOSE, CAPILLARY: Glucose-Capillary: 112 mg/dL — ABNORMAL HIGH (ref 70–99)

## 2013-11-22 LAB — POCT I-STAT 4, (NA,K, GLUC, HGB,HCT)
Glucose, Bld: 138 mg/dL — ABNORMAL HIGH (ref 70–99)
HCT: 39 % (ref 39.0–52.0)
Hemoglobin: 13.3 g/dL (ref 13.0–17.0)
Potassium: 4 mEq/L (ref 3.7–5.3)
Sodium: 139 mEq/L (ref 137–147)

## 2013-11-22 SURGERY — TRANSURETHRAL RESECTION OF BLADDER TUMOR WITH GYRUS (TURBT-GYRUS)
Anesthesia: General | Site: Bladder

## 2013-11-22 MED ORDER — ACETAMINOPHEN 10 MG/ML IV SOLN
INTRAVENOUS | Status: DC | PRN
  Administered 2013-11-22: 1000 mg via INTRAVENOUS

## 2013-11-22 MED ORDER — PHENAZOPYRIDINE HCL 200 MG PO TABS
200.0000 mg | ORAL_TABLET | Freq: Once | ORAL | Status: AC
  Administered 2013-11-22: 200 mg via ORAL
  Filled 2013-11-22: qty 1

## 2013-11-22 MED ORDER — LIDOCAINE HCL (CARDIAC) 20 MG/ML IV SOLN
INTRAVENOUS | Status: DC | PRN
  Administered 2013-11-22: 100 mg via INTRAVENOUS

## 2013-11-22 MED ORDER — MIDAZOLAM HCL 5 MG/5ML IJ SOLN
INTRAMUSCULAR | Status: DC | PRN
  Administered 2013-11-22: 2 mg via INTRAVENOUS

## 2013-11-22 MED ORDER — TAMSULOSIN HCL 0.4 MG PO CAPS
0.4000 mg | ORAL_CAPSULE | Freq: Once | ORAL | Status: AC
  Administered 2013-11-22: 0.4 mg via ORAL
  Filled 2013-11-22: qty 1

## 2013-11-22 MED ORDER — CIPROFLOXACIN IN D5W 200 MG/100ML IV SOLN
200.0000 mg | INTRAVENOUS | Status: AC
  Administered 2013-11-22: 200 mg via INTRAVENOUS
  Filled 2013-11-22: qty 100

## 2013-11-22 MED ORDER — PROPOFOL INFUSION 10 MG/ML OPTIME
INTRAVENOUS | Status: DC | PRN
  Administered 2013-11-22: 200 mL via INTRAVENOUS

## 2013-11-22 MED ORDER — PHENAZOPYRIDINE HCL 200 MG PO TABS: 200.0000 mg | ORAL_TABLET | Freq: Three times a day (TID) | ORAL | Status: DC | PRN

## 2013-11-22 MED ORDER — PHENAZOPYRIDINE HCL 100 MG PO TABS
ORAL_TABLET | ORAL | Status: AC
  Filled 2013-11-22: qty 2

## 2013-11-22 MED ORDER — MIDAZOLAM HCL 2 MG/2ML IJ SOLN
INTRAMUSCULAR | Status: AC
  Filled 2013-11-22: qty 2

## 2013-11-22 MED ORDER — FENTANYL CITRATE 0.05 MG/ML IJ SOLN
25.0000 ug | INTRAMUSCULAR | Status: DC | PRN
  Filled 2013-11-22: qty 1

## 2013-11-22 MED ORDER — CIPROFLOXACIN IN D5W 200 MG/100ML IV SOLN
INTRAVENOUS | Status: AC
  Filled 2013-11-22: qty 100

## 2013-11-22 MED ORDER — HYDROCODONE-ACETAMINOPHEN 10-325 MG PO TABS: 1.0000 | ORAL_TABLET | ORAL | Status: DC | PRN

## 2013-11-22 MED ORDER — HYDROCODONE-ACETAMINOPHEN 5-325 MG PO TABS
1.0000 | ORAL_TABLET | ORAL | Status: DC | PRN
  Administered 2013-11-22: 1 via ORAL
  Filled 2013-11-22: qty 2

## 2013-11-22 MED ORDER — FENTANYL CITRATE 0.05 MG/ML IJ SOLN
INTRAMUSCULAR | Status: DC | PRN
  Administered 2013-11-22 (×2): 50 ug via INTRAVENOUS

## 2013-11-22 MED ORDER — LIDOCAINE HCL 2 % EX GEL
CUTANEOUS | Status: DC | PRN
  Administered 2013-11-22: 1 via URETHRAL

## 2013-11-22 MED ORDER — FENTANYL CITRATE 0.05 MG/ML IJ SOLN
INTRAMUSCULAR | Status: AC
  Filled 2013-11-22: qty 4

## 2013-11-22 MED ORDER — ONDANSETRON HCL 4 MG/2ML IJ SOLN
INTRAMUSCULAR | Status: DC | PRN
Start: 2013-11-22 — End: 2013-11-22
  Administered 2013-11-22: 4 mg via INTRAVENOUS

## 2013-11-22 MED ORDER — LACTATED RINGERS IV SOLN
INTRAVENOUS | Status: DC
  Filled 2013-11-22: qty 1000

## 2013-11-22 MED ORDER — STERILE WATER FOR IRRIGATION IR SOLN
Status: DC | PRN
  Administered 2013-11-22: 3500 mL

## 2013-11-22 MED ORDER — DEXAMETHASONE SODIUM PHOSPHATE 10 MG/ML IJ SOLN
INTRAMUSCULAR | Status: DC | PRN
  Administered 2013-11-22: 10 mg via INTRAVENOUS

## 2013-11-22 MED ORDER — LACTATED RINGERS IV SOLN
INTRAVENOUS | Status: DC
  Administered 2013-11-22: 10:00:00 via INTRAVENOUS
  Filled 2013-11-22: qty 1000

## 2013-11-22 MED ORDER — TAMSULOSIN HCL 0.4 MG PO CAPS
ORAL_CAPSULE | ORAL | Status: AC
  Filled 2013-11-22: qty 1

## 2013-11-22 MED ORDER — HYDROCODONE-ACETAMINOPHEN 5-325 MG PO TABS
ORAL_TABLET | ORAL | Status: AC
  Filled 2013-11-22: qty 1

## 2013-11-22 SURGICAL SUPPLY — 36 items
BAG DRAIN URO-CYSTO SKYTR STRL (DRAIN) ×2 IMPLANT
BAG DRN ANRFLXCHMBR STRAP LEK (BAG)
BAG DRN UROCATH (DRAIN) ×1
BAG URINE DRAINAGE (UROLOGICAL SUPPLIES) IMPLANT
BAG URINE LEG 19OZ MD ST LTX (BAG) IMPLANT
CANISTER SUCT LVC 12 LTR MEDI- (MISCELLANEOUS) ×1 IMPLANT
CATH FOLEY 2WAY SLVR  5CC 20FR (CATHETERS)
CATH FOLEY 2WAY SLVR  5CC 22FR (CATHETERS)
CATH FOLEY 2WAY SLVR  5CC 24FR (CATHETERS) ×1
CATH FOLEY 2WAY SLVR 5CC 20FR (CATHETERS) IMPLANT
CATH FOLEY 2WAY SLVR 5CC 22FR (CATHETERS) IMPLANT
CATH FOLEY 2WAY SLVR 5CC 24FR (CATHETERS) ×1 IMPLANT
CATH FOLEY 3WAY 20FR (CATHETERS) IMPLANT
CLOTH BEACON ORANGE TIMEOUT ST (SAFETY) ×2 IMPLANT
DRAPE CAMERA CLOSED 9X96 (DRAPES) ×2 IMPLANT
ELECT BUTTON HF 24-28F 2 30DE (ELECTRODE) IMPLANT
ELECT LOOP MED HF 24F 12D (CUTTING LOOP) IMPLANT
ELECT REM PT RETURN 9FT ADLT (ELECTROSURGICAL) ×2
ELECT RESECT VAPORIZE 12D CBL (ELECTRODE) ×2 IMPLANT
ELECTRODE REM PT RTRN 9FT ADLT (ELECTROSURGICAL) ×1 IMPLANT
EVACUATOR MICROVAS BLADDER (UROLOGICAL SUPPLIES) IMPLANT
FORCEPS BIOP 2.4F 115CM BACKLD (INSTRUMENTS) ×1 IMPLANT
GLOVE BIO SURGEON STRL SZ 6 (GLOVE) ×1 IMPLANT
GLOVE BIO SURGEON STRL SZ8 (GLOVE) ×2 IMPLANT
GLOVE INDICATOR 6.5 STRL GRN (GLOVE) ×2 IMPLANT
GOWN PREVENTION PLUS LG XLONG (DISPOSABLE) ×2 IMPLANT
GOWN STRL REIN XL XLG (GOWN DISPOSABLE) ×2 IMPLANT
GOWN STRL REUS W/ TWL LRG LVL3 (GOWN DISPOSABLE) IMPLANT
GOWN STRL REUS W/TWL LRG LVL3 (GOWN DISPOSABLE) ×2
GOWN STRL REUS W/TWL XL LVL3 (GOWN DISPOSABLE) ×1 IMPLANT
HOLDER FOLEY CATH W/STRAP (MISCELLANEOUS) ×1 IMPLANT
IV NS IRRIG 3000ML ARTHROMATIC (IV SOLUTION) IMPLANT
PACK CYSTOSCOPY (CUSTOM PROCEDURE TRAY) ×2 IMPLANT
PLUG CATH AND CAP STER (CATHETERS) IMPLANT
SET ASPIRATION TUBING (TUBING) IMPLANT
WATER STERILE IRR 3000ML UROMA (IV SOLUTION) ×1 IMPLANT

## 2013-11-22 NOTE — H&P (Signed)
Michael Horn is a 63 year old male with a history of transitional cell carcinoma of the bladder.   History of Present Illness    Transitional cell carcinoma of the bladder: He experienced an episode of gross hematuria while on Coumadin. His workup revealed no abnormality of the upper tract by CT scan however he was found to have a papillary tumor involving the right hemitrigone and ureteral orifice. He has smoked 1-1/2 packs of cigarettes a day for 48 years.  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   Recurrence X1: He had a single small recurrence in 11/12 that was treated in the office with the [place]. Due to the small size and low grade appearance of the lesion an induction course of BCG was not initiated.  Recurrence X2: TURBT on 05/20/11. His recurrence was at the bladder neck and also involved the left ureteral orifice but was found to be Ta,G1.  He underwent a 6 week induction course of intravesical BCG which he completed in 5/13.  Recurrence X3: TURBT 01/31/12 bladder neck at 6:00 and left wall (Ta,G1), postop mitomycin-C.  Second 6 week induction course of BCG (1/2 strength + Interon A) completed 2/14.    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 His PSA has remained well within the normal range.      Interval history: No new urologic complaints are noted. Specifically no hematuria or new irritative voiding symptoms.   Past Medical History Problems  1. History of Arthritis (V13.4) 2. History of depression (V11.8) 3. History of diabetes mellitus (V12.29) 4. History of diverticulitis of colon (V12.79) 5. History of esophageal reflux (V12.79) 6. History of hypercholesterolemia (V12.29) 7. Malignant neoplasm of urinary bladder  (188.9) 8. History of Urinary retention (788.20)  Surgical History Problems  1. History of Bladder Injection Of Cancer Treatment 2. History of Cystoscopy With Fulguration Medium Lesion (2-5cm) 3. History of Cystoscopy With Fulguration Small Lesion (5-67mm) 4. History of Cystoscopy With Fulguration Small Lesion (5-66mm) 5. History of Knee Replacement 6. History of Knee Replacement 7. History of Partial Colectomy 8. History of Rotator Cuff Repair 9. History of Shoulder Surgery  Current Meds 1. Accu-Chek Aviva In Vitro Strip;  Therapy: (Recorded:04Apr2012) to Recorded 2. ALPRAZolam 1 MG Oral Tablet;  Therapy: (Recorded:04Apr2012) to Recorded 3. Cialis 5 MG Oral Tablet;  Therapy: (Recorded:21Mar2013) to Recorded 4. Citalopram Hydrobromide 40 MG Oral Tablet;  Therapy: (Recorded:04Apr2012) to Recorded 5. Fenofibrate 160 MG Oral Tablet;  Therapy: (Recorded:04Apr2012) to Recorded 6. Fish Oil CAPS;  Therapy: (Recorded:04Apr2012) to Recorded 7. Flovent HFA 44 MCG/ACT Inhalation Aerosol;  Therapy: (Recorded:04Apr2012) to Recorded 8. Ketorolac Tromethamine 10 MG Oral Tablet;  Therapy: 25Oct2012 to Recorded 9. Levemir FlexPen 100 UNIT/ML SOLN;  Therapy: [id number] to Recorded 10. Lisinopril 5 MG Oral Tablet;   Therapy: (Recorded:04Apr2012) to Recorded 11. Lyrica 150 MG Oral Capsule;   Therapy: (Recorded:04Apr2012) to Recorded 12. MetFORMIN HCl - 1000 MG Oral Tablet;   Therapy: (Recorded:17Feb2015) to Recorded 13. Methocarbamol 500 MG Oral Tablet;   Therapy: (Recorded:21Mar2013) to Recorded 14. NexIUM 40 MG Oral Capsule Delayed Release;   Therapy: (Recorded:04Apr2012) to Recorded 15. Percocet 10-325 MG Oral Tablet;   Therapy: (Recorded:21Mar2013) to Recorded 16. Pravastatin Sodium 40 MG Oral Tablet;   Therapy: (Recorded:04Apr2012) to Recorded 17. Spiriva HandiHaler 18 MCG Inhalation  Capsule;   Therapy: [id number] to Recorded 18. TraMADol HCl - 50 MG Oral Tablet;   Therapy:  [id number] to Recorded  Allergies Medication  1. No Known Drug Allergies  Family History Problems  1. Family history of Death In The Family Mother   7; stroke 2. Family history of Family Health Status - Father's Age   98 3. Family history of Family Health Status Children ___ Living Daughters   2 4. Family history of Family Health Status Children ___ Living Sons   2 5. Family history of Stroke Syndrome (V17.1) : Mother  Social History Problems  1. Denied: History of Alcohol Use 2. Caffeine Use   3-4 3. Former smoker (V15.82)   smoked 1.5 ppd for 46 years; quit 2 months ago 4. Marital History - Currently Married 5. Retired From Work  Review of [employer], constitutional, skin, eye, otolaryngeal, hematologic/lymphatic, cardiovascular, pulmonary, endocrine, musculoskeletal, gastrointestinal, neurological and psychiatric system(s) were reviewed and pertinent findings if present are noted.  Genitourinary: nocturia, hematuria and erectile dysfunction.  Gastrointestinal: heartburn.  Constitutional: feeling tired (fatigue).  Eyes: blurred vision.  Musculoskeletal: back pain and joint pain.    Vitals All Vitals  Blood Pressure  Blood Pressure: 118 / 68 Temperature  Temperature: 98.2 F Pulse  Heart Rate: 80 Height Weight BMI BSA  Height: 5 ft 9 in Weight: 250 lb  BMI: 36.92 kg/m2 BSA: 2.27 m2  Physical Exam Constitutional: Well nourished and well developed. No acute distress. The patient appears well hydrated.  ENT:. The ears and nose are normal in appearance.  Neck: The appearance of the neck is normal.  Pulmonary: No respiratory distress.  Cardiovascular: Heart rate and rhythm are normal.  Abdomen: The abdomen is obese. The abdomen is soft and nontender. No suprapubic tenderness. No CVA tenderness. Bowel sounds are normal. No hernias are palpable. No hepatosplenomegaly noted.  Rectal: Rectal exam demonstrates normal sphincter tone, the anus is normal  on inspection. and no tenderness. Estimated prostate size is 2+. Normal rectal tone, no rectal masses, prostate is smooth, symmetric and non-tender. The prostate has no nodularity, is not indurated, is not tender and is not fluctuant. The perineum is normal on inspection, no perineal tenderness.  Genitourinary: Examination of the penis demonstrates a normal meatus. The penis is circumcised. The scrotum is normal in appearance. The right testis is palpably normal, not enlarged and non-tender. The left testis is normal, not enlarged and non-tender.  Lymphatics: The femoral and inguinal nodes are not enlarged or tender.  Skin: Normal skin turgor and normal skin color and pigmentation.  Neuro/Psych:. Mood and affect are appropriate.    Procedure  Procedure: Cystoscopy done on 10/27/13  Indication: History of Urothelial Carcinoma.  Informed Consent: Risks, benefits, and potential adverse events were discussed and informed consent was obtained from the patient.  Prep: The patient was prepped with betadine.  Anesthesia:. Local anesthesia was administered intraurethrally with 2% lidocaine jelly.  Procedure Note:  Urethral meatus:. No abnormalities.  Anterior urethra: No abnormalities.  Prostatic urethra: No abnormalities.  Bladder: Visulization was clear. The ureteral orifices were in the normal anatomic position bilaterally and had clear efflux of urine. A solitary tumor was visualized in the bladder. A sessile tumor was seen in the bladder. This tumor was located on the right side, on the anterior aspect of the bladder. The patient tolerated the procedure well.  Complications: None.    Assessment   I found a recurrence of what appears to be a flat papillary growth on  the right anterior wall of the bladder today. I was going to treat him with intravesical BCG for maintenance course but will now need to take him to the operating room for resection of the tumor. It is fairly small but too large to be  fulgurated here in the office. My plan will be to remove this tumor and I will consider mitomycin-C. I will then have him return in 3 months for surveillance cystoscopy and continued maintenance BCG from here out.      Plan  Transurethral resection of his recurrent bladder tumor with postoperative mitomycin-C.

## 2013-11-22 NOTE — Anesthesia Postprocedure Evaluation (Signed)
  Anesthesia Post-op Note  Patient: Michael Horn  Procedure(s) Performed: Procedure(s) (LRB): Bladder biopsy with fulgeration (N/A)  Patient Location: PACU  Anesthesia Type: General  Level of Consciousness: awake and alert   Airway and Oxygen Therapy: Patient Spontanous Breathing  Post-op Pain: mild  Post-op Assessment: Post-op Vital signs reviewed, Patient's Cardiovascular Status Stable, Respiratory Function Stable, Patent Airway and No signs of Nausea or vomiting  Last Vitals:  Filed Vitals:   11/22/13 1145  BP: 129/76  Pulse: 71  Temp: 36.3 C  Resp: 8    Post-op Vital Signs: stable   Complications: No apparent anesthesia complications

## 2013-11-22 NOTE — Transfer of Care (Signed)
Immediate Anesthesia Transfer of Care Note  Patient: Michael Horn  Procedure(s) Performed: Procedure(s): Bladder biopsy with fulgeration (N/A)  Patient Location: PACU  Anesthesia Type:General  Level of Consciousness: awake, alert , oriented and patient cooperative  Airway & Oxygen Therapy: Patient Spontanous Breathing and Patient connected to nasal cannula oxygen  Post-op Assessment: Report given to PACU RN and Post -op Vital signs reviewed and stable  Post vital signs: Reviewed and stable  Complications: No apparent anesthesia complications

## 2013-11-22 NOTE — Op Note (Signed)
PATIENT:  Michael Horn  PRE-OPERATIVE DIAGNOSIS: Recurrent transitional cell carcinoma of the bladder  POST-OPERATIVE DIAGNOSIS: Same  PROCEDURE: Bladder biopsy and fulguration of lesion  SURGEON:  Claybon Jabs  INDICATION: Michael Horn is a 63 year old male with a history of transitional cell carcinoma of the bladder. He has had several recurrences over the years that were low grade and superficial (Ta, G1). He was found at the time of routine office cystoscopy for surveillance to have what appeared to be a recurrence on the anterior wall at approximately the 11:00 position. His brought to the operating room for biopsy of the lesion and fulguration.  ANESTHESIA:  General  EBL:  Minimal  DRAINS: None  LOCAL MEDICATIONS USED:  2% lidocaine jelly per urethra  SPECIMEN: Biopsy of lesion to pathology   Description of procedure: After informed consent the patient was taken to the operating room and placed on the table in a supine position. General anesthesia was then administered. Once fully anesthetized the patient was moved to the dorsal lithotomy position and the genitalia were sterilely prepped and draped in standard fashion. An official timeout was then performed.  The 33 French cystoscope was then passed under direct vision with a 12 lens the urethra which was noted be normal. The prostatic urethra revealed bilobar hypertrophy with a fairly high bladder neck. No lesions were noted within the prostatic urethra. Upon entering the bladder I noted normal-appearing mucosa with bilateral ureteral orifices of normal configuration and position. With a 12 lens I could not visualize the lesion. I therefore inserted the 70 lens and was able to see the lesion. It was located in a difficult position just inside the bladder neck anteriorly and I did not feel that I would be able to see the lesion with the 12 lens.  Removed the rigid cystoscope and inserted the flexible cystoscope. I was  able to biopsy the lesion using the Bigopsy forceps. 2 specimens were obtained although it was difficult I felt I was able to obtain adequate specimen. I then removed the flexible cystoscope and reinserted the rigid cystoscope with the 70 lens and the deflecting bridge. I passed the [place] through the cystoscope and was able to fulgurate the entire area bt maintaining the bladder partially filled. The area was easily visualized with a 70 lens and I was able to treat the entire area and some of the surrounding mucosa with fulguration. I then removed the cystoscope, placed 2% lidocaine jelly in the urethra and applied a penile clamp. The patient was awakened and taken to recovery room in stable and satisfactory condition. He tolerated the procedure well no intraoperative complications. PLAN OF CARE: Discharge to home after PACU  PATIENT DISPOSITION:  PACU - hemodynamically stable.

## 2013-11-22 NOTE — Discharge Instructions (Addendum)
Post Bladder Surgery Instructions ° ° °General instructions: °   ° Your recent bladder surgery requires very little post hospital care but some definite precautions. ° °Despite the fact that no skin incisions were used, the area around the bladder incisions are raw and covered with scabs to promote healing and prevent bleeding. Certain precautions are needed to insure that the scabs are not disturbed over the next 2-4 weeks while the healing proceeds. ° °Because the raw surface inside your bladder and the irritating effects of urine you may expect frequency of urination and/or urgency (a stronger desire to urinate) and perhaps even getting up at night more often. This will usually resolve or improve slowly over the healing period. You may see some blood in your urine over the first 6 weeks. Do not be alarmed, even if the urine was clear for a while. Get off your feet and drink lots of fluids until clearing occurs. If you start to pass clots or don't improve call us. ° °Catheter: (If you are discharged with a catheter.) ° °1. Keep your catheter secured to your leg at all times with tape or the supplied strap. °2. You may experience leakage of urine around your catheter- as long as the  °catheter continues to drain, this is normal.  If your catheter stops draining  °go to the ER. °3. You may also have blood in your urine, even after it has been clear for  °several days; you may even pass some small blood clots or other material.  This  °is normal as well.  If this happens, sit down and drink plenty of water to help  °make urine to flush out your bladder.  If the blood in your urine becomes worse  °after doing this, contact our office or return to the ER. °4. You may use the leg bag (small bag) during the day, but use the large bag at  °night. ° °Diet: ° °You may return to your normal diet immediately. Because of the raw surface of your bladder, alcohol, spicy foods, foods high in acid and drinks with caffeine may  cause irritation or frequency and should be used in moderation. To keep your urine flowing freely and avoid constipation, drink plenty of fluids during the day (8-10 glasses). Tip: Avoid cranberry juice because it is very acidic. ° °Activity: ° °Your physical activity doesn't need to be restricted. However, if you are very active, you may see some blood in the urine. We suggest that you reduce your activity under the circumstances until the bleeding has stopped. ° °Bowels: ° °It is important to keep your bowels regular during the postoperative period. Straining with bowel movements can cause bleeding. A bowel movement every other day is reasonable. Use a mild laxative if needed, such as milk of magnesia 2-3 tablespoons, or 2 Dulcolax tablets. Call if you continue to have problems. If you had been taking narcotics for pain, before, during or after your surgery, you may be constipated. Take a laxative if necessary. ° ° ° °Medication: ° °You should resume your pre-surgery medications unless told not to. In addition you may be given an antibiotic to prevent or treat infection. Antibiotics are not always necessary. All medication should be taken as prescribed until the bottles are finished unless you are having an unusual reaction to one of the drugs. ° ° °Post Anesthesia Home Care Instructions ° °Activity: °Get plenty of rest for the remainder of the day. A responsible adult should stay with you for   24 hours following the procedure.  °For the next 24 hours, DO NOT: °-Drive a car °-Operate machinery °-Drink alcoholic beverages °-Take any medication unless instructed by your physician °-Make any legal decisions or sign important papers. ° °Meals: °Start with liquid foods such as gelatin or soup. Progress to regular foods as tolerated. Avoid greasy, spicy, heavy foods. If nausea and/or vomiting occur, drink only clear liquids until the nausea and/or vomiting subsides. Call your physician if vomiting continues. ° °Special  Instructions/Symptoms: °Your throat may feel dry or sore from the anesthesia or the breathing tube placed in your throat during surgery. If this causes discomfort, gargle with warm salt water. The discomfort should disappear within 24 hours. ° ° °

## 2013-11-22 NOTE — Anesthesia Procedure Notes (Signed)
Procedure Name: LMA Insertion Date/Time: 11/22/2013 10:55 AM Performed by: Wanita Chamberlain Pre-anesthesia Checklist: Patient identified, Timeout performed, Emergency Drugs available, Suction available and Patient being monitored Patient Re-evaluated:Patient Re-evaluated prior to inductionOxygen Delivery Method: Circle system utilized Preoxygenation: Pre-oxygenation with 100% oxygen Intubation Type: IV induction Ventilation: Mask ventilation without difficulty LMA: LMA inserted LMA Size: 5.0 Number of attempts: 1 Placement Confirmation: positive ETCO2 Tube secured with: Tape

## 2013-11-22 NOTE — Anesthesia Preprocedure Evaluation (Addendum)
Anesthesia Evaluation  Patient identified by MRN, date of birth, ID band Patient awake    Reviewed: Allergy & Precautions, H&P , NPO status , Patient's Chart, lab work & pertinent test results, reviewed documented beta blocker date and time   History of Anesthesia Complications (+) Emergence Delirium and history of anesthetic complications  Airway Mallampati: III TM Distance: >3 FB Neck ROM: Full    Dental no notable dental hx. (+) Edentulous Lower, Partial Upper, Dental Advisory Given   Pulmonary shortness of breath, sleep apnea , COPDCurrent Smoker,  Mild OSA breath sounds clear to auscultation  Pulmonary exam normal       Cardiovascular Exercise Tolerance: Good hypertension, On Medications + CAD Rhythm:Regular Rate:Normal     Neuro/Psych Depression HX of Emergence Delirium Neuromuscular disease negative neurological ROS  negative psych ROS   GI/Hepatic negative GI ROS, Neg liver ROS, hiatal hernia, GERD-  Medicated and Poorly Controlled,  Endo/Other  diabetes, Type 2, Oral Hypoglycemic AgentsMorbid obesity  Renal/GU negative Renal ROS  negative genitourinary   Musculoskeletal negative musculoskeletal ROS (+) Arthritis -,   Abdominal (+) + obese,   Peds  Hematology negative hematology ROS (+)   Anesthesia Other Findings   Reproductive/Obstetrics negative OB ROS                      Anesthesia Physical Anesthesia Plan  ASA: III  Anesthesia Plan: General   Post-op Pain Management:    Induction: Intravenous  Airway Management Planned: LMA  Additional Equipment:   Intra-op Plan:   Post-operative Plan:   Informed Consent: I have reviewed the patients History and Physical, chart, labs and discussed the procedure including the risks, benefits and alternatives for the proposed anesthesia with the patient or authorized representative who has indicated his/her understanding and acceptance.    Dental Advisory Given  Plan Discussed with: CRNA and Surgeon  Anesthesia Plan Comments:         Anesthesia Quick Evaluation

## 2013-11-23 ENCOUNTER — Encounter (HOSPITAL_BASED_OUTPATIENT_CLINIC_OR_DEPARTMENT_OTHER): Payer: Self-pay | Admitting: Urology

## 2013-12-31 ENCOUNTER — Other Ambulatory Visit: Payer: Self-pay | Admitting: Family Medicine

## 2013-12-31 DIAGNOSIS — R05 Cough: Secondary | ICD-10-CM

## 2013-12-31 DIAGNOSIS — R059 Cough, unspecified: Secondary | ICD-10-CM

## 2014-01-06 ENCOUNTER — Ambulatory Visit
Admission: RE | Admit: 2014-01-06 | Discharge: 2014-01-06 | Disposition: A | Discharge status: Home / Self Care | Payer: BC Managed Care – PPO | Source: Ambulatory Visit | Attending: Family Medicine | Admitting: Family Medicine

## 2014-01-06 DIAGNOSIS — R059 Cough, unspecified: Secondary | ICD-10-CM

## 2014-01-06 DIAGNOSIS — R05 Cough: Secondary | ICD-10-CM

## 2014-01-13 ENCOUNTER — Encounter (HOSPITAL_COMMUNITY): Payer: Self-pay | Admitting: *Deleted

## 2014-01-13 ENCOUNTER — Emergency Department (HOSPITAL_COMMUNITY)
Admission: EM | Admit: 2014-01-13 | Discharge: 2014-01-14 | Disposition: A | Discharge status: Home / Self Care | Payer: BC Managed Care – PPO | Attending: Emergency Medicine | Admitting: Emergency Medicine

## 2014-01-13 ENCOUNTER — Ambulatory Visit
Admission: RE | Admit: 2014-01-13 | Discharge: 2014-01-13 | Disposition: A | Discharge status: Home / Self Care | Payer: BC Managed Care – PPO | Source: Ambulatory Visit | Attending: Family Medicine | Admitting: Family Medicine

## 2014-01-13 DIAGNOSIS — J449 Chronic obstructive pulmonary disease, unspecified: Secondary | ICD-10-CM | POA: Insufficient documentation

## 2014-01-13 DIAGNOSIS — M1990 Unspecified osteoarthritis, unspecified site: Secondary | ICD-10-CM | POA: Diagnosis not present

## 2014-01-13 DIAGNOSIS — G8929 Other chronic pain: Secondary | ICD-10-CM | POA: Diagnosis not present

## 2014-01-13 DIAGNOSIS — Z8669 Personal history of other diseases of the nervous system and sense organs: Secondary | ICD-10-CM | POA: Diagnosis not present

## 2014-01-13 DIAGNOSIS — Z7982 Long term (current) use of aspirin: Secondary | ICD-10-CM | POA: Insufficient documentation

## 2014-01-13 DIAGNOSIS — R739 Hyperglycemia, unspecified: Secondary | ICD-10-CM

## 2014-01-13 DIAGNOSIS — Z8551 Personal history of malignant neoplasm of bladder: Secondary | ICD-10-CM | POA: Diagnosis not present

## 2014-01-13 DIAGNOSIS — Z794 Long term (current) use of insulin: Secondary | ICD-10-CM | POA: Diagnosis not present

## 2014-01-13 DIAGNOSIS — K219 Gastro-esophageal reflux disease without esophagitis: Secondary | ICD-10-CM | POA: Diagnosis not present

## 2014-01-13 DIAGNOSIS — I2510 Atherosclerotic heart disease of native coronary artery without angina pectoris: Secondary | ICD-10-CM | POA: Insufficient documentation

## 2014-01-13 DIAGNOSIS — I10 Essential (primary) hypertension: Secondary | ICD-10-CM | POA: Diagnosis not present

## 2014-01-13 DIAGNOSIS — Z79899 Other long term (current) drug therapy: Secondary | ICD-10-CM | POA: Diagnosis not present

## 2014-01-13 DIAGNOSIS — F329 Major depressive disorder, single episode, unspecified: Secondary | ICD-10-CM | POA: Diagnosis not present

## 2014-01-13 DIAGNOSIS — Z720 Tobacco use: Secondary | ICD-10-CM | POA: Insufficient documentation

## 2014-01-13 DIAGNOSIS — Z7951 Long term (current) use of inhaled steroids: Secondary | ICD-10-CM | POA: Diagnosis not present

## 2014-01-13 DIAGNOSIS — E1165 Type 2 diabetes mellitus with hyperglycemia: Secondary | ICD-10-CM | POA: Insufficient documentation

## 2014-01-13 LAB — URINALYSIS, ROUTINE W REFLEX MICROSCOPIC
Bilirubin Urine: NEGATIVE
Glucose, UA: >1000 mg/dL — AB
KETONES UR: NEGATIVE mg/dL
LEUKOCYTES UA: NEGATIVE
Nitrite: NEGATIVE
PROTEIN: NEGATIVE mg/dL
Specific Gravity, Urine: 1.037 — ABNORMAL HIGH (ref 1.005–1.030)
Urobilinogen, UA: 0.2 mg/dL (ref 0.0–1.0)
pH: 5.5 (ref 5.0–8.0)

## 2014-01-13 LAB — URINE MICROSCOPIC-ADD ON

## 2014-01-13 LAB — CBC
HEMATOCRIT: 34 % — AB (ref 39.0–52.0)
HEMOGLOBIN: 11.1 g/dL — AB (ref 13.0–17.0)
MCH: 23.6 pg — AB (ref 26.0–34.0)
MCHC: 32.6 g/dL (ref 30.0–36.0)
MCV: 72.2 fL — AB (ref 78.0–100.0)
Platelets: 262 10*3/uL (ref 150–400)
RBC: 4.71 MIL/uL (ref 4.22–5.81)
RDW: 15.5 % (ref 11.5–15.5)
WBC: 12.6 10*3/uL — ABNORMAL HIGH (ref 4.0–10.5)

## 2014-01-13 LAB — COMPREHENSIVE METABOLIC PANEL
ALT: 38 U/L (ref 0–53)
AST: 27 U/L (ref 0–37)
Albumin: 3.8 g/dL (ref 3.5–5.2)
Alkaline Phosphatase: 134 U/L — ABNORMAL HIGH (ref 39–117)
Anion gap: 16 — ABNORMAL HIGH (ref 5–15)
BUN: 22 mg/dL (ref 6–23)
CALCIUM: 10.4 mg/dL (ref 8.4–10.5)
CO2: 23 meq/L (ref 19–32)
CREATININE: 1.08 mg/dL (ref 0.50–1.35)
Chloride: 95 mEq/L — ABNORMAL LOW (ref 96–112)
GFR, EST AFRICAN AMERICAN: 82 mL/min — AB (ref >90)
GFR, EST NON AFRICAN AMERICAN: 71 mL/min — AB (ref >90)
Glucose, Bld: 604 mg/dL (ref 70–99)
Potassium: 4.7 mEq/L (ref 3.7–5.3)
Sodium: 134 mEq/L — ABNORMAL LOW (ref 137–147)
Total Bilirubin: <0.2 mg/dL — ABNORMAL LOW (ref 0.3–1.2)
Total Protein: 7 g/dL (ref 6.0–8.3)

## 2014-01-13 LAB — CBG MONITORING, ED
GLUCOSE-CAPILLARY: 438 mg/dL — AB (ref 70–99)
Glucose-Capillary: 380 mg/dL — ABNORMAL HIGH (ref 70–99)
Glucose-Capillary: 562 mg/dL (ref 70–99)
Glucose-Capillary: 598 mg/dL (ref 70–99)

## 2014-01-13 MED ORDER — IOHEXOL 300 MG/ML  SOLN
75.0000 mL | Freq: Once | INTRAMUSCULAR | Status: AC | PRN
  Administered 2014-01-13: 75 mL via INTRAVENOUS

## 2014-01-13 MED ORDER — NICOTINE 21 MG/24HR TD PT24
21.0000 mg | MEDICATED_PATCH | Freq: Every day | TRANSDERMAL | Status: DC
  Administered 2014-01-13: 21 mg via TRANSDERMAL
  Filled 2014-01-13: qty 1

## 2014-01-13 MED ORDER — INSULIN ASPART 100 UNIT/ML ~~LOC~~ SOLN
10.0000 [IU] | Freq: Once | SUBCUTANEOUS | Status: AC
  Administered 2014-01-14: 10 [IU] via SUBCUTANEOUS
  Filled 2014-01-13: qty 1

## 2014-01-13 MED ORDER — INSULIN ASPART 100 UNIT/ML ~~LOC~~ SOLN
20.0000 [IU] | Freq: Once | SUBCUTANEOUS | Status: AC
  Administered 2014-01-13: 20 [IU] via SUBCUTANEOUS
  Filled 2014-01-13: qty 1

## 2014-01-13 MED ORDER — SODIUM CHLORIDE 0.9 % IV BOLUS (SEPSIS)
1000.0000 mL | Freq: Once | INTRAVENOUS | Status: AC
Start: 2014-01-13 — End: 2014-01-14
  Administered 2014-01-13: 1000 mL via INTRAVENOUS

## 2014-01-13 NOTE — ED Notes (Addendum)
604 glucose called from lab. RN Oxendine made aware.

## 2014-01-13 NOTE — ED Provider Notes (Signed)
CSN: [id number]     Arrival date & time 01/13/14  2114 History   First MD Initiated Contact with Patient 01/13/14 2141     Chief Complaint  Patient presents with  . Hyperglycemia     (Consider location/radiation/quality/duration/timing/severity/associated sxs/prior Treatment) The history is provided by the patient and medical records. No language interpreter was used.     Michael Horn is a 63 y.o. male  with a hx of IDDM, HTN, COPD, GERD, PUD, arthritis, CAD presents to the Emergency Department complaining of gradual, persistent, progressively worsening high blood sugar onset this AM.  Pt reports that when he took his CBG this morning it was > 600.  Pt reports he had a CT scan of his chest this AM due to his smoking Hx.  He was told not to take his metformin because of the CT.  He was given benadryl and prednisone as premedication for the CT, taken last night and again this morning.  He reports no complications with the CT.  He also reports that he did not know the prednisone would make his CBG rise.  No associated symptoms. Pt denies fever, chills, headache, neck pain, chest pain, shortness of breath, abdominal pain, nausea, vomiting, diarrhea, weakness, dizziness, syncope, dysuria, hematuria.     Past Medical History  Diagnosis Date  . Diabetes mellitus   . Hypertension   . COPD (chronic obstructive pulmonary disease)   . Peripheral neuropathy BOTTOM RIGHT FOOT  . GERD (gastroesophageal reflux disease)   . H/O hiatal hernia   . History of peptic ulcer AGE 37  . Bladder tumor   . Arthritis   . Depression   . Short of breath on exertion   . Coronary artery disease CARDIOLOGIST- DR Irish Lack  . Chronic knee pain RIGHT KNEE --  S/P KNEE REPLACEMENT JAN 2012--  PT STATES  NEEDS ANOTHER REPLACEMENT DUE TO JOINT RECALL  . History of bladder cancer TCC OF BLADDER  --  FOLLOWED BY DR Karsten Ro  . Complication of anesthesia EMERGENCE DELIRIUM  . Mild obstructive sleep apnea     per study  09-26-2007--  no cpap rx  . Nocturia   . Urgency of urination    Past Surgical History  Procedure Laterality Date  . Repair recurrent left rotator cuff tear  01-02-11  . Transurethral resection of bladder tumor  07-06-10  . Total knee arthroplasty  04-03-10    RIGHT  . Carpal tunnel release  04-11-2006    RIGHT  . Sigmoid colectomy  09-29-2001    DIVERTICULITIS  . Back surgery  X2  . Left eye surg.   2008    REMOVAL OF BB  . Bilateral shoulder surg.  2003  . Bilateral knee arthroscopy    . Left carpal. tunnel release    . Cardiac catheterization  04-13-2007   ---- DR Irish Lack    NO SIGNIFICANT CAD/ NORMAL LVF  . Transurethral resection of bladder tumor  05/20/2011    Procedure: TRANSURETHRAL RESECTION OF BLADDER TUMOR (TURBT);  Surgeon: Claybon Jabs, MD;  Location: [facility];  Service: Urology;  Laterality: N/A;  GYRUS INSTILL MYTOMYCIN C NO BED  . Transurethral resection of bladder tumor  01/31/2012    Procedure: TRANSURETHRAL RESECTION OF BLADDER TUMOR (TURBT);  Surgeon: Claybon Jabs, MD;  Location: [facility];  Service: Urology;  Laterality: N/A;  . Transurethral resection of bladder tumor with gyrus (turbt-gyrus) N/A 11/22/2013    Procedure: Bladder biopsy with fulgeration;  Surgeon:  Claybon Jabs, MD;  Location: [facility];  Service: Urology;  Laterality: N/A;   Family History  Problem Relation Age of Onset  . Heart attack Mother    History  Substance Use Topics  . Smoking status: Current Some Day Smoker -- 1.50 packs/day for 50 years    Types: Cigarettes  . Smokeless tobacco: Never Used  . Alcohol Use: No    Review of Systems  Constitutional: Negative for fever, diaphoresis, appetite change, fatigue and unexpected weight change.  HENT: Negative for mouth sores.   Eyes: Negative for visual disturbance.  Respiratory: Negative for cough, chest tightness, shortness of breath and wheezing.   Cardiovascular: Negative  for chest pain.  Gastrointestinal: Negative for nausea, vomiting, abdominal pain, diarrhea and constipation.  Endocrine: Negative for polydipsia, polyphagia and polyuria.  Genitourinary: Negative for dysuria, urgency, frequency and hematuria.  Musculoskeletal: Negative for back pain and neck stiffness.  Skin: Negative for rash.  Allergic/Immunologic: Negative for immunocompromised state.  Neurological: Negative for syncope, light-headedness and headaches.  Hematological: Does not bruise/bleed easily.  Psychiatric/Behavioral: Negative for sleep disturbance. The patient is not nervous/anxious.       Allergies  Contrast media  Home Medications   Prior to Admission medications   Medication Sig Start Date End Date Taking? Authorizing Provider  ALPRAZolam Duanne Moron) 1 MG tablet Take 1 mg by mouth 3 (three) times daily as needed.    Yes Historical Provider, MD  Aspirin-Acetaminophen-Caffeine (GOODY HEADACHE PO) Take 1 tablet by mouth as needed (headache).   Yes Historical Provider, MD  buPROPion (WELLBUTRIN XL) 150 MG 24 hr tablet Take 150 mg by mouth daily.  04/26/13  Yes Historical Provider, MD  Casanthranol-Docusate Sodium 30-100 MG CAPS Take by mouth as needed.   Yes Historical Provider, MD  cyclobenzaprine (FLEXERIL) 10 MG tablet Take 5 mg by mouth 3 (three) times daily as needed for muscle spasms.    Yes Historical Provider, MD  esomeprazole (NEXIUM) 40 MG capsule Take 40 mg by mouth 2 (two) times daily before a meal.    Yes Historical Provider, MD  fenofibrate 160 MG tablet Take 1 tablet (160 mg total) by mouth daily. 05/05/13  Yes Jettie Booze, MD  fluticasone (FLOVENT HFA) 44 MCG/ACT inhaler Inhale into the lungs 2 (two) times daily.   Yes Historical Provider, MD  insulin detemir (LEVEMIR) 100 UNIT/ML injection Inject 24 Units into the skin at bedtime.    Yes Historical Provider, MD  lisinopril (PRINIVIL,ZESTRIL) 5 MG tablet Take 5 mg by mouth daily.   Yes Historical Provider, MD   metFORMIN (GLUCOPHAGE) 850 MG tablet Take 850 mg by mouth 2 (two) times daily with a meal.   Yes Historical Provider, MD  morphine (MSIR) 15 MG tablet Take 15 mg by mouth 2 (two) times daily.  04/24/13  Yes Historical Provider, MD  phenazopyridine (PYRIDIUM) 200 MG tablet Take 1 tablet (200 mg total) by mouth 3 (three) times daily as needed for pain. 11/22/13  Yes Claybon Jabs, MD  pravastatin (PRAVACHOL) 40 MG tablet Take 80 mg by mouth daily.   Yes Historical Provider, MD  pregabalin (LYRICA) 150 MG capsule Take 150 mg by mouth 2 (two) times daily.   Yes Historical Provider, MD  tiotropium (SPIRIVA) 18 MCG inhalation capsule Place 18 mcg into inhaler and inhale every evening.    Yes Historical Provider, MD  glimepiride (AMARYL) 1 MG tablet Take 1 mg by mouth daily.     Historical Provider, MD  HYDROcodone-acetaminophen [facility])  10-325 MG per tablet Take 1-2 tablets by mouth every 4 (four) hours as needed for moderate pain. Maximum dose per 24 hours - 8 pills 11/22/13   Claybon Jabs, MD  pravastatin (PRAVACHOL) 80 MG tablet Take 80 mg by mouth every evening.    Historical Provider, MD   BP 118/62 mmHg  Pulse 68  Temp(Src) 97.9 F (36.6 C) (Oral)  Resp 14  Ht 5\' 10"  (1.778 m)  Wt 245 lb (111.131 kg)  BMI 35.15 kg/m2  SpO2 99% Physical Exam  Constitutional: He appears well-developed and well-nourished. No distress.  Awake, alert, nontoxic appearance  HENT:  Head: Normocephalic and atraumatic.  Mouth/Throat: Oropharynx is clear and moist. No oropharyngeal exudate.  Moist mucous membranes  Eyes: Conjunctivae are normal. No scleral icterus.  Neck: Normal range of motion. Neck supple.  Cardiovascular: Normal rate, regular rhythm, normal heart sounds and intact distal pulses.   No murmur heard. Pulmonary/Chest: Effort normal and breath sounds normal. No respiratory distress. He has no wheezes.  Equal chest expansion  Abdominal: Soft. Bowel sounds are normal. He exhibits no mass. There is  no tenderness. There is no rebound and no guarding.  Abd soft and nontender Obese  Musculoskeletal: Normal range of motion. He exhibits no edema.  Neurological: He is alert. He exhibits normal muscle tone. Coordination normal.  Speech is clear and goal oriented Moves extremities without ataxia  Skin: Skin is warm and dry. He is not diaphoretic. No erythema.  Psychiatric: He has a normal mood and affect.  Nursing note and vitals reviewed.   ED Course  Procedures (including critical care time) Labs Review Labs Reviewed  CBC - Abnormal; Notable for the following:    WBC 12.6 (*)    Hemoglobin 11.1 (*)    HCT 34.0 (*)    MCV 72.2 (*)    MCH 23.6 (*)    All other components within normal limits  COMPREHENSIVE METABOLIC PANEL - Abnormal; Notable for the following:    Sodium 134 (*)    Chloride 95 (*)    Glucose, Bld 604 (*)    Alkaline Phosphatase 134 (*)    Total Bilirubin <0.2 (*)    GFR calc non Af Amer 71 (*)    GFR calc Af Amer 82 (*)    Anion gap 16 (*)    All other components within normal limits  URINALYSIS, ROUTINE W REFLEX MICROSCOPIC - Abnormal; Notable for the following:    APPearance CLOUDY (*)    Specific Gravity, Urine 1.037 (*)    Glucose, UA >1000 (*)    Hgb urine dipstick TRACE (*)    All other components within normal limits  CBG MONITORING, ED - Abnormal; Notable for the following:    Glucose-Capillary 598 (*)    All other components within normal limits  CBG MONITORING, ED - Abnormal; Notable for the following:    Glucose-Capillary 562 (*)    All other components within normal limits  CBG MONITORING, ED - Abnormal; Notable for the following:    Glucose-Capillary 438 (*)    All other components within normal limits  CBG MONITORING, ED - Abnormal; Notable for the following:    Glucose-Capillary 380 (*)    All other components within normal limits  CBG MONITORING, ED - Abnormal; Notable for the following:    Glucose-Capillary 291 (*)    All other  components within normal limits  URINE MICROSCOPIC-ADD ON  CBG MONITORING, ED    Imaging Review Ct Chest W Contrast  01/13/2014   CLINICAL DATA:  Chronic cough, smoker with approximately 75 pack-year history, green and brown sputum. History of bladder cancer 2012.  EXAM: CT CHEST WITH CONTRAST  TECHNIQUE: Multidetector CT imaging of the chest was performed during intravenous contrast administration.  CONTRAST:  15mL OMNIPAQUE IOHEXOL 300 MG/ML  SOLN  COMPARISON:  Chest radiograph 12/13/2011. No prior chest CT for comparison  FINDINGS: On the first image of the exam, a subcutaneous oval mass is partly visualized on image 1 which could represent a sebaceous cyst but is not further evaluated.  Great vessels are normal in caliber. Mild atheromatous aortic and coronary arterial calcifications are identified. Heart size is normal. No pericardial or pleural effusion. No lymphadenopathy.  At incomplete imaging of the upper abdomen, 1 cm ill-defined hypodensity at the dome of the left hepatic lobe image 50 is most likely related to volume averaging from the adjacent diaphragm and lung base, better seen on image 49. The liver is hypodense which may suggest fatty infiltration.  Diffuse emphysematous changes are noted. The central airways are patent. No mass, nodule, or consolidation is identified.  No lytic or sclerotic osseous lesion or acute osseous abnormality. Schmorl's node formation noted associated with degenerative endplate change.  IMPRESSION: Emphysematous change without focal acute finding.   Electronically Signed   By: Conchita Paris M.D.   On: 01/13/2014 10:28     EKG Interpretation None      MDM   Final diagnoses:  Hyperglycemia without ketosis   Michael Horn presents with hyperglycemia, steroid induced.  Will give fluids and insulin and reassess.  Pt without systemic symptoms and no Hx of DKA.  Pt reports well controlled CBGs in the past.   11:57 PM Pt CBG remains > 400.  Will give  more insulin and reassess.    12:51 AM Pt CBG 380.  Will continue to give fluids.  Discussed with pt my concerns about his CBG, but lack of DKA.  Admission offered, but pt declines.  Will recheck CBG in 20 min.  If we are able to decrease CBG to under 300, pt feels comfortable going home and has a f/u appointment at 11am tomorrow.  If we are unable to decrease less than 300, will discuss admission again.    1:21 AM Pt CBG 291.  He reports feeling well, has tolerated PO here and will follow-up in the AM. Pt is to return to the ED if his CBG rises significantly again and/or he begins to have symptoms.    I have personally reviewed patient's vitals, nursing note and any pertinent labs or imaging.  I performed an undressed physical exam.    It has been determined that no acute conditions requiring further emergency intervention are present at this time. The patient/guardian have been advised of the diagnosis and plan. I reviewed all labs and imaging including any potential incidental findings. We have discussed signs and symptoms that warrant return to the ED and they are listed in the discharge instructions.    Vital signs are stable at discharge.   BP 118/62 mmHg  Pulse 68  Temp(Src) 97.9 F (36.6 C) (Oral)  Resp 14  Ht 5\' 10"  (1.778 m)  Wt 245 lb (111.131 kg)  BMI 35.15 kg/m2  SpO2 99%  The patient was discussed with Dr. Betsey Holiday who agrees with the treatment plan.   Jarrett Soho Atheena Spano, PA-C 01/14/14 0129  Orpah Greek, MD 01/14/14 [phone]

## 2014-01-13 NOTE — ED Notes (Signed)
Pt states that he took his blood sugar at home and it read > 600; pt states that he had a CT scan (pt reports CT of Chest this am due to chronic cough and smoking hx) this morning and was advised not to take Metformin and pt reports that he is allergic to the contrast dye and was given Benadryl and Prednisone this am; pt c/o headache

## 2014-01-13 NOTE — ED Notes (Signed)
Notified RN, Tiffany pt. CBG 562.

## 2014-01-14 LAB — CBG MONITORING, ED: GLUCOSE-CAPILLARY: 291 mg/dL — AB (ref 70–99)

## 2014-01-14 NOTE — Discharge Instructions (Signed)
1. Medications: usual home medications 2. Treatment: rest, drink plenty of fluids, take your insulin as directed, hold metformin for the next 48 hours 3. Follow Up: Please followup with your primary doctor tomorrow for discussion of your diagnoses and further evaluation after today's visit; if you do not have a primary care doctor use the resource guide provided to find one; Please return to the ER for increasing blood sugars, fatigue or other concerning symptoms    Hyperglycemia Hyperglycemia occurs when the glucose (sugar) in your blood is too high. Hyperglycemia can happen for many reasons, but it most often happens to people who do not know they have diabetes or are not managing their diabetes properly.  CAUSES  Whether you have diabetes or not, there are other causes of hyperglycemia. Hyperglycemia can occur when you have diabetes, but it can also occur in other situations that you might not be as aware of, such as: Diabetes  If you have diabetes and are having problems controlling your blood glucose, hyperglycemia could occur because of some of the following reasons:  Not following your meal plan.  Not taking your diabetes medications or not taking it properly.  Exercising less or doing less activity than you normally do.  Being sick. Pre-diabetes  This cannot be ignored. Before people develop Type 2 diabetes, they almost always have "pre-diabetes." This is when your blood glucose levels are higher than normal, but not yet high enough to be diagnosed as diabetes. Research has shown that some long-term damage to the body, especially the heart and circulatory system, may already be occurring during pre-diabetes. If you take action to manage your blood glucose when you have pre-diabetes, you may delay or prevent Type 2 diabetes from developing. Stress  If you have diabetes, you may be "diet" controlled or on oral medications or insulin to control your diabetes. However, you may find that  your blood glucose is higher than usual in the hospital whether you have diabetes or not. This is often referred to as "stress hyperglycemia." Stress can elevate your blood glucose. This happens because of hormones put out by the body during times of stress. If stress has been the cause of your high blood glucose, it can be followed regularly by your caregiver. That way he/she can make sure your hyperglycemia does not continue to get worse or progress to diabetes. Steroids  Steroids are medications that act on the infection fighting system (immune system) to block inflammation or infection. One side effect can be a rise in blood glucose. Most people can produce enough extra insulin to allow for this rise, but for those who cannot, steroids make blood glucose levels go even higher. It is not unusual for steroid treatments to "uncover" diabetes that is developing. It is not always possible to determine if the hyperglycemia will go away after the steroids are stopped. A special blood test called an A1c is sometimes done to determine if your blood glucose was elevated before the steroids were started. SYMPTOMS  Thirsty.  Frequent urination.  Dry mouth.  Blurred vision.  Tired or fatigue.  Weakness.  Sleepy.  Tingling in feet or leg. DIAGNOSIS  Diagnosis is made by monitoring blood glucose in one or all of the following ways:  A1c test. This is a chemical found in your blood.  Fingerstick blood glucose monitoring.  Laboratory results. TREATMENT  First, knowing the cause of the hyperglycemia is important before the hyperglycemia can be treated. Treatment may include, but is not be limited  to:  Education.  Change or adjustment in medications.  Change or adjustment in meal plan.  Treatment for an illness, infection, etc.  More frequent blood glucose monitoring.  Change in exercise plan.  Decreasing or stopping steroids.  Lifestyle changes. HOME CARE INSTRUCTIONS   Test your  blood glucose as directed.  Exercise regularly. Your caregiver will give you instructions about exercise. Pre-diabetes or diabetes which comes on with stress is helped by exercising.  Eat wholesome, balanced meals. Eat often and at regular, fixed times. Your caregiver or nutritionist will give you a meal plan to guide your sugar intake.  Being at an ideal weight is important. If needed, losing as little as 10 to 15 pounds may help improve blood glucose levels. SEEK MEDICAL CARE IF:   You have questions about medicine, activity, or diet.  You continue to have symptoms (problems such as increased thirst, urination, or weight gain). SEEK IMMEDIATE MEDICAL CARE IF:   You are vomiting or have diarrhea.  Your breath smells fruity.  You are breathing faster or slower.  You are very sleepy or incoherent.  You have numbness, tingling, or pain in your feet or hands.  You have chest pain.  Your symptoms get worse even though you have been following your caregiver's orders.  If you have any other questions or concerns. Document Released: 08/21/2000 Document Revised: 05/20/2011 Document Reviewed: 06/24/2011 [facility] Patient Information 2015 [place], [place]. This information is not intended to replace advice given to you by your health care provider. Make sure you discuss any questions you have with your health care provider.   Blood Glucose Monitoring Monitoring your blood glucose (also know as blood sugar) helps you to manage your diabetes. It also helps you and your health care provider monitor your diabetes and determine how well your treatment plan is working. WHY SHOULD YOU MONITOR YOUR BLOOD GLUCOSE?  It can help you understand how food, exercise, and medicine affect your blood glucose.  It allows you to know what your blood glucose is at any given moment. You can quickly tell if you are having low blood glucose (hypoglycemia) or high blood glucose (hyperglycemia).  It can help you  and your health care provider know how to adjust your medicines.  It can help you understand how to manage an illness or adjust medicine for exercise. WHEN SHOULD YOU TEST? Your health care provider will help you decide how often you should check your blood glucose. This may depend on the type of diabetes you have, your diabetes control, or the types of medicines you are taking. Be sure to write down all of your blood glucose readings so that this information can be reviewed with your health care provider. See below for examples of testing times that your health care provider may suggest. Type 1 Diabetes  Test 4 times a day if you are in good control, using an insulin pump, or perform multiple daily injections.  If your diabetes is not well controlled or if you are sick, you may need to monitor more often.  It is a good idea to also monitor:  Before and after exercise.  Between meals and 2 hours after a meal.  Occasionally between 2:00 a.m. and 3:00 a.m. Type 2 Diabetes  It can vary with each person, but generally, if you are on insulin, test 4 times a day.  If you take medicines by mouth (orally), test 2 times a day.  If you are on a controlled diet, test once a  day.  If your diabetes is not well controlled or if you are sick, you may need to monitor more often. HOW TO MONITOR YOUR BLOOD GLUCOSE Supplies Needed  Blood glucose meter.  Test strips for your meter. Each meter has its own strips. You must use the strips that go with your own meter.  A pricking needle (lancet).  A device that holds the lancet (lancing device).  A journal or log book to write down your results. Procedure  Wash your hands with soap and water. Alcohol is not preferred.  Prick the side of your finger (not the tip) with the lancet.  Gently milk the finger until a small drop of blood appears.  Follow the instructions that come with your meter for inserting the test strip, applying blood to the  strip, and using your blood glucose meter. Other Areas to Get Blood for Testing Some meters allow you to use other areas of your body (other than your finger) to test your blood. These areas are called alternative sites. The most common alternative sites are:  The forearm.  The thigh.  The back area of the lower leg.  The palm of the hand. The blood flow in these areas is slower. Therefore, the blood glucose values you get may be delayed, and the numbers are different from what you would get from your fingers. Do not use alternative sites if you think you are having hypoglycemia. Your reading will not be accurate. Always use a finger if you are having hypoglycemia. Also, if you cannot feel your lows (hypoglycemia unawareness), always use your fingers for your blood glucose checks. ADDITIONAL TIPS FOR GLUCOSE MONITORING  Do not reuse lancets.  Always carry your supplies with you.  All blood glucose meters have a 24-hour "hotline" number to call if you have questions or need help.  Adjust (calibrate) your blood glucose meter with a control solution after finishing a few boxes of strips. BLOOD GLUCOSE RECORD KEEPING It is a good idea to keep a daily record or log of your blood glucose readings. Most glucose meters, if not all, keep your glucose records stored in the meter. Some meters come with the ability to download your records to your home computer. Keeping a record of your blood glucose readings is especially helpful if you are wanting to look for patterns. Make notes to go along with the blood glucose readings because you might forget what happened at that exact time. Keeping good records helps you and your health care provider to work together to achieve good diabetes management.  Document Released: 02/28/2003 Document Revised: 07/12/2013 Document Reviewed: 07/20/2012 [facility] Patient Information 2015 [place], [place]. This information is not intended to replace advice given to you by your  health care provider. Make sure you discuss any questions you have with your health care provider.

## 2014-02-22 DIAGNOSIS — C679 Malignant neoplasm of bladder, unspecified: Secondary | ICD-10-CM | POA: Diagnosis not present

## 2014-03-03 DIAGNOSIS — R109 Unspecified abdominal pain: Secondary | ICD-10-CM | POA: Diagnosis not present

## 2014-03-03 DIAGNOSIS — N281 Cyst of kidney, acquired: Secondary | ICD-10-CM | POA: Diagnosis not present

## 2014-03-03 DIAGNOSIS — R300 Dysuria: Secondary | ICD-10-CM | POA: Diagnosis not present

## 2014-03-03 DIAGNOSIS — N390 Urinary tract infection, site not specified: Secondary | ICD-10-CM | POA: Diagnosis not present

## 2014-03-03 DIAGNOSIS — R312 Other microscopic hematuria: Secondary | ICD-10-CM | POA: Diagnosis not present

## 2014-03-08 DIAGNOSIS — R300 Dysuria: Secondary | ICD-10-CM | POA: Diagnosis not present

## 2014-03-12 DIAGNOSIS — R109 Unspecified abdominal pain: Secondary | ICD-10-CM | POA: Diagnosis not present

## 2014-03-12 DIAGNOSIS — Z794 Long term (current) use of insulin: Secondary | ICD-10-CM | POA: Diagnosis not present

## 2014-03-12 DIAGNOSIS — I10 Essential (primary) hypertension: Secondary | ICD-10-CM | POA: Diagnosis not present

## 2014-03-12 DIAGNOSIS — R339 Retention of urine, unspecified: Secondary | ICD-10-CM | POA: Diagnosis not present

## 2014-03-12 DIAGNOSIS — R300 Dysuria: Secondary | ICD-10-CM | POA: Diagnosis not present

## 2014-03-12 DIAGNOSIS — E780 Pure hypercholesterolemia: Secondary | ICD-10-CM | POA: Diagnosis not present

## 2014-03-12 DIAGNOSIS — E119 Type 2 diabetes mellitus without complications: Secondary | ICD-10-CM | POA: Diagnosis not present

## 2014-03-17 DIAGNOSIS — N3281 Overactive bladder: Secondary | ICD-10-CM | POA: Diagnosis not present

## 2014-03-22 DIAGNOSIS — Z79891 Long term (current) use of opiate analgesic: Secondary | ICD-10-CM | POA: Diagnosis not present

## 2014-04-26 DIAGNOSIS — M25561 Pain in right knee: Secondary | ICD-10-CM | POA: Diagnosis not present

## 2014-04-26 DIAGNOSIS — M47817 Spondylosis without myelopathy or radiculopathy, lumbosacral region: Secondary | ICD-10-CM | POA: Diagnosis not present

## 2014-04-26 DIAGNOSIS — M65342 Trigger finger, left ring finger: Secondary | ICD-10-CM | POA: Diagnosis not present

## 2014-04-26 DIAGNOSIS — G894 Chronic pain syndrome: Secondary | ICD-10-CM | POA: Diagnosis not present

## 2014-04-26 DIAGNOSIS — M62830 Muscle spasm of back: Secondary | ICD-10-CM | POA: Diagnosis not present

## 2014-05-02 DIAGNOSIS — E1142 Type 2 diabetes mellitus with diabetic polyneuropathy: Secondary | ICD-10-CM | POA: Diagnosis not present

## 2014-05-23 DIAGNOSIS — M62830 Muscle spasm of back: Secondary | ICD-10-CM | POA: Diagnosis not present

## 2014-05-23 DIAGNOSIS — G894 Chronic pain syndrome: Secondary | ICD-10-CM | POA: Diagnosis not present

## 2014-05-23 DIAGNOSIS — M25561 Pain in right knee: Secondary | ICD-10-CM | POA: Diagnosis not present

## 2014-05-23 DIAGNOSIS — M47817 Spondylosis without myelopathy or radiculopathy, lumbosacral region: Secondary | ICD-10-CM | POA: Diagnosis not present

## 2014-05-31 DIAGNOSIS — C679 Malignant neoplasm of bladder, unspecified: Secondary | ICD-10-CM | POA: Diagnosis not present

## 2014-05-31 DIAGNOSIS — N400 Enlarged prostate without lower urinary tract symptoms: Secondary | ICD-10-CM | POA: Diagnosis not present

## 2014-06-20 DIAGNOSIS — Z79891 Long term (current) use of opiate analgesic: Secondary | ICD-10-CM | POA: Diagnosis not present

## 2014-06-20 DIAGNOSIS — M25561 Pain in right knee: Secondary | ICD-10-CM | POA: Diagnosis not present

## 2014-06-20 DIAGNOSIS — G894 Chronic pain syndrome: Secondary | ICD-10-CM | POA: Diagnosis not present

## 2014-06-20 DIAGNOSIS — M47817 Spondylosis without myelopathy or radiculopathy, lumbosacral region: Secondary | ICD-10-CM | POA: Diagnosis not present

## 2014-06-20 DIAGNOSIS — M62830 Muscle spasm of back: Secondary | ICD-10-CM | POA: Diagnosis not present

## 2014-07-04 DIAGNOSIS — E1142 Type 2 diabetes mellitus with diabetic polyneuropathy: Secondary | ICD-10-CM | POA: Diagnosis not present

## 2014-07-04 DIAGNOSIS — F17200 Nicotine dependence, unspecified, uncomplicated: Secondary | ICD-10-CM | POA: Diagnosis not present

## 2014-07-04 DIAGNOSIS — C679 Malignant neoplasm of bladder, unspecified: Secondary | ICD-10-CM | POA: Diagnosis not present

## 2014-07-04 DIAGNOSIS — J449 Chronic obstructive pulmonary disease, unspecified: Secondary | ICD-10-CM | POA: Diagnosis not present

## 2014-07-04 DIAGNOSIS — E780 Pure hypercholesterolemia: Secondary | ICD-10-CM | POA: Diagnosis not present

## 2014-07-04 DIAGNOSIS — Z6835 Body mass index (BMI) 35.0-35.9, adult: Secondary | ICD-10-CM | POA: Diagnosis not present

## 2014-07-04 DIAGNOSIS — I10 Essential (primary) hypertension: Secondary | ICD-10-CM | POA: Diagnosis not present

## 2014-07-04 DIAGNOSIS — F322 Major depressive disorder, single episode, severe without psychotic features: Secondary | ICD-10-CM | POA: Diagnosis not present

## 2014-07-04 DIAGNOSIS — K219 Gastro-esophageal reflux disease without esophagitis: Secondary | ICD-10-CM | POA: Diagnosis not present

## 2014-07-18 DIAGNOSIS — G894 Chronic pain syndrome: Secondary | ICD-10-CM | POA: Diagnosis not present

## 2014-07-18 DIAGNOSIS — M47817 Spondylosis without myelopathy or radiculopathy, lumbosacral region: Secondary | ICD-10-CM | POA: Diagnosis not present

## 2014-07-18 DIAGNOSIS — M25561 Pain in right knee: Secondary | ICD-10-CM | POA: Diagnosis not present

## 2014-07-18 DIAGNOSIS — M62830 Muscle spasm of back: Secondary | ICD-10-CM | POA: Diagnosis not present

## 2014-07-19 DIAGNOSIS — E1165 Type 2 diabetes mellitus with hyperglycemia: Secondary | ICD-10-CM | POA: Diagnosis not present

## 2014-07-19 DIAGNOSIS — H3561 Retinal hemorrhage, right eye: Secondary | ICD-10-CM | POA: Diagnosis not present

## 2014-07-19 DIAGNOSIS — H5203 Hypermetropia, bilateral: Secondary | ICD-10-CM | POA: Diagnosis not present

## 2014-08-04 DIAGNOSIS — E1142 Type 2 diabetes mellitus with diabetic polyneuropathy: Secondary | ICD-10-CM | POA: Diagnosis not present

## 2014-08-04 DIAGNOSIS — Z23 Encounter for immunization: Secondary | ICD-10-CM | POA: Diagnosis not present

## 2014-08-16 DIAGNOSIS — M47817 Spondylosis without myelopathy or radiculopathy, lumbosacral region: Secondary | ICD-10-CM | POA: Diagnosis not present

## 2014-08-16 DIAGNOSIS — M62830 Muscle spasm of back: Secondary | ICD-10-CM | POA: Diagnosis not present

## 2014-08-16 DIAGNOSIS — G894 Chronic pain syndrome: Secondary | ICD-10-CM | POA: Diagnosis not present

## 2014-08-16 DIAGNOSIS — M25561 Pain in right knee: Secondary | ICD-10-CM | POA: Diagnosis not present

## 2014-09-06 DIAGNOSIS — Z8551 Personal history of malignant neoplasm of bladder: Secondary | ICD-10-CM | POA: Diagnosis not present

## 2014-09-06 DIAGNOSIS — N400 Enlarged prostate without lower urinary tract symptoms: Secondary | ICD-10-CM | POA: Diagnosis not present

## 2014-10-11 DIAGNOSIS — M47817 Spondylosis without myelopathy or radiculopathy, lumbosacral region: Secondary | ICD-10-CM | POA: Diagnosis not present

## 2014-10-11 DIAGNOSIS — G894 Chronic pain syndrome: Secondary | ICD-10-CM | POA: Diagnosis not present

## 2014-10-11 DIAGNOSIS — M62830 Muscle spasm of back: Secondary | ICD-10-CM | POA: Diagnosis not present

## 2014-10-11 DIAGNOSIS — M25561 Pain in right knee: Secondary | ICD-10-CM | POA: Diagnosis not present

## 2014-11-09 DIAGNOSIS — M25561 Pain in right knee: Secondary | ICD-10-CM | POA: Diagnosis not present

## 2014-11-09 DIAGNOSIS — M47817 Spondylosis without myelopathy or radiculopathy, lumbosacral region: Secondary | ICD-10-CM | POA: Diagnosis not present

## 2014-11-09 DIAGNOSIS — G894 Chronic pain syndrome: Secondary | ICD-10-CM | POA: Diagnosis not present

## 2014-11-09 DIAGNOSIS — M62830 Muscle spasm of back: Secondary | ICD-10-CM | POA: Diagnosis not present

## 2014-11-16 DIAGNOSIS — M21371 Foot drop, right foot: Secondary | ICD-10-CM | POA: Diagnosis not present

## 2014-12-06 DIAGNOSIS — F17200 Nicotine dependence, unspecified, uncomplicated: Secondary | ICD-10-CM | POA: Diagnosis not present

## 2014-12-06 DIAGNOSIS — Z720 Tobacco use: Secondary | ICD-10-CM | POA: Diagnosis not present

## 2014-12-06 DIAGNOSIS — E1142 Type 2 diabetes mellitus with diabetic polyneuropathy: Secondary | ICD-10-CM | POA: Diagnosis not present

## 2014-12-06 DIAGNOSIS — Z716 Tobacco abuse counseling: Secondary | ICD-10-CM | POA: Diagnosis not present

## 2014-12-06 DIAGNOSIS — Z23 Encounter for immunization: Secondary | ICD-10-CM | POA: Diagnosis not present

## 2014-12-06 DIAGNOSIS — Z794 Long term (current) use of insulin: Secondary | ICD-10-CM | POA: Diagnosis not present

## 2014-12-06 DIAGNOSIS — L749 Eccrine sweat disorder, unspecified: Secondary | ICD-10-CM | POA: Diagnosis not present

## 2014-12-06 DIAGNOSIS — E1165 Type 2 diabetes mellitus with hyperglycemia: Secondary | ICD-10-CM | POA: Diagnosis not present

## 2014-12-12 ENCOUNTER — Ambulatory Visit (INDEPENDENT_AMBULATORY_CARE_PROVIDER_SITE_OTHER): Payer: Medicare Other | Admitting: Neurology

## 2014-12-12 ENCOUNTER — Encounter: Payer: Self-pay | Admitting: Neurology

## 2014-12-12 VITALS — BP 112/70 | HR 92 | Ht 70.0 in | Wt 237.0 lb

## 2014-12-12 DIAGNOSIS — E1142 Type 2 diabetes mellitus with diabetic polyneuropathy: Secondary | ICD-10-CM | POA: Diagnosis not present

## 2014-12-12 DIAGNOSIS — G5731 Lesion of lateral popliteal nerve, right lower limb: Secondary | ICD-10-CM | POA: Diagnosis not present

## 2014-12-12 DIAGNOSIS — IMO0002 Reserved for concepts with insufficient information to code with codable children: Secondary | ICD-10-CM

## 2014-12-12 DIAGNOSIS — E1169 Type 2 diabetes mellitus with other specified complication: Secondary | ICD-10-CM

## 2014-12-12 DIAGNOSIS — E1165 Type 2 diabetes mellitus with hyperglycemia: Secondary | ICD-10-CM | POA: Diagnosis not present

## 2014-12-12 NOTE — Progress Notes (Signed)
Michael Horn was seen today in neurologic consultation at the request of Dr. Marisue Humble.  His PCP is SHAW,KIMBERLEE, MD.  The consultation is for the evaluation of R foot drop.  The records that were made available to me were reviewed.  Pts sx's started the first week of September; [place] and he normally doesn't have to climb stairs and he noted that his shoe was falling off when he was climbing the stairs because he couldn't dorsiflex the foot.  About 2 weeks later, he started getting better and reports it is 50% better.  States that he is DM and his A1C went from 6.3 to 10.4 in the last 4 months.  Does admit to back pain with 2 back surgeries; last surgery in 1998 (not sure but thinks that L5-S1 and maybe L4-5).  He doesn't wear boots.  States that he has never walked well with the right foot; sounds like has significant diabetic peripheral neuropathy.  On lyrica, 150 mg bid.  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  Admits that leg is sore when he gets off but never thought that it could be a related issue   ALLERGIES:   Allergies  Allergen Reactions  . Contrast Media [Iodinated Diagnostic Agents] Rash    CURRENT MEDICATIONS:  Outpatient Encounter Prescriptions as of 12/12/2014  Medication Sig  . aspirin EC 81 MG tablet Take 81 mg by mouth every other day.  . citalopram (CELEXA) 20 MG tablet Take 20 mg by mouth daily.  . cyclobenzaprine (FLEXERIL) 10 MG tablet Take 5 mg by mouth 3 (three) times daily as needed for muscle spasms.   Marland Kitchen docusate sodium (COLACE) 100 MG capsule Take 100 mg by mouth daily.  Marland Kitchen esomeprazole (NEXIUM) 40 MG capsule Take 40 mg by mouth 2 (two) times daily before a meal.   . fenofibrate 160 MG tablet Take 1 tablet (160 mg total) by mouth daily.  . fluticasone  (FLOVENT HFA) 44 MCG/ACT inhaler Inhale into the lungs 2 (two) times daily.  . insulin aspart protamine- aspart (NOVOLOG MIX 70/30) (70-30) 100 UNIT/ML injection Inject into the skin.  Marland Kitchen lisinopril (PRINIVIL,ZESTRIL) 5 MG tablet Take 5 mg by mouth daily.  . metFORMIN (GLUCOPHAGE) 850 MG tablet Take 100 mg by mouth 2 (two) times daily with a meal.   . Oxycodone HCl 10 MG TABS Take by mouth as needed.  . pravastatin (PRAVACHOL) 40 MG tablet Take 80 mg by mouth daily.  . pregabalin (LYRICA) 150 MG capsule Take 150 mg by mouth 2 (two) times daily.  Marland Kitchen tiotropium (SPIRIVA) 18 MCG inhalation capsule Place 18 mcg into inhaler and inhale every evening.   . traZODone (DESYREL) 50 MG tablet Take 50 mg by mouth at bedtime.  . [DISCONTINUED] ALPRAZolam (XANAX) 1 MG tablet Take 1 mg by mouth 3 (three) times daily as needed.   . [DISCONTINUED] Aspirin-Acetaminophen-Caffeine (GOODY HEADACHE PO) Take 1 tablet by mouth as needed (headache).  . [DISCONTINUED] buPROPion (WELLBUTRIN XL) 150 MG 24 hr tablet Take 150 mg by mouth daily.   . [DISCONTINUED] Casanthranol-Docusate Sodium 30-100 MG CAPS Take by mouth as needed.  . [DISCONTINUED] glimepiride (AMARYL) 1 MG tablet Take 1 mg by mouth daily.   . [DISCONTINUED]  HYDROcodone-acetaminophen (NORCO) 10-325 MG per tablet Take 1-2 tablets by mouth every 4 (four) hours as needed for moderate pain. Maximum dose per 24 hours - 8 pills  . [DISCONTINUED] insulin detemir (LEVEMIR) 100 UNIT/ML injection Inject 24 Units into the skin at bedtime.   . [DISCONTINUED] morphine (MSIR) 15 MG tablet Take 15 mg by mouth 2 (two) times daily.   . [DISCONTINUED] phenazopyridine (PYRIDIUM) 200 MG tablet Take 1 tablet (200 mg total) by mouth 3 (three) times daily as needed for pain.  . [DISCONTINUED] pravastatin (PRAVACHOL) 80 MG tablet Take 80 mg by mouth every evening.  . [DISCONTINUED] pregabalin (LYRICA) 150 MG capsule Take 150 mg by mouth 2 (two) times daily.   No facility-administered  encounter medications on file as of 12/12/2014.    PAST MEDICAL HISTORY:   Past Medical History  Diagnosis Date  . Diabetes mellitus   . Hypertension     pt denies  . COPD (chronic obstructive pulmonary disease) ([place])   . Peripheral neuropathy ([facility]) BOTTOM RIGHT FOOT  . GERD (gastroesophageal reflux disease)   . H/O hiatal hernia   . History of peptic ulcer AGE 64  . Bladder tumor     states that it was cancer  . Arthritis   . Depression   . Short of breath on exertion   . Coronary artery disease CARDIOLOGIST- DR Irish Lack  . Chronic knee pain RIGHT KNEE --  S/P KNEE REPLACEMENT JAN 2012--  PT STATES  NEEDS ANOTHER REPLACEMENT DUE TO JOINT RECALL  . History of bladder cancer TCC OF BLADDER  --  FOLLOWED BY DR Karsten Ro  . Complication of anesthesia EMERGENCE DELIRIUM  . Mild obstructive sleep apnea     per study 09-26-2007--  no cpap rx  . Nocturia   . Urgency of urination     PAST SURGICAL HISTORY:   Past Surgical History  Procedure Laterality Date  . Repair recurrent left rotator cuff tear  01-02-11  . Transurethral resection of bladder tumor  07-06-10  . Total knee arthroplasty  04-03-10    RIGHT  . Carpal tunnel release  04-11-2006    RIGHT  . Sigmoid colectomy  09-29-2001    DIVERTICULITIS  . Back surgery  X2  . Left eye surg.   2008    REMOVAL OF BB  . Bilateral shoulder surg.  2003  . Bilateral knee arthroscopy    . Left carpal. tunnel release    . Cardiac catheterization  04-13-2007   ---- DR Irish Lack    NO SIGNIFICANT CAD/ NORMAL LVF  . Transurethral resection of bladder tumor  05/20/2011    Procedure: TRANSURETHRAL RESECTION OF BLADDER TUMOR (TURBT);  Surgeon: Claybon Jabs, MD;  Location: [facility];  Service: Urology;  Laterality: N/A;  GYRUS INSTILL MYTOMYCIN C NO BED  . Transurethral resection of bladder tumor  01/31/2012    Procedure: TRANSURETHRAL RESECTION OF BLADDER TUMOR (TURBT);  Surgeon: Claybon Jabs, MD;  Location: [facility];  Service: Urology;  Laterality: N/A;  . Transurethral resection of bladder tumor with gyrus (turbt-gyrus) N/A 11/22/2013    Procedure: Bladder biopsy with fulgeration;  Surgeon: Claybon Jabs, MD;  Location: [facility];  Service: Urology;  Laterality: N/A;    SOCIAL HISTORY:   Social History   Social History  . Marital Status: Married    Spouse Name: N/A  . Number of Children: N/A  . Years of Education: N/A   Occupational History  . retired  carpet business   Social History Main Topics  . Smoking status: Current Some Day Smoker -- 1.50 packs/day for 50 years    Types: Cigarettes  . Smokeless tobacco: Never Used  . Alcohol Use: No  . Drug Use: No  . Sexual Activity: Not on file   Other Topics Concern  . Not on file   Social History Narrative    FAMILY HISTORY:   Family Status  Relation Status Death Age  . Mother Deceased     heart attack, arthritis  . Father Deceased     heart disease, valvular heart disease  . Sister Alive     stroke  . Sister Alive   . Child Alive     4, alive and well    ROS:  A complete 10 system review of systems was obtained and was unremarkable apart from what is mentioned above.  PHYSICAL EXAMINATION:    VITALS:   Filed Vitals:   12/12/14 1232  BP: 112/70  Pulse: 92  Height: 5\' 10"  (1.778 m)  Weight: 237 lb (107.502 kg)    GEN:  Normal appears male in no acute distress.  Appears stated age. HEENT:  Normocephalic, atraumatic. The mucous membranes are moist. The superficial temporal arteries are without ropiness or tenderness. Cardiovascular: Regular rate and rhythm. Lungs: Clear to auscultation bilaterally. Neck/Heme: There are no carotid bruits noted bilaterally.  NEUROLOGICAL: Orientation:  The patient is alert and oriented x 3.  Fund of knowledge is appropriate.  Recent and remote memory intact.  Attention span and concentration normal.  Repeats and names without difficulty. Cranial nerves:  There is good facial symmetry. The pupils are equal round and reactive to light bilaterally. Fundoscopic exam reveals clear disc margins bilaterally. Extraocular muscles are intact and visual fields are full to confrontational testing. Speech is fluent and clear. Soft palate rises symmetrically and there is no tongue deviation. Hearing is intact to conversational tone. Tone: Tone is good throughout. Sensation: Sensation is intact to light touch and pinprick throughout (facial, trunk, extremities). Vibration is intact at the bilateral big toe. There is no extinction with double simultaneous stimulation. There is no sensory dermatomal level identified. Coordination:  The patient has no difficulty with RAM's or FNF bilaterally. Motor: Strength is 5/5 in the bilateral upper and left lower extremities.  Strength is 5/5 in both the hip and knee flexors and extensors on the right, but ankle dorsiflexor strength on the right is 4+/5 .  Great toe dorsiflexion is only 4-/5.  Ankle inversion and eversion are 5/5.  Shoulder shrug is equal and symmetric. There is no pronator drift.  There are no fasciculations noted. DTR's: Deep tendon reflexes are 1/4 at the bilateral biceps, triceps, brachioradialis, and absent at the bilateral patella and achilles.  Plantar responses are downgoing bilaterally. Gait and Station: The patient is able to ambulate without difficulty.  He is wide-based.  He has difficulty ambulating in a tandem fashion.  He is unable to walk on his heels because of instability and inability to dorsiflex the right foot/ankle completely.  He is able to walk on his toes but he is not stable.     IMPRESSION/PLAN  1. Right foot drop  -Likely due to peroneal neuropathy at or near the fibular head.  Think that his DM predisposed him (or possibly even caused it, given that he reports that his A1c went from 6.3 to 10.4 in the last few months) and that his mowing of the lawns where he leans his  right leg on the  lawnmower lever (only with the right leg) for up to 16 hours at a time increased suscepitiblity as well.  I talked to him about EMG but he refused.  Told him it could be coming from the back given history of multiple back surgeries, but given the fact that it is getting better, he just does not wish to pursue anything any further.  Doesn't want therapy. Talked to him about importance of diet and talked about what a proper diabetic diet looks like. 2.  Diabetic peripheral neuropathy  -We discussed safety in detail. 3.  I will follow-up with him as needed.  If he changes his mind and decided that he wants further workup, I told him he could give me a call at any point in time.  He expressed understanding.  Greater than 50% of the 45 minute visit spent in counseling, as above.

## 2014-12-13 DIAGNOSIS — Z8551 Personal history of malignant neoplasm of bladder: Secondary | ICD-10-CM | POA: Diagnosis not present

## 2015-01-02 DIAGNOSIS — C679 Malignant neoplasm of bladder, unspecified: Secondary | ICD-10-CM | POA: Diagnosis not present

## 2015-01-02 DIAGNOSIS — E7800 Pure hypercholesterolemia, unspecified: Secondary | ICD-10-CM | POA: Diagnosis not present

## 2015-01-02 DIAGNOSIS — Z794 Long term (current) use of insulin: Secondary | ICD-10-CM | POA: Diagnosis not present

## 2015-01-02 DIAGNOSIS — I10 Essential (primary) hypertension: Secondary | ICD-10-CM | POA: Diagnosis not present

## 2015-01-02 DIAGNOSIS — F322 Major depressive disorder, single episode, severe without psychotic features: Secondary | ICD-10-CM | POA: Diagnosis not present

## 2015-01-02 DIAGNOSIS — K219 Gastro-esophageal reflux disease without esophagitis: Secondary | ICD-10-CM | POA: Diagnosis not present

## 2015-01-02 DIAGNOSIS — F17200 Nicotine dependence, unspecified, uncomplicated: Secondary | ICD-10-CM | POA: Diagnosis not present

## 2015-01-02 DIAGNOSIS — E1165 Type 2 diabetes mellitus with hyperglycemia: Secondary | ICD-10-CM | POA: Diagnosis not present

## 2015-01-02 DIAGNOSIS — J449 Chronic obstructive pulmonary disease, unspecified: Secondary | ICD-10-CM | POA: Diagnosis not present

## 2015-01-02 DIAGNOSIS — Z6835 Body mass index (BMI) 35.0-35.9, adult: Secondary | ICD-10-CM | POA: Diagnosis not present

## 2015-01-02 DIAGNOSIS — Z0000 Encounter for general adult medical examination without abnormal findings: Secondary | ICD-10-CM | POA: Diagnosis not present

## 2015-01-04 DIAGNOSIS — G894 Chronic pain syndrome: Secondary | ICD-10-CM | POA: Diagnosis not present

## 2015-01-04 DIAGNOSIS — M62830 Muscle spasm of back: Secondary | ICD-10-CM | POA: Diagnosis not present

## 2015-01-04 DIAGNOSIS — M47817 Spondylosis without myelopathy or radiculopathy, lumbosacral region: Secondary | ICD-10-CM | POA: Diagnosis not present

## 2015-01-04 DIAGNOSIS — M25561 Pain in right knee: Secondary | ICD-10-CM | POA: Diagnosis not present

## 2015-01-09 DIAGNOSIS — Z794 Long term (current) use of insulin: Secondary | ICD-10-CM | POA: Diagnosis not present

## 2015-01-09 DIAGNOSIS — E1142 Type 2 diabetes mellitus with diabetic polyneuropathy: Secondary | ICD-10-CM | POA: Diagnosis not present

## 2015-01-09 DIAGNOSIS — Z720 Tobacco use: Secondary | ICD-10-CM | POA: Diagnosis not present

## 2015-03-01 DIAGNOSIS — G894 Chronic pain syndrome: Secondary | ICD-10-CM | POA: Diagnosis not present

## 2015-03-01 DIAGNOSIS — M62830 Muscle spasm of back: Secondary | ICD-10-CM | POA: Diagnosis not present

## 2015-03-01 DIAGNOSIS — M25561 Pain in right knee: Secondary | ICD-10-CM | POA: Diagnosis not present

## 2015-03-01 DIAGNOSIS — M47817 Spondylosis without myelopathy or radiculopathy, lumbosacral region: Secondary | ICD-10-CM | POA: Diagnosis not present

## 2015-03-14 DIAGNOSIS — F17201 Nicotine dependence, unspecified, in remission: Secondary | ICD-10-CM | POA: Diagnosis not present

## 2015-03-14 DIAGNOSIS — E1142 Type 2 diabetes mellitus with diabetic polyneuropathy: Secondary | ICD-10-CM | POA: Diagnosis not present

## 2015-03-14 DIAGNOSIS — Z794 Long term (current) use of insulin: Secondary | ICD-10-CM | POA: Diagnosis not present

## 2015-03-21 DIAGNOSIS — Z0000 Encounter for general adult medical examination without abnormal findings: Secondary | ICD-10-CM | POA: Diagnosis not present

## 2015-03-21 DIAGNOSIS — Z8551 Personal history of malignant neoplasm of bladder: Secondary | ICD-10-CM | POA: Diagnosis not present

## 2015-03-21 DIAGNOSIS — N400 Enlarged prostate without lower urinary tract symptoms: Secondary | ICD-10-CM | POA: Diagnosis not present

## 2015-04-26 DIAGNOSIS — M25561 Pain in right knee: Secondary | ICD-10-CM | POA: Diagnosis not present

## 2015-04-26 DIAGNOSIS — M65321 Trigger finger, right index finger: Secondary | ICD-10-CM | POA: Diagnosis not present

## 2015-04-26 DIAGNOSIS — M62830 Muscle spasm of back: Secondary | ICD-10-CM | POA: Diagnosis not present

## 2015-04-26 DIAGNOSIS — M47817 Spondylosis without myelopathy or radiculopathy, lumbosacral region: Secondary | ICD-10-CM | POA: Diagnosis not present

## 2015-04-26 DIAGNOSIS — G894 Chronic pain syndrome: Secondary | ICD-10-CM | POA: Diagnosis not present

## 2015-04-26 DIAGNOSIS — Z79891 Long term (current) use of opiate analgesic: Secondary | ICD-10-CM | POA: Diagnosis not present

## 2015-04-27 ENCOUNTER — Encounter (HOSPITAL_COMMUNITY): Payer: Self-pay | Admitting: *Deleted

## 2015-04-27 ENCOUNTER — Other Ambulatory Visit: Payer: Self-pay | Admitting: Gastroenterology

## 2015-05-01 ENCOUNTER — Ambulatory Visit (HOSPITAL_COMMUNITY): Payer: Medicare Other | Admitting: Certified Registered"

## 2015-05-01 ENCOUNTER — Encounter (HOSPITAL_COMMUNITY): Payer: Self-pay | Admitting: *Deleted

## 2015-05-01 ENCOUNTER — Ambulatory Visit (HOSPITAL_COMMUNITY)
Admission: RE | Admit: 2015-05-01 | Discharge: 2015-05-01 | Disposition: A | Discharge status: Home / Self Care | Payer: Medicare Other | Source: Ambulatory Visit | Attending: Gastroenterology | Admitting: Gastroenterology

## 2015-05-01 ENCOUNTER — Encounter (HOSPITAL_COMMUNITY)
Admission: RE | Disposition: A | Discharge status: Home / Self Care | Payer: Self-pay | Source: Ambulatory Visit | Attending: Gastroenterology

## 2015-05-01 DIAGNOSIS — G4730 Sleep apnea, unspecified: Secondary | ICD-10-CM | POA: Insufficient documentation

## 2015-05-01 DIAGNOSIS — Z87891 Personal history of nicotine dependence: Secondary | ICD-10-CM | POA: Diagnosis not present

## 2015-05-01 DIAGNOSIS — Z9049 Acquired absence of other specified parts of digestive tract: Secondary | ICD-10-CM | POA: Insufficient documentation

## 2015-05-01 DIAGNOSIS — I2510 Atherosclerotic heart disease of native coronary artery without angina pectoris: Secondary | ICD-10-CM | POA: Insufficient documentation

## 2015-05-01 DIAGNOSIS — E119 Type 2 diabetes mellitus without complications: Secondary | ICD-10-CM | POA: Diagnosis not present

## 2015-05-01 DIAGNOSIS — K6389 Other specified diseases of intestine: Secondary | ICD-10-CM | POA: Insufficient documentation

## 2015-05-01 DIAGNOSIS — K219 Gastro-esophageal reflux disease without esophagitis: Secondary | ICD-10-CM | POA: Diagnosis not present

## 2015-05-01 DIAGNOSIS — D122 Benign neoplasm of ascending colon: Secondary | ICD-10-CM | POA: Diagnosis not present

## 2015-05-01 DIAGNOSIS — M1990 Unspecified osteoarthritis, unspecified site: Secondary | ICD-10-CM | POA: Insufficient documentation

## 2015-05-01 DIAGNOSIS — Z1211 Encounter for screening for malignant neoplasm of colon: Secondary | ICD-10-CM | POA: Insufficient documentation

## 2015-05-01 DIAGNOSIS — I10 Essential (primary) hypertension: Secondary | ICD-10-CM | POA: Insufficient documentation

## 2015-05-01 DIAGNOSIS — J449 Chronic obstructive pulmonary disease, unspecified: Secondary | ICD-10-CM | POA: Diagnosis not present

## 2015-05-01 DIAGNOSIS — Z86010 Personal history of colonic polyps: Secondary | ICD-10-CM | POA: Diagnosis not present

## 2015-05-01 DIAGNOSIS — K449 Diaphragmatic hernia without obstruction or gangrene: Secondary | ICD-10-CM | POA: Insufficient documentation

## 2015-05-01 HISTORY — PX: COLONOSCOPY WITH PROPOFOL: SHX5780

## 2015-05-01 LAB — GLUCOSE, CAPILLARY
Glucose-Capillary: 304 mg/dL — ABNORMAL HIGH (ref 65–99)
Glucose-Capillary: 263 mg/dL — ABNORMAL HIGH (ref 65–99)

## 2015-05-01 SURGERY — COLONOSCOPY WITH PROPOFOL
Anesthesia: Monitor Anesthesia Care

## 2015-05-01 MED ORDER — LACTATED RINGERS IV SOLN
INTRAVENOUS | Status: DC
Start: 2015-05-01 — End: 2015-05-01
  Administered 2015-05-01: 1000 mL via INTRAVENOUS

## 2015-05-01 MED ORDER — LIDOCAINE HCL (CARDIAC) 20 MG/ML IV SOLN
INTRAVENOUS | Status: DC | PRN
  Administered 2015-05-01: 80 mg via INTRAVENOUS

## 2015-05-01 MED ORDER — PROPOFOL 500 MG/50ML IV EMUL
INTRAVENOUS | Status: DC | PRN
  Administered 2015-05-01: 200 ug/kg/min via INTRAVENOUS

## 2015-05-01 MED ORDER — PHENYLEPHRINE HCL 10 MG/ML IJ SOLN
INTRAMUSCULAR | Status: DC | PRN
  Administered 2015-05-01 (×2): 80 ug via INTRAVENOUS

## 2015-05-01 MED ORDER — SODIUM CHLORIDE 0.9 % IV SOLN: INTRAVENOUS | Status: DC

## 2015-05-01 MED ORDER — LIDOCAINE HCL (CARDIAC) 20 MG/ML IV SOLN
INTRAVENOUS | Status: AC
  Filled 2015-05-01: qty 5

## 2015-05-01 MED ORDER — PHENYLEPHRINE 40 MCG/ML (10ML) SYRINGE FOR IV PUSH (FOR BLOOD PRESSURE SUPPORT)
PREFILLED_SYRINGE | INTRAVENOUS | Status: AC
  Filled 2015-05-01: qty 10

## 2015-05-01 MED ORDER — PROPOFOL 10 MG/ML IV BOLUS
INTRAVENOUS | Status: DC | PRN
  Administered 2015-05-01: 80 mg via INTRAVENOUS

## 2015-05-01 MED ORDER — PROPOFOL 10 MG/ML IV BOLUS
INTRAVENOUS | Status: AC
  Filled 2015-05-01: qty 40

## 2015-05-01 MED ORDER — PROPOFOL 10 MG/ML IV BOLUS
INTRAVENOUS | Status: AC
  Filled 2015-05-01: qty 20

## 2015-05-01 SURGICAL SUPPLY — 22 items

## 2015-05-01 NOTE — Anesthesia Postprocedure Evaluation (Signed)
Anesthesia Post Note  Patient: Michael Horn  Procedure(s) Performed: Procedure(s) (LRB): COLONOSCOPY WITH PROPOFOL (N/A)  Patient location during evaluation: PACU Anesthesia Type: MAC Level of consciousness: awake and alert Pain management: pain level controlled Vital Signs Assessment: post-procedure vital signs reviewed and stable Respiratory status: spontaneous breathing, nonlabored ventilation, respiratory function stable and patient connected to nasal cannula oxygen Cardiovascular status: stable and blood pressure returned to baseline Anesthetic complications: no    Last Vitals:  Filed Vitals:   05/01/15 1130 05/01/15 1133  BP:    Pulse: 67   Temp:  37 C  Resp: 28     Last Pain: There were no vitals filed for this visit.               Sarben

## 2015-05-01 NOTE — Op Note (Signed)
Procedure: Surveillance colonoscopy. 04/19/2004 baseline screening colonoscopy was performed with removal of a 2 mm tubular adenomatous rectal polyp. 05/04/2009 surveillance colonoscopy was performed with removal of a 3 mm mid transverse colon tubular adenomatous polyp. Remote sigmoid colon resection to treat diverticulitis complicated by abscess.  Endoscopist: Earle Gell  Premedication: Propofol administered by anesthesia  Procedure: The patient was placed in the left lateral decubitus position. Anal inspection and digital rectal exam were normal. The Pentax pediatric colonoscope was introduced into the rectum and advanced to the cecum. A normal-appearing appendiceal orifice and ileocecal valve were identified. Colonic preparation for the exam today was good. Withdrawal time was 15 minutes  Rectum. Normal. Retroflexed view of the distal rectum was normal  Sigmoid colon and descending colon. Normal post segmental sigmoid colon resection  Splenic flexure. Normal  Transverse colon. Normal  Hepatic flexure. Normal  Ascending colon. A 3 mm sessile polyp was removed with the cold biopsy forceps  Cecum and ileocecal valve. Normal  Assessment: A diminutive ascending colon polyp was removed. Otherwise normal colonoscopy post segmental sigmoid colon resection to treat diverticulitis complicated by abscess.  Recommendation: If the ascending colon polyp returns adenomatous pathologically, schedule surveillance colonoscopy in 5 years

## 2015-05-01 NOTE — Anesthesia Preprocedure Evaluation (Signed)
Anesthesia Evaluation  Patient identified by MRN, date of birth, ID band Patient awake    Reviewed: Allergy & Precautions, NPO status , Patient's Chart, lab work & pertinent test results  Airway Mallampati: II   Neck ROM: full    Dental   Pulmonary shortness of breath, sleep apnea , COPD, former smoker,    breath sounds clear to auscultation       Cardiovascular hypertension, + CAD   Rhythm:regular Rate:Normal     Neuro/Psych Depression  Neuromuscular disease    GI/Hepatic hiatal hernia, GERD  ,  Endo/Other  diabetes, Type 2  Renal/GU      Musculoskeletal  (+) Arthritis ,   Abdominal   Peds  Hematology   Anesthesia Other Findings   Reproductive/Obstetrics                             Anesthesia Physical Anesthesia Plan  ASA: III  Anesthesia Plan: MAC   Post-op Pain Management:    Induction: Intravenous  Airway Management Planned: Simple Face Mask  Additional Equipment:   Intra-op Plan:   Post-operative Plan:   Informed Consent: I have reviewed the patients History and Physical, chart, labs and discussed the procedure including the risks, benefits and alternatives for the proposed anesthesia with the patient or authorized representative who has indicated his/her understanding and acceptance.     Plan Discussed with: CRNA, Anesthesiologist and Surgeon  Anesthesia Plan Comments:         Anesthesia Quick Evaluation

## 2015-05-01 NOTE — Transfer of Care (Signed)
Immediate Anesthesia Transfer of Care Note  Patient: Michael Horn  Procedure(s) Performed: Procedure(s): COLONOSCOPY WITH PROPOFOL (N/A)  Patient Location: PACU  Anesthesia Type:MAC  Level of Consciousness:  sedated, patient cooperative and responds to stimulation  Airway & Oxygen Therapy:Patient Spontanous Breathing and Patient connected to face mask oxgen  Post-op Assessment:  Report given to PACU RN and Post -op Vital signs reviewed and stable  Post vital signs:  Reviewed and stable  Last Vitals:  Filed Vitals:   05/01/15 1130 05/01/15 1133  BP:    Pulse: 67   Temp:  37 C  Resp: 28     Complications: No apparent anesthesia complications

## 2015-05-01 NOTE — Discharge Instructions (Signed)
Colonoscopy, Care After °Refer to this sheet in the next few weeks. These instructions provide you with information on caring for yourself after your procedure. Your health care provider may also give you more specific instructions. Your treatment has been planned according to current medical practices, but problems sometimes occur. Call your health care provider if you have any problems or questions after your procedure. °WHAT TO EXPECT AFTER THE PROCEDURE  °After your procedure, it is typical to have the following: °· A small amount of blood in your stool. °· Moderate amounts of gas and mild abdominal cramping or bloating. °HOME CARE INSTRUCTIONS °· Do not drive, operate machinery, or sign important documents for 24 hours. °· You may shower and resume your regular physical activities, but move at a slower pace for the first 24 hours. °· Take frequent rest periods for the first 24 hours. °· Walk around or put a warm pack on your abdomen to help reduce abdominal cramping and bloating. °· Drink enough fluids to keep your urine clear or pale yellow. °· You may resume your normal diet as instructed by your health care provider. Avoid heavy or fried foods that are hard to digest. °· Avoid drinking alcohol for 24 hours or as instructed by your health care provider. °· Only take over-the-counter or prescription medicines as directed by your health care provider. °· If a tissue sample (biopsy) was taken during your procedure: °¨ Do not take aspirin or blood thinners for 7 days, or as instructed by your health care provider. °¨ Do not drink alcohol for 7 days, or as instructed by your health care provider. °¨ Eat soft foods for the first 24 hours. °SEEK MEDICAL CARE IF: °You have persistent spotting of blood in your stool 2-3 days after the procedure. °SEEK IMMEDIATE MEDICAL CARE IF: °· You have more than a small spotting of blood in your stool. °· You pass large blood clots in your stool. °· Your abdomen is swollen  (distended). °· You have nausea or vomiting. °· You have a fever. °· You have increasing abdominal pain that is not relieved with medicine. °  °This information is not intended to replace advice given to you by your health care provider. Make sure you discuss any questions you have with your health care provider. °  °Document Released: 10/10/2003 Document Revised: 12/16/2012 Document Reviewed: 11/02/2012 °Elsevier Interactive Patient Education ©2016 [employer] ° °

## 2015-05-01 NOTE — H&P (Signed)
  Procedure: Surveillance colonoscopy. Adenomatous colon polyps removed colonoscopically in the past  History: The patient is a 65 year old male born Mar 12, 1950. He is scheduled to undergo a surveillance colonoscopy today.  Past medical history: Type 2 diabetes mellitus. Chronic obstructive pulmonary disease. Hypertension. Hypercholesterolemia. Recurrent urinary bladder cancer. Osteoarthritis. Sigmoid colon resection due to diverticular abscess. Back surgery. Knee arthroscopy.  Exam: Patient is alert comfortably on the endoscopy stretcher. Abdomen is soft and nontender to palpation. Lungs are clear to auscultation. Cardiac exam reveals a regular rhythm.  Plan: Proceed with surveillance colonoscopy

## 2015-05-02 ENCOUNTER — Encounter (HOSPITAL_COMMUNITY): Payer: Self-pay | Admitting: Gastroenterology

## 2015-06-20 DIAGNOSIS — Z8551 Personal history of malignant neoplasm of bladder: Secondary | ICD-10-CM | POA: Diagnosis not present

## 2015-06-20 DIAGNOSIS — Z0000 Encounter for general adult medical examination without abnormal findings: Secondary | ICD-10-CM | POA: Diagnosis not present

## 2015-06-20 DIAGNOSIS — N3281 Overactive bladder: Secondary | ICD-10-CM | POA: Diagnosis not present

## 2015-06-21 DIAGNOSIS — G894 Chronic pain syndrome: Secondary | ICD-10-CM | POA: Diagnosis not present

## 2015-06-21 DIAGNOSIS — M62830 Muscle spasm of back: Secondary | ICD-10-CM | POA: Diagnosis not present

## 2015-06-21 DIAGNOSIS — M47817 Spondylosis without myelopathy or radiculopathy, lumbosacral region: Secondary | ICD-10-CM | POA: Diagnosis not present

## 2015-06-21 DIAGNOSIS — M25561 Pain in right knee: Secondary | ICD-10-CM | POA: Diagnosis not present

## 2015-07-06 DIAGNOSIS — K219 Gastro-esophageal reflux disease without esophagitis: Secondary | ICD-10-CM | POA: Diagnosis not present

## 2015-07-06 DIAGNOSIS — C679 Malignant neoplasm of bladder, unspecified: Secondary | ICD-10-CM | POA: Diagnosis not present

## 2015-07-06 DIAGNOSIS — E782 Mixed hyperlipidemia: Secondary | ICD-10-CM | POA: Diagnosis not present

## 2015-07-06 DIAGNOSIS — Z794 Long term (current) use of insulin: Secondary | ICD-10-CM | POA: Diagnosis not present

## 2015-07-06 DIAGNOSIS — G629 Polyneuropathy, unspecified: Secondary | ICD-10-CM | POA: Diagnosis not present

## 2015-07-06 DIAGNOSIS — E1165 Type 2 diabetes mellitus with hyperglycemia: Secondary | ICD-10-CM | POA: Diagnosis not present

## 2015-07-06 DIAGNOSIS — I10 Essential (primary) hypertension: Secondary | ICD-10-CM | POA: Diagnosis not present

## 2015-07-06 DIAGNOSIS — L309 Dermatitis, unspecified: Secondary | ICD-10-CM | POA: Diagnosis not present

## 2015-07-06 DIAGNOSIS — F322 Major depressive disorder, single episode, severe without psychotic features: Secondary | ICD-10-CM | POA: Diagnosis not present

## 2015-07-06 DIAGNOSIS — Z6835 Body mass index (BMI) 35.0-35.9, adult: Secondary | ICD-10-CM | POA: Diagnosis not present

## 2015-07-06 DIAGNOSIS — J449 Chronic obstructive pulmonary disease, unspecified: Secondary | ICD-10-CM | POA: Diagnosis not present

## 2015-07-21 DIAGNOSIS — H31002 Unspecified chorioretinal scars, left eye: Secondary | ICD-10-CM | POA: Diagnosis not present

## 2015-07-21 DIAGNOSIS — H5203 Hypermetropia, bilateral: Secondary | ICD-10-CM | POA: Diagnosis not present

## 2015-07-21 DIAGNOSIS — E1165 Type 2 diabetes mellitus with hyperglycemia: Secondary | ICD-10-CM | POA: Diagnosis not present

## 2015-07-28 DIAGNOSIS — F17201 Nicotine dependence, unspecified, in remission: Secondary | ICD-10-CM | POA: Diagnosis not present

## 2015-07-28 DIAGNOSIS — Z794 Long term (current) use of insulin: Secondary | ICD-10-CM | POA: Diagnosis not present

## 2015-07-28 DIAGNOSIS — E1142 Type 2 diabetes mellitus with diabetic polyneuropathy: Secondary | ICD-10-CM | POA: Diagnosis not present

## 2015-07-28 DIAGNOSIS — R945 Abnormal results of liver function studies: Secondary | ICD-10-CM | POA: Diagnosis not present

## 2015-08-14 DIAGNOSIS — E1165 Type 2 diabetes mellitus with hyperglycemia: Secondary | ICD-10-CM | POA: Diagnosis not present

## 2015-08-14 DIAGNOSIS — Z794 Long term (current) use of insulin: Secondary | ICD-10-CM | POA: Diagnosis not present

## 2015-08-16 DIAGNOSIS — M47817 Spondylosis without myelopathy or radiculopathy, lumbosacral region: Secondary | ICD-10-CM | POA: Diagnosis not present

## 2015-08-16 DIAGNOSIS — G894 Chronic pain syndrome: Secondary | ICD-10-CM | POA: Diagnosis not present

## 2015-08-16 DIAGNOSIS — M62830 Muscle spasm of back: Secondary | ICD-10-CM | POA: Diagnosis not present

## 2015-08-16 DIAGNOSIS — M25561 Pain in right knee: Secondary | ICD-10-CM | POA: Diagnosis not present

## 2015-09-05 DIAGNOSIS — E1142 Type 2 diabetes mellitus with diabetic polyneuropathy: Secondary | ICD-10-CM | POA: Diagnosis not present

## 2015-09-05 DIAGNOSIS — Z720 Tobacco use: Secondary | ICD-10-CM | POA: Diagnosis not present

## 2015-09-05 DIAGNOSIS — Z794 Long term (current) use of insulin: Secondary | ICD-10-CM | POA: Diagnosis not present

## 2015-09-21 DIAGNOSIS — Z8551 Personal history of malignant neoplasm of bladder: Secondary | ICD-10-CM | POA: Diagnosis not present

## 2015-10-12 DIAGNOSIS — M62830 Muscle spasm of back: Secondary | ICD-10-CM | POA: Diagnosis not present

## 2015-10-12 DIAGNOSIS — M25561 Pain in right knee: Secondary | ICD-10-CM | POA: Diagnosis not present

## 2015-10-12 DIAGNOSIS — G894 Chronic pain syndrome: Secondary | ICD-10-CM | POA: Diagnosis not present

## 2015-10-12 DIAGNOSIS — M47817 Spondylosis without myelopathy or radiculopathy, lumbosacral region: Secondary | ICD-10-CM | POA: Diagnosis not present

## 2015-10-12 DIAGNOSIS — Z79891 Long term (current) use of opiate analgesic: Secondary | ICD-10-CM | POA: Diagnosis not present

## 2015-10-25 DIAGNOSIS — S61032A Puncture wound without foreign body of left thumb without damage to nail, initial encounter: Secondary | ICD-10-CM | POA: Diagnosis not present

## 2015-10-30 DIAGNOSIS — M1712 Unilateral primary osteoarthritis, left knee: Secondary | ICD-10-CM | POA: Diagnosis not present

## 2015-11-09 DIAGNOSIS — Z23 Encounter for immunization: Secondary | ICD-10-CM | POA: Diagnosis not present

## 2015-11-09 DIAGNOSIS — Z794 Long term (current) use of insulin: Secondary | ICD-10-CM | POA: Diagnosis not present

## 2015-11-09 DIAGNOSIS — Z87891 Personal history of nicotine dependence: Secondary | ICD-10-CM | POA: Diagnosis not present

## 2015-11-09 DIAGNOSIS — Z6834 Body mass index (BMI) 34.0-34.9, adult: Secondary | ICD-10-CM | POA: Diagnosis not present

## 2015-11-09 DIAGNOSIS — E669 Obesity, unspecified: Secondary | ICD-10-CM | POA: Diagnosis not present

## 2015-11-09 DIAGNOSIS — E1142 Type 2 diabetes mellitus with diabetic polyneuropathy: Secondary | ICD-10-CM | POA: Diagnosis not present

## 2015-12-11 DIAGNOSIS — M47817 Spondylosis without myelopathy or radiculopathy, lumbosacral region: Secondary | ICD-10-CM | POA: Diagnosis not present

## 2015-12-11 DIAGNOSIS — G894 Chronic pain syndrome: Secondary | ICD-10-CM | POA: Diagnosis not present

## 2015-12-11 DIAGNOSIS — M62830 Muscle spasm of back: Secondary | ICD-10-CM | POA: Diagnosis not present

## 2015-12-11 DIAGNOSIS — M25561 Pain in right knee: Secondary | ICD-10-CM | POA: Diagnosis not present

## 2015-12-18 ENCOUNTER — Other Ambulatory Visit: Payer: Self-pay | Admitting: Internal Medicine

## 2015-12-18 DIAGNOSIS — M79671 Pain in right foot: Secondary | ICD-10-CM

## 2015-12-18 DIAGNOSIS — M79672 Pain in left foot: Secondary | ICD-10-CM

## 2015-12-18 DIAGNOSIS — E1142 Type 2 diabetes mellitus with diabetic polyneuropathy: Secondary | ICD-10-CM

## 2015-12-21 DIAGNOSIS — Z125 Encounter for screening for malignant neoplasm of prostate: Secondary | ICD-10-CM | POA: Diagnosis not present

## 2015-12-21 DIAGNOSIS — N3281 Overactive bladder: Secondary | ICD-10-CM | POA: Diagnosis not present

## 2015-12-21 DIAGNOSIS — Z8551 Personal history of malignant neoplasm of bladder: Secondary | ICD-10-CM | POA: Diagnosis not present

## 2015-12-26 ENCOUNTER — Ambulatory Visit
Admission: RE | Admit: 2015-12-26 | Discharge: 2015-12-26 | Disposition: A | Discharge status: Home / Self Care | Payer: Medicare Other | Source: Ambulatory Visit | Attending: Internal Medicine | Admitting: Internal Medicine

## 2015-12-26 DIAGNOSIS — M79672 Pain in left foot: Secondary | ICD-10-CM

## 2015-12-26 DIAGNOSIS — M79671 Pain in right foot: Secondary | ICD-10-CM | POA: Diagnosis not present

## 2015-12-26 DIAGNOSIS — E1142 Type 2 diabetes mellitus with diabetic polyneuropathy: Secondary | ICD-10-CM

## 2016-01-08 ENCOUNTER — Ambulatory Visit (INDEPENDENT_AMBULATORY_CARE_PROVIDER_SITE_OTHER): Payer: Medicare Other | Admitting: Podiatry

## 2016-01-08 ENCOUNTER — Encounter: Payer: Self-pay | Admitting: Podiatry

## 2016-01-08 VITALS — Ht 70.0 in | Wt 237.0 lb

## 2016-01-08 DIAGNOSIS — E1149 Type 2 diabetes mellitus with other diabetic neurological complication: Secondary | ICD-10-CM

## 2016-01-08 DIAGNOSIS — M216X9 Other acquired deformities of unspecified foot: Secondary | ICD-10-CM

## 2016-01-08 DIAGNOSIS — L84 Corns and callosities: Secondary | ICD-10-CM

## 2016-01-08 DIAGNOSIS — E11628 Type 2 diabetes mellitus with other skin complications: Secondary | ICD-10-CM | POA: Diagnosis not present

## 2016-01-08 NOTE — Progress Notes (Signed)
This patient presents the office for an evaluation of his diabetic feet. He says that in the evening. He has problems with pins and needles and cold feet  in both of his feet. He says that he has been treated with both Lyrica and gabapentin and the problem continues. His medical doctor referred him to this office for an evaluation of his diabetic feet. The patient states that he is interested in obtaining diabetic footgear. This patient is diabetic on both insulin and taking metformin. He presents the office today for an evaluation and treatment of this condition   GENERAL APPEARANCE: Alert, conversant. Appropriately groomed. No acute distress.  VASCULAR: Pedal pulses are  palpable at  [facility] and PT bilateral.  Capillary refill time is immediate to all digits,  Normal temperature gradient.  Digital hair growth is present bilateral  NEUROLOGIC: sensation is normal to 5.07 monofilament at 5/5 sites bilateral.  Light touch is intact bilateral, Muscle strength normal.  MUSCULOSKELETAL: acceptable muscle strength, tone and stability bilateral.  Intrinsic muscluature intact bilateral.  Rectus appearance of foot and digits noted bilateral.  High arched feet with prominent first metatarsal both feet. Contracted hallux  B/L at 1st MPJ  B/L  DERMATOLOGIC: skin color, texture, and turgor are within normal limits.  No preulcerative lesions or ulcers  are seen, no interdigital maceration noted.  No open lesions present.  Digital nails are asymptomatic. No drainage noted. Pre ulcerous lesion sub 1  B/L.   Diabetic neuropathy   Prominent first metatarsal  B/L with contracted 1st MPJ   Callus sub 1  B/L   ROV  examination of his feet reveal good circulation, normal L 0 PDS and no skin lesions noted except for callus under the ball of both feet. We discussed his feet and decided he would be a good candidate for diabetic shoes. Patient will be ordered diabetic shoes for his diabetic neuropathy with a pre-ulcerous calluses  bilaterally as well as prominent metatarsal bilateral.  Patient to be called when the shoes are approved   Gardiner Barefoot DPM

## 2016-01-12 DIAGNOSIS — E1142 Type 2 diabetes mellitus with diabetic polyneuropathy: Secondary | ICD-10-CM | POA: Diagnosis not present

## 2016-01-12 DIAGNOSIS — E1165 Type 2 diabetes mellitus with hyperglycemia: Secondary | ICD-10-CM | POA: Diagnosis not present

## 2016-01-12 DIAGNOSIS — M159 Polyosteoarthritis, unspecified: Secondary | ICD-10-CM | POA: Diagnosis not present

## 2016-01-12 DIAGNOSIS — J449 Chronic obstructive pulmonary disease, unspecified: Secondary | ICD-10-CM | POA: Diagnosis not present

## 2016-01-12 DIAGNOSIS — F322 Major depressive disorder, single episode, severe without psychotic features: Secondary | ICD-10-CM | POA: Diagnosis not present

## 2016-01-12 DIAGNOSIS — Z23 Encounter for immunization: Secondary | ICD-10-CM | POA: Diagnosis not present

## 2016-01-12 DIAGNOSIS — C679 Malignant neoplasm of bladder, unspecified: Secondary | ICD-10-CM | POA: Diagnosis not present

## 2016-01-12 DIAGNOSIS — I10 Essential (primary) hypertension: Secondary | ICD-10-CM | POA: Diagnosis not present

## 2016-01-12 DIAGNOSIS — E782 Mixed hyperlipidemia: Secondary | ICD-10-CM | POA: Diagnosis not present

## 2016-01-12 DIAGNOSIS — Z0000 Encounter for general adult medical examination without abnormal findings: Secondary | ICD-10-CM | POA: Diagnosis not present

## 2016-01-12 DIAGNOSIS — K219 Gastro-esophageal reflux disease without esophagitis: Secondary | ICD-10-CM | POA: Diagnosis not present

## 2016-01-12 DIAGNOSIS — G629 Polyneuropathy, unspecified: Secondary | ICD-10-CM | POA: Diagnosis not present

## 2016-01-30 DIAGNOSIS — M1712 Unilateral primary osteoarthritis, left knee: Secondary | ICD-10-CM | POA: Diagnosis not present

## 2016-02-05 DIAGNOSIS — G894 Chronic pain syndrome: Secondary | ICD-10-CM | POA: Diagnosis not present

## 2016-02-05 DIAGNOSIS — M62830 Muscle spasm of back: Secondary | ICD-10-CM | POA: Diagnosis not present

## 2016-02-05 DIAGNOSIS — M47817 Spondylosis without myelopathy or radiculopathy, lumbosacral region: Secondary | ICD-10-CM | POA: Diagnosis not present

## 2016-02-05 DIAGNOSIS — M25561 Pain in right knee: Secondary | ICD-10-CM | POA: Diagnosis not present

## 2016-02-12 DIAGNOSIS — E1142 Type 2 diabetes mellitus with diabetic polyneuropathy: Secondary | ICD-10-CM | POA: Diagnosis not present

## 2016-02-12 DIAGNOSIS — Z794 Long term (current) use of insulin: Secondary | ICD-10-CM | POA: Diagnosis not present

## 2016-02-12 DIAGNOSIS — E669 Obesity, unspecified: Secondary | ICD-10-CM | POA: Diagnosis not present

## 2016-02-12 DIAGNOSIS — Z6836 Body mass index (BMI) 36.0-36.9, adult: Secondary | ICD-10-CM | POA: Diagnosis not present

## 2016-02-12 DIAGNOSIS — Z720 Tobacco use: Secondary | ICD-10-CM | POA: Diagnosis not present

## 2016-03-13 ENCOUNTER — Encounter (HOSPITAL_COMMUNITY): Payer: Self-pay

## 2016-03-13 ENCOUNTER — Emergency Department (HOSPITAL_COMMUNITY)
Admission: EM | Admit: 2016-03-13 | Discharge: 2016-03-13 | Disposition: A | Discharge status: Home / Self Care | Payer: Medicare Other | Attending: Emergency Medicine | Admitting: Emergency Medicine

## 2016-03-13 DIAGNOSIS — Z794 Long term (current) use of insulin: Secondary | ICD-10-CM | POA: Diagnosis not present

## 2016-03-13 DIAGNOSIS — Z96651 Presence of right artificial knee joint: Secondary | ICD-10-CM | POA: Diagnosis not present

## 2016-03-13 DIAGNOSIS — E860 Dehydration: Secondary | ICD-10-CM | POA: Diagnosis not present

## 2016-03-13 DIAGNOSIS — E1140 Type 2 diabetes mellitus with diabetic neuropathy, unspecified: Secondary | ICD-10-CM | POA: Diagnosis not present

## 2016-03-13 DIAGNOSIS — Z7982 Long term (current) use of aspirin: Secondary | ICD-10-CM | POA: Insufficient documentation

## 2016-03-13 DIAGNOSIS — J449 Chronic obstructive pulmonary disease, unspecified: Secondary | ICD-10-CM | POA: Diagnosis not present

## 2016-03-13 DIAGNOSIS — E1159 Type 2 diabetes mellitus with other circulatory complications: Secondary | ICD-10-CM | POA: Diagnosis not present

## 2016-03-13 DIAGNOSIS — I959 Hypotension, unspecified: Secondary | ICD-10-CM | POA: Diagnosis not present

## 2016-03-13 DIAGNOSIS — I10 Essential (primary) hypertension: Secondary | ICD-10-CM | POA: Insufficient documentation

## 2016-03-13 DIAGNOSIS — R17 Unspecified jaundice: Secondary | ICD-10-CM | POA: Diagnosis not present

## 2016-03-13 DIAGNOSIS — Z8551 Personal history of malignant neoplasm of bladder: Secondary | ICD-10-CM | POA: Diagnosis not present

## 2016-03-13 DIAGNOSIS — I2510 Atherosclerotic heart disease of native coronary artery without angina pectoris: Secondary | ICD-10-CM | POA: Insufficient documentation

## 2016-03-13 DIAGNOSIS — R109 Unspecified abdominal pain: Secondary | ICD-10-CM | POA: Diagnosis not present

## 2016-03-13 DIAGNOSIS — R112 Nausea with vomiting, unspecified: Secondary | ICD-10-CM | POA: Insufficient documentation

## 2016-03-13 DIAGNOSIS — R197 Diarrhea, unspecified: Secondary | ICD-10-CM | POA: Diagnosis not present

## 2016-03-13 DIAGNOSIS — Z87891 Personal history of nicotine dependence: Secondary | ICD-10-CM | POA: Insufficient documentation

## 2016-03-13 LAB — CBC WITH DIFFERENTIAL/PLATELET
BASOS PCT: 0 %
Basophils Absolute: 0 10*3/uL (ref 0.0–0.1)
Eosinophils Absolute: 0.2 10*3/uL (ref 0.0–0.7)
Eosinophils Relative: 2 %
HEMATOCRIT: 40.6 % (ref 39.0–52.0)
Hemoglobin: 13.2 g/dL (ref 13.0–17.0)
LYMPHS PCT: 12 %
Lymphs Abs: 1.2 10*3/uL (ref 0.7–4.0)
MCH: 22.8 pg — ABNORMAL LOW (ref 26.0–34.0)
MCHC: 32.5 g/dL (ref 30.0–36.0)
MCV: 70.2 fL — AB (ref 78.0–100.0)
Monocytes Absolute: 0.8 10*3/uL (ref 0.1–1.0)
Monocytes Relative: 8 %
NEUTROS ABS: 7.4 10*3/uL (ref 1.7–7.7)
NEUTROS PCT: 78 %
PLATELETS: 282 10*3/uL (ref 150–400)
RBC: 5.78 MIL/uL (ref 4.22–5.81)
RDW: 16.4 % — ABNORMAL HIGH (ref 11.5–15.5)
WBC: 9.6 10*3/uL (ref 4.0–10.5)

## 2016-03-13 LAB — COMPREHENSIVE METABOLIC PANEL
ALBUMIN: 4.3 g/dL (ref 3.5–5.0)
ALT: 22 U/L (ref 17–63)
AST: 27 U/L (ref 15–41)
Alkaline Phosphatase: 45 U/L (ref 38–126)
Anion gap: 11 (ref 5–15)
BILIRUBIN TOTAL: 0.8 mg/dL (ref 0.3–1.2)
BUN: 39 mg/dL — AB (ref 6–20)
CHLORIDE: 96 mmol/L — AB (ref 101–111)
CO2: 25 mmol/L (ref 22–32)
CREATININE: 1.62 mg/dL — AB (ref 0.61–1.24)
Calcium: 8.7 mg/dL — ABNORMAL LOW (ref 8.9–10.3)
GFR calc Af Amer: 49 mL/min — ABNORMAL LOW (ref >60)
GFR, EST NON AFRICAN AMERICAN: 42 mL/min — AB (ref >60)
GLUCOSE: 102 mg/dL — AB (ref 65–99)
Potassium: 3.6 mmol/L (ref 3.5–5.1)
Sodium: 132 mmol/L — ABNORMAL LOW (ref 135–145)
Total Protein: 6.7 g/dL (ref 6.5–8.1)

## 2016-03-13 LAB — I-STAT CG4 LACTIC ACID, ED: LACTIC ACID, VENOUS: 1.31 mmol/L (ref 0.5–1.9)

## 2016-03-13 MED ORDER — ONDANSETRON HCL 4 MG/2ML IJ SOLN
4.0000 mg | Freq: Once | INTRAMUSCULAR | Status: AC
  Administered 2016-03-13: 4 mg via INTRAVENOUS
  Filled 2016-03-13: qty 2

## 2016-03-13 MED ORDER — SODIUM CHLORIDE 0.9 % IV BOLUS (SEPSIS)
1000.0000 mL | Freq: Once | INTRAVENOUS | Status: AC
  Administered 2016-03-13: 1000 mL via INTRAVENOUS

## 2016-03-13 MED ORDER — KETOROLAC TROMETHAMINE 30 MG/ML IJ SOLN
15.0000 mg | Freq: Once | INTRAMUSCULAR | Status: AC
  Administered 2016-03-13: 15 mg via INTRAVENOUS
  Filled 2016-03-13: qty 1

## 2016-03-13 MED ORDER — ACETAMINOPHEN 500 MG PO TABS: 1000.0000 mg | ORAL_TABLET | Freq: Once | ORAL | Status: DC

## 2016-03-13 NOTE — ED Provider Notes (Signed)
[facility] DEPT Provider Note   CSN: [id number] Arrival date & time: 03/13/16  [id number]     History   Chief Complaint Chief Complaint  Patient presents with  . Abdominal Pain  . Emesis  . Diarrhea    HPI Michael Horn is a 66 y.o. male.  The history is provided by the patient.  Emesis   This is a new problem. Episode onset: 4 days ago. Episode frequency: initially x2 and has not vomited in 3 days. The problem has been resolved. There has been no fever. Associated symptoms include chills, diarrhea, myalgias and sweats. Pertinent negatives include no abdominal pain.  Diarrhea   This is a new problem. Episode frequency: several times for the first few days has tapered off and has only gone in the last day. The problem has been rapidly improving. The stool consistency is described as watery (brown). Associated symptoms include vomiting, chills, sweats and myalgias. Pertinent negatives include no abdominal pain. He has tried nothing for the symptoms. His past medical history does not include recent abdominal surgery (remote sigmoidectomy).    Past Medical History:  Diagnosis Date  . Arthritis    Arthritis -DDD."chronic pain med used"  . Bladder tumor    states that it was cancer  . Cancer (HCC)    Bladder cancer"-Dr.Ottelin- checks every 3 months--no signs last checks.  . Chronic knee pain RIGHT KNEE --  S/P KNEE REPLACEMENT JAN 2012--  PT STATES  NEEDS ANOTHER REPLACEMENT DUE TO JOINT RECALL  . Complication of anesthesia EMERGENCE DELIRIUM   occurred x1- 2 yrs ago.  Marland Kitchen COPD (chronic obstructive pulmonary disease) ([place])   . Coronary artery disease CARDIOLOGIST- DR Irish Lack  . Depression   . Diabetes mellitus   . GERD (gastroesophageal reflux disease)   . H/O hiatal hernia   . History of bladder cancer TCC OF BLADDER  --  FOLLOWED BY DR Karsten Ro  . History of peptic ulcer AGE 1  . Hypertension    pt denies  . Mild obstructive sleep apnea    per study 09-26-2007--  no cpap  rx  . Nocturia   . Peripheral neuropathy (HCC) BOTTOM RIGHT FOOT  . Short of breath on exertion   . Urgency of urination     Patient Active Problem List   Diagnosis Date Noted  . Obesity, unspecified 07/08/2013  . Mixed hyperlipidemia 07/08/2013  . Chest pain, unspecified 07/08/2013  . Tobacco use disorder 07/08/2013  . 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 BACK SURGERY  X2  . BILATERAL KNEE ARTHROSCOPY    . BILATERAL SHOULDER SURG.  2003  . CARDIAC CATHETERIZATION  04-13-2007   ---- DR Irish Lack   NO SIGNIFICANT CAD/ NORMAL LVF  . CARPAL TUNNEL RELEASE  04-11-2006   RIGHT  . COLONOSCOPY WITH PROPOFOL N/A 05/01/2015   Procedure: COLONOSCOPY WITH PROPOFOL;  Surgeon: Garlan Fair, MD;  Location: [facility];  Service: Endoscopy;  Laterality: N/A;  . LEFT CARPAL. TUNNEL RELEASE    . LEFT EYE SURG.   2008   REMOVAL OF BB  . REPAIR RECURRENT LEFT ROTATOR CUFF TEAR  01-02-11  . SIGMOID COLECTOMY  09-29-2001   DIVERTICULITIS  . TOTAL KNEE ARTHROPLASTY  04-03-10   RIGHT  . TRANSURETHRAL RESECTION OF BLADDER TUMOR  07-06-10  . TRANSURETHRAL RESECTION OF BLADDER TUMOR  05/20/2011   Procedure: TRANSURETHRAL RESECTION OF BLADDER TUMOR (TURBT);  Surgeon: Claybon Jabs, MD;  Location: [place];  Service: Urology;  Laterality: N/A;  GYRUS INSTILL MYTOMYCIN C NO BED  . TRANSURETHRAL RESECTION OF BLADDER TUMOR  01/31/2012   Procedure: TRANSURETHRAL RESECTION OF BLADDER TUMOR (TURBT);  Surgeon: Claybon Jabs, MD;  Location: [facility];  Service: Urology;  Laterality: N/A;  . TRANSURETHRAL RESECTION OF BLADDER TUMOR WITH GYRUS (TURBT-GYRUS) N/A 11/22/2013   Procedure: Bladder biopsy with fulgeration;  Surgeon: Claybon Jabs, MD;  Location: [facility];  Service: Urology;  Laterality: N/A;       Home Medications    Prior to Admission medications   Medication  Sig Start Date End Date Taking? Authorizing Provider  aspirin EC 81 MG tablet Take 81 mg by mouth every other day.    Historical Provider, MD  citalopram (CELEXA) 20 MG tablet Take 20 mg by mouth at bedtime.     Historical Provider, MD  cyclobenzaprine (FLEXERIL) 10 MG tablet Take 5 mg by mouth 3 (three) times daily as needed for muscle spasms.     Historical Provider, MD  docusate sodium (COLACE) 100 MG capsule Take 100 mg by mouth daily.    Historical Provider, MD  esomeprazole (NEXIUM) 40 MG capsule Take 40 mg by mouth every morning.     Historical Provider, MD  fenofibrate 160 MG tablet Take 1 tablet (160 mg total) by mouth daily. 05/05/13   Jettie Booze, MD  fluticasone (FLOVENT HFA) 44 MCG/ACT inhaler Inhale 1-2 puffs into the lungs daily as needed (shortness of breathe.).     Historical Provider, MD  insulin aspart protamine- aspart (NOVOLOG MIX 70/30) (70-30) 100 UNIT/ML injection Inject 30 Units into the skin 2 (two) times daily.     Historical Provider, MD  lisinopril (PRINIVIL,ZESTRIL) 10 MG tablet Take 10 mg by mouth every morning.    Historical Provider, MD  metFORMIN (GLUCOPHAGE) 1000 MG tablet Take 1,000 mg by mouth 2 (two) times daily.    Historical Provider, MD  Oxycodone HCl 10 MG TABS Take 10 mg by mouth every 6 (six) hours as needed (pain).     Historical Provider, MD  pravastatin (PRAVACHOL) 40 MG tablet Take 80 mg by mouth at bedtime.     Historical Provider, MD  pregabalin (LYRICA) 150 MG capsule Take 150 mg by mouth 2 (two) times daily.    Historical Provider, MD  traZODone (DESYREL) 50 MG tablet Take 50 mg by mouth at bedtime.    Historical Provider, MD    Family History Family History  Problem Relation Age of Onset  . Heart attack Mother     Social History Social History  Substance Use Topics  . Smoking status: Former Smoker    Packs/day: 1.50    Years: 50.00    Types: Cigarettes    Quit date: 01/24/2014  . Smokeless tobacco: Never Used  . Alcohol use  No     Allergies   Contrast media [iodinated diagnostic agents]   Review of Systems Review of Systems  Constitutional: Positive for chills.  Gastrointestinal: Positive for diarrhea and vomiting. Negative for abdominal pain.  Musculoskeletal: Positive for myalgias.  All other systems reviewed and are negative.    Physical Exam Updated Vital Signs BP 122/75 (BP Location: Left Arm)   Pulse 98   Temp 98.3 F (36.8 C) (Oral)   Resp 18   Ht 5\' 10"  (1.778 m)   Wt 245 lb (111.1 kg)   SpO2 97%   BMI 35.15 kg/m   Physical Exam  Constitutional: He is oriented to person, place, and time. He appears well-developed and well-nourished. No distress.  HENT:  Head: Normocephalic and atraumatic.  Nose: Nose normal.  Eyes: Conjunctivae are normal.  Neck: Neck supple. No tracheal deviation present.  Cardiovascular: Normal rate, regular rhythm and normal heart sounds.   Pulmonary/Chest: Effort normal and breath sounds normal. No respiratory distress.  Abdominal: Soft. He exhibits no distension. There is no tenderness. There is no rebound and no guarding.  Neurological: He is alert and oriented to person, place, and time.  Skin: Skin is warm and dry. Capillary refill takes less than 2 seconds.  Psychiatric: He has a normal mood and affect. His behavior is normal.  Vitals reviewed.    ED Treatments / Results  Labs (all labs ordered are listed, but only abnormal results are displayed) Labs Reviewed  CBC WITH DIFFERENTIAL/PLATELET - Abnormal; Notable for the following:       Result Value   MCV 70.2 (*)    MCH 22.8 (*)    RDW 16.4 (*)    All other components within normal limits  COMPREHENSIVE METABOLIC PANEL - Abnormal; Notable for the following:    Sodium 132 (*)    Chloride 96 (*)    Glucose, Bld 102 (*)    BUN 39 (*)    Creatinine, Ser 1.62 (*)    Calcium 8.7 (*)    GFR calc non Af Amer 42 (*)    GFR calc Af Amer 49 (*)    All other components within normal limits  I-STAT  CG4 LACTIC ACID, ED    EKG  EKG Interpretation None       Radiology No results found.  Procedures Procedures (including critical care time)  Medications Ordered in ED Medications  sodium chloride 0.9 % bolus 1,000 mL (0 mLs Intravenous Stopped 03/13/16 1759)  ondansetron (ZOFRAN) injection 4 mg (4 mg Intravenous Given 03/13/16 1648)  ketorolac (TORADOL) 30 MG/ML injection 15 mg (15 mg Intravenous Given 03/13/16 1653)  sodium chloride 0.9 % bolus 1,000 mL (0 mLs Intravenous Stopped 03/13/16 1958)     Initial Impression / Assessment and Plan / ED Course  I have reviewed the triage vital signs and the nursing notes.  Pertinent labs & imaging results that were available during my care of the patient were reviewed by me and considered in my medical decision making (see chart for details).  Clinical Course     66 y.o. male presents with n/v/d which started 4 days ago and has steadily decreased. His PCP was concerned for jaundiced appearance and dehydration. CMP without hepatic dysfunction, appears to be improving clinically. Cr elevated from baseline without evidence of renal failure, IVF resuscitated with 2L saline and feels better. Recommended bismuth salts and increased po intake of fluids, has not vomited today and ate/drank in the ED. Plan to follow up with PCP for metabolic panel recheck and return precautions discussed for worsening or new concerning symptoms.   Final Clinical Impressions(s) / ED Diagnoses   Final diagnoses:  Moderate dehydration  Nausea vomiting and diarrhea    New Prescriptions New Prescriptions   No medications on file     Leo Grosser, MD 03/14/16 [phone]

## 2016-03-13 NOTE — ED Triage Notes (Addendum)
Pt c/o intermittent lower abdominal pain, n/v/d, and mid back pain x 4 days.  Pain score 10/10.  Pt has not taken anything for symptoms.  Pt reports 1 episode of bright red blood w/ BM x 4 days ago.  Sts this has resolved.  Pt's wife reports "he looks a little yellow."  Pt was sent by PCP.  Hgb WDL.  No tests were performed to check liver function.

## 2016-03-14 ENCOUNTER — Other Ambulatory Visit: Payer: Self-pay | Admitting: Family Medicine

## 2016-03-14 DIAGNOSIS — R1011 Right upper quadrant pain: Secondary | ICD-10-CM

## 2016-03-19 ENCOUNTER — Ambulatory Visit
Admission: RE | Admit: 2016-03-19 | Discharge: 2016-03-19 | Disposition: A | Discharge status: Home / Self Care | Payer: Medicare Other | Source: Ambulatory Visit | Attending: Family Medicine | Admitting: Family Medicine

## 2016-03-19 DIAGNOSIS — N179 Acute kidney failure, unspecified: Secondary | ICD-10-CM | POA: Diagnosis not present

## 2016-03-19 DIAGNOSIS — J449 Chronic obstructive pulmonary disease, unspecified: Secondary | ICD-10-CM | POA: Diagnosis not present

## 2016-03-19 DIAGNOSIS — R1011 Right upper quadrant pain: Secondary | ICD-10-CM | POA: Diagnosis not present

## 2016-03-19 DIAGNOSIS — E1142 Type 2 diabetes mellitus with diabetic polyneuropathy: Secondary | ICD-10-CM | POA: Diagnosis not present

## 2016-03-19 DIAGNOSIS — E782 Mixed hyperlipidemia: Secondary | ICD-10-CM | POA: Diagnosis not present

## 2016-03-19 DIAGNOSIS — I10 Essential (primary) hypertension: Secondary | ICD-10-CM | POA: Diagnosis not present

## 2016-03-19 DIAGNOSIS — F322 Major depressive disorder, single episode, severe without psychotic features: Secondary | ICD-10-CM | POA: Diagnosis not present

## 2016-03-19 DIAGNOSIS — Z794 Long term (current) use of insulin: Secondary | ICD-10-CM | POA: Diagnosis not present

## 2016-03-19 DIAGNOSIS — E1159 Type 2 diabetes mellitus with other circulatory complications: Secondary | ICD-10-CM | POA: Diagnosis not present

## 2016-03-19 DIAGNOSIS — E1165 Type 2 diabetes mellitus with hyperglycemia: Secondary | ICD-10-CM | POA: Diagnosis not present

## 2016-03-19 DIAGNOSIS — M159 Polyosteoarthritis, unspecified: Secondary | ICD-10-CM | POA: Diagnosis not present

## 2016-04-01 DIAGNOSIS — G894 Chronic pain syndrome: Secondary | ICD-10-CM | POA: Diagnosis not present

## 2016-04-01 DIAGNOSIS — N3281 Overactive bladder: Secondary | ICD-10-CM | POA: Diagnosis not present

## 2016-04-01 DIAGNOSIS — Z8551 Personal history of malignant neoplasm of bladder: Secondary | ICD-10-CM | POA: Diagnosis not present

## 2016-04-01 DIAGNOSIS — M62830 Muscle spasm of back: Secondary | ICD-10-CM | POA: Diagnosis not present

## 2016-04-01 DIAGNOSIS — M47817 Spondylosis without myelopathy or radiculopathy, lumbosacral region: Secondary | ICD-10-CM | POA: Diagnosis not present

## 2016-04-01 DIAGNOSIS — M25561 Pain in right knee: Secondary | ICD-10-CM | POA: Diagnosis not present

## 2016-04-03 ENCOUNTER — Telehealth: Payer: Self-pay | Admitting: *Deleted

## 2016-04-03 NOTE — Telephone Encounter (Signed)
Pt states he would like to know the status of his diabetic shoes and paperwork, his doctor sent it 3 months ago.

## 2016-04-12 NOTE — Telephone Encounter (Signed)
No paperwork was ever received paperwork will have to be completed by Dr Prudence Davidson

## 2016-05-27 DIAGNOSIS — G894 Chronic pain syndrome: Secondary | ICD-10-CM | POA: Diagnosis not present

## 2016-05-27 DIAGNOSIS — M62830 Muscle spasm of back: Secondary | ICD-10-CM | POA: Diagnosis not present

## 2016-05-27 DIAGNOSIS — M25561 Pain in right knee: Secondary | ICD-10-CM | POA: Diagnosis not present

## 2016-05-27 DIAGNOSIS — M47817 Spondylosis without myelopathy or radiculopathy, lumbosacral region: Secondary | ICD-10-CM | POA: Diagnosis not present

## 2016-06-24 ENCOUNTER — Ambulatory Visit
Admission: RE | Admit: 2016-06-24 | Discharge: 2016-06-24 | Disposition: A | Discharge status: Home / Self Care | Payer: Medicare Other | Source: Ambulatory Visit | Attending: Family Medicine | Admitting: Family Medicine

## 2016-06-24 ENCOUNTER — Other Ambulatory Visit: Payer: Self-pay | Admitting: Family Medicine

## 2016-06-24 DIAGNOSIS — R05 Cough: Secondary | ICD-10-CM | POA: Diagnosis not present

## 2016-06-24 DIAGNOSIS — F17200 Nicotine dependence, unspecified, uncomplicated: Secondary | ICD-10-CM | POA: Diagnosis not present

## 2016-06-24 DIAGNOSIS — R059 Cough, unspecified: Secondary | ICD-10-CM

## 2016-06-24 DIAGNOSIS — J441 Chronic obstructive pulmonary disease with (acute) exacerbation: Secondary | ICD-10-CM | POA: Diagnosis not present

## 2016-07-15 DIAGNOSIS — I700 Atherosclerosis of aorta: Secondary | ICD-10-CM | POA: Diagnosis not present

## 2016-07-15 DIAGNOSIS — C679 Malignant neoplasm of bladder, unspecified: Secondary | ICD-10-CM | POA: Diagnosis not present

## 2016-07-15 DIAGNOSIS — E1165 Type 2 diabetes mellitus with hyperglycemia: Secondary | ICD-10-CM | POA: Diagnosis not present

## 2016-07-15 DIAGNOSIS — I10 Essential (primary) hypertension: Secondary | ICD-10-CM | POA: Diagnosis not present

## 2016-07-15 DIAGNOSIS — F322 Major depressive disorder, single episode, severe without psychotic features: Secondary | ICD-10-CM | POA: Diagnosis not present

## 2016-07-15 DIAGNOSIS — E1142 Type 2 diabetes mellitus with diabetic polyneuropathy: Secondary | ICD-10-CM | POA: Diagnosis not present

## 2016-07-15 DIAGNOSIS — F17200 Nicotine dependence, unspecified, uncomplicated: Secondary | ICD-10-CM | POA: Diagnosis not present

## 2016-07-15 DIAGNOSIS — J449 Chronic obstructive pulmonary disease, unspecified: Secondary | ICD-10-CM | POA: Diagnosis not present

## 2016-07-15 DIAGNOSIS — E782 Mixed hyperlipidemia: Secondary | ICD-10-CM | POA: Diagnosis not present

## 2016-07-18 DIAGNOSIS — M1712 Unilateral primary osteoarthritis, left knee: Secondary | ICD-10-CM | POA: Diagnosis not present

## 2016-07-22 DIAGNOSIS — G894 Chronic pain syndrome: Secondary | ICD-10-CM | POA: Diagnosis not present

## 2016-07-22 DIAGNOSIS — M47817 Spondylosis without myelopathy or radiculopathy, lumbosacral region: Secondary | ICD-10-CM | POA: Diagnosis not present

## 2016-07-22 DIAGNOSIS — M62830 Muscle spasm of back: Secondary | ICD-10-CM | POA: Diagnosis not present

## 2016-07-22 DIAGNOSIS — M25561 Pain in right knee: Secondary | ICD-10-CM | POA: Diagnosis not present

## 2016-08-19 DIAGNOSIS — Z794 Long term (current) use of insulin: Secondary | ICD-10-CM | POA: Diagnosis not present

## 2016-08-19 DIAGNOSIS — E1142 Type 2 diabetes mellitus with diabetic polyneuropathy: Secondary | ICD-10-CM | POA: Diagnosis not present

## 2016-08-19 DIAGNOSIS — Z720 Tobacco use: Secondary | ICD-10-CM | POA: Diagnosis not present

## 2016-08-19 DIAGNOSIS — Z6836 Body mass index (BMI) 36.0-36.9, adult: Secondary | ICD-10-CM | POA: Diagnosis not present

## 2016-08-19 DIAGNOSIS — E669 Obesity, unspecified: Secondary | ICD-10-CM | POA: Diagnosis not present

## 2016-09-18 DIAGNOSIS — G894 Chronic pain syndrome: Secondary | ICD-10-CM | POA: Diagnosis not present

## 2016-09-18 DIAGNOSIS — M62830 Muscle spasm of back: Secondary | ICD-10-CM | POA: Diagnosis not present

## 2016-09-18 DIAGNOSIS — M47812 Spondylosis without myelopathy or radiculopathy, cervical region: Secondary | ICD-10-CM | POA: Diagnosis not present

## 2016-09-18 DIAGNOSIS — M25561 Pain in right knee: Secondary | ICD-10-CM | POA: Diagnosis not present

## 2016-09-18 DIAGNOSIS — M19011 Primary osteoarthritis, right shoulder: Secondary | ICD-10-CM | POA: Diagnosis not present

## 2016-09-18 DIAGNOSIS — M1712 Unilateral primary osteoarthritis, left knee: Secondary | ICD-10-CM | POA: Diagnosis not present

## 2016-09-18 DIAGNOSIS — E1140 Type 2 diabetes mellitus with diabetic neuropathy, unspecified: Secondary | ICD-10-CM | POA: Diagnosis not present

## 2016-09-18 DIAGNOSIS — Z79891 Long term (current) use of opiate analgesic: Secondary | ICD-10-CM | POA: Diagnosis not present

## 2016-09-18 DIAGNOSIS — M47817 Spondylosis without myelopathy or radiculopathy, lumbosacral region: Secondary | ICD-10-CM | POA: Diagnosis not present

## 2016-09-27 DIAGNOSIS — E1165 Type 2 diabetes mellitus with hyperglycemia: Secondary | ICD-10-CM | POA: Diagnosis not present

## 2016-09-27 DIAGNOSIS — H2513 Age-related nuclear cataract, bilateral: Secondary | ICD-10-CM | POA: Diagnosis not present

## 2016-09-27 DIAGNOSIS — Z8551 Personal history of malignant neoplasm of bladder: Secondary | ICD-10-CM | POA: Diagnosis not present

## 2016-09-27 DIAGNOSIS — H5203 Hypermetropia, bilateral: Secondary | ICD-10-CM | POA: Diagnosis not present

## 2016-11-13 DIAGNOSIS — M25561 Pain in right knee: Secondary | ICD-10-CM | POA: Diagnosis not present

## 2016-11-13 DIAGNOSIS — G894 Chronic pain syndrome: Secondary | ICD-10-CM | POA: Diagnosis not present

## 2016-11-13 DIAGNOSIS — M47817 Spondylosis without myelopathy or radiculopathy, lumbosacral region: Secondary | ICD-10-CM | POA: Diagnosis not present

## 2016-11-13 DIAGNOSIS — M62830 Muscle spasm of back: Secondary | ICD-10-CM | POA: Diagnosis not present

## 2016-12-19 DIAGNOSIS — M1712 Unilateral primary osteoarthritis, left knee: Secondary | ICD-10-CM | POA: Diagnosis not present

## 2016-12-19 DIAGNOSIS — M7062 Trochanteric bursitis, left hip: Secondary | ICD-10-CM | POA: Diagnosis not present

## 2016-12-24 DIAGNOSIS — Z23 Encounter for immunization: Secondary | ICD-10-CM | POA: Diagnosis not present

## 2017-01-06 DIAGNOSIS — S61310S Laceration without foreign body of right index finger with damage to nail, sequela: Secondary | ICD-10-CM | POA: Diagnosis not present

## 2017-01-09 DIAGNOSIS — M25561 Pain in right knee: Secondary | ICD-10-CM | POA: Diagnosis not present

## 2017-01-09 DIAGNOSIS — M47817 Spondylosis without myelopathy or radiculopathy, lumbosacral region: Secondary | ICD-10-CM | POA: Diagnosis not present

## 2017-01-09 DIAGNOSIS — M62830 Muscle spasm of back: Secondary | ICD-10-CM | POA: Diagnosis not present

## 2017-01-09 DIAGNOSIS — G894 Chronic pain syndrome: Secondary | ICD-10-CM | POA: Diagnosis not present

## 2017-02-03 DIAGNOSIS — I10 Essential (primary) hypertension: Secondary | ICD-10-CM | POA: Diagnosis not present

## 2017-02-03 DIAGNOSIS — K219 Gastro-esophageal reflux disease without esophagitis: Secondary | ICD-10-CM | POA: Diagnosis not present

## 2017-02-03 DIAGNOSIS — J449 Chronic obstructive pulmonary disease, unspecified: Secondary | ICD-10-CM | POA: Diagnosis not present

## 2017-02-03 DIAGNOSIS — F322 Major depressive disorder, single episode, severe without psychotic features: Secondary | ICD-10-CM | POA: Diagnosis not present

## 2017-02-03 DIAGNOSIS — E1142 Type 2 diabetes mellitus with diabetic polyneuropathy: Secondary | ICD-10-CM | POA: Diagnosis not present

## 2017-02-03 DIAGNOSIS — I700 Atherosclerosis of aorta: Secondary | ICD-10-CM | POA: Diagnosis not present

## 2017-02-03 DIAGNOSIS — E782 Mixed hyperlipidemia: Secondary | ICD-10-CM | POA: Diagnosis not present

## 2017-02-03 DIAGNOSIS — Z0000 Encounter for general adult medical examination without abnormal findings: Secondary | ICD-10-CM | POA: Diagnosis not present

## 2017-02-03 DIAGNOSIS — G629 Polyneuropathy, unspecified: Secondary | ICD-10-CM | POA: Diagnosis not present

## 2017-02-03 DIAGNOSIS — E1165 Type 2 diabetes mellitus with hyperglycemia: Secondary | ICD-10-CM | POA: Diagnosis not present

## 2017-02-03 DIAGNOSIS — E1159 Type 2 diabetes mellitus with other circulatory complications: Secondary | ICD-10-CM | POA: Diagnosis not present

## 2017-02-20 DIAGNOSIS — Z720 Tobacco use: Secondary | ICD-10-CM | POA: Diagnosis not present

## 2017-02-20 DIAGNOSIS — Z794 Long term (current) use of insulin: Secondary | ICD-10-CM | POA: Diagnosis not present

## 2017-02-20 DIAGNOSIS — E669 Obesity, unspecified: Secondary | ICD-10-CM | POA: Diagnosis not present

## 2017-02-20 DIAGNOSIS — E1142 Type 2 diabetes mellitus with diabetic polyneuropathy: Secondary | ICD-10-CM | POA: Diagnosis not present

## 2017-03-12 DIAGNOSIS — M62830 Muscle spasm of back: Secondary | ICD-10-CM | POA: Diagnosis not present

## 2017-03-12 DIAGNOSIS — Z79891 Long term (current) use of opiate analgesic: Secondary | ICD-10-CM | POA: Diagnosis not present

## 2017-03-12 DIAGNOSIS — G894 Chronic pain syndrome: Secondary | ICD-10-CM | POA: Diagnosis not present

## 2017-03-12 DIAGNOSIS — M25561 Pain in right knee: Secondary | ICD-10-CM | POA: Diagnosis not present

## 2017-03-12 DIAGNOSIS — M47817 Spondylosis without myelopathy or radiculopathy, lumbosacral region: Secondary | ICD-10-CM | POA: Diagnosis not present

## 2017-03-27 DIAGNOSIS — N400 Enlarged prostate without lower urinary tract symptoms: Secondary | ICD-10-CM | POA: Diagnosis not present

## 2017-03-27 DIAGNOSIS — Z8551 Personal history of malignant neoplasm of bladder: Secondary | ICD-10-CM | POA: Diagnosis not present

## 2017-04-11 DIAGNOSIS — J449 Chronic obstructive pulmonary disease, unspecified: Secondary | ICD-10-CM | POA: Diagnosis not present

## 2017-04-11 DIAGNOSIS — J209 Acute bronchitis, unspecified: Secondary | ICD-10-CM | POA: Diagnosis not present

## 2017-04-11 DIAGNOSIS — Z720 Tobacco use: Secondary | ICD-10-CM | POA: Diagnosis not present

## 2017-04-21 DIAGNOSIS — J441 Chronic obstructive pulmonary disease with (acute) exacerbation: Secondary | ICD-10-CM | POA: Diagnosis not present

## 2017-05-04 ENCOUNTER — Other Ambulatory Visit: Payer: Self-pay

## 2017-05-04 ENCOUNTER — Emergency Department (HOSPITAL_COMMUNITY)
Admission: EM | Admit: 2017-05-04 | Discharge: 2017-05-05 | Disposition: A | Discharge status: Home / Self Care | Payer: PPO | Attending: Emergency Medicine | Admitting: Emergency Medicine

## 2017-05-04 ENCOUNTER — Encounter (HOSPITAL_COMMUNITY): Payer: Self-pay

## 2017-05-04 ENCOUNTER — Emergency Department (HOSPITAL_COMMUNITY): Payer: PPO

## 2017-05-04 DIAGNOSIS — G500 Trigeminal neuralgia: Secondary | ICD-10-CM | POA: Diagnosis not present

## 2017-05-04 DIAGNOSIS — E119 Type 2 diabetes mellitus without complications: Secondary | ICD-10-CM | POA: Insufficient documentation

## 2017-05-04 DIAGNOSIS — Z794 Long term (current) use of insulin: Secondary | ICD-10-CM | POA: Diagnosis not present

## 2017-05-04 DIAGNOSIS — M25512 Pain in left shoulder: Secondary | ICD-10-CM | POA: Diagnosis not present

## 2017-05-04 DIAGNOSIS — Z87891 Personal history of nicotine dependence: Secondary | ICD-10-CM | POA: Diagnosis not present

## 2017-05-04 DIAGNOSIS — J449 Chronic obstructive pulmonary disease, unspecified: Secondary | ICD-10-CM | POA: Diagnosis not present

## 2017-05-04 DIAGNOSIS — I10 Essential (primary) hypertension: Secondary | ICD-10-CM | POA: Diagnosis not present

## 2017-05-04 DIAGNOSIS — I25119 Atherosclerotic heart disease of native coronary artery with unspecified angina pectoris: Secondary | ICD-10-CM | POA: Insufficient documentation

## 2017-05-04 DIAGNOSIS — R51 Headache: Secondary | ICD-10-CM | POA: Diagnosis not present

## 2017-05-04 LAB — CBC WITH DIFFERENTIAL/PLATELET
Basophils Absolute: 0 10*3/uL (ref 0.0–0.1)
Basophils Relative: 0 %
EOS ABS: 0.3 10*3/uL (ref 0.0–0.7)
EOS PCT: 3 %
HCT: 38 % — ABNORMAL LOW (ref 39.0–52.0)
HEMOGLOBIN: 12.1 g/dL — AB (ref 13.0–17.0)
LYMPHS ABS: 3.2 10*3/uL (ref 0.7–4.0)
Lymphocytes Relative: 33 %
MCH: 23.6 pg — AB (ref 26.0–34.0)
MCHC: 31.8 g/dL (ref 30.0–36.0)
MCV: 74.1 fL — ABNORMAL LOW (ref 78.0–100.0)
MONO ABS: 0.7 10*3/uL (ref 0.1–1.0)
MONOS PCT: 7 %
Neutro Abs: 5.5 10*3/uL (ref 1.7–7.7)
Neutrophils Relative %: 57 %
PLATELETS: 288 10*3/uL (ref 150–400)
RBC: 5.13 MIL/uL (ref 4.22–5.81)
RDW: 16.4 % — ABNORMAL HIGH (ref 11.5–15.5)
WBC: 9.7 10*3/uL (ref 4.0–10.5)

## 2017-05-04 NOTE — ED Triage Notes (Addendum)
Pt arrives c/o intermittent sudden sharp/shooting pain from left side of head, into eye, down shoulder and arm and down into hand starting around 1630 today. Denies N/V/D, chest pain, hx of COPD but no shortness of breath out of baseline. Pt currently c/o tingling and pain in left arm and headache, pain worsens with movement. Hx of diabetes, pt reports blood sugars have been normal. A/Ox4 and  Ambulatory in triage.

## 2017-05-04 NOTE — ED Provider Notes (Signed)
[facility] DEPT Provider Note   CSN: [id number] Arrival date & time: 05/04/17  2046     History   Chief Complaint No chief complaint on file.   HPI Michael Horn is a 67 y.o. male.  Patient presents with complaints of left-sided facial pain and headache.  Patient reports that symptoms began at 430 today.  He reports that he started having sharp and stabbing pain, "like an electric shock" across his face at that time.  He reports that symptoms would occur intermittently.  Pain would shoot across the eye or down the jaw and then go away.  Frequently reoccurred and became more severe over time.  Patient came to the ER for further evaluation.  He reports that while he was sitting in the waiting room the facial pain stopped happening.  He now is noticing a dull aching pain in the left shoulder.  This pain goes down his arm all the way to the wrist at times.  He reports it as a throbbing.  He denies any injury, but reports that the pain worsens when he moves the left arm.  He states that he has a lot of joint problems and arthritis.  He is not experiencing any chest pain or shortness of breath.      Past Medical History:  Diagnosis Date  . Arthritis    Arthritis -DDD."chronic pain med used"  . Bladder tumor    states that it was cancer  . Cancer (HCC)    Bladder cancer"-Dr.Ottelin- checks every 3 months--no signs last checks.  . Chronic knee pain RIGHT KNEE --  S/P KNEE REPLACEMENT JAN 2012--  PT STATES  NEEDS ANOTHER REPLACEMENT DUE TO JOINT RECALL  . Complication of anesthesia EMERGENCE DELIRIUM   occurred x1- 2 yrs ago.  Marland Kitchen COPD (chronic obstructive pulmonary disease) ([place])   . Coronary artery disease CARDIOLOGIST- DR Irish Lack  . Depression   . Diabetes mellitus   . GERD (gastroesophageal reflux disease)   . H/O hiatal hernia   . History of bladder cancer TCC OF BLADDER  --  FOLLOWED BY DR Karsten Ro  . History of peptic ulcer AGE 17  .  Hypertension    pt denies  . Mild obstructive sleep apnea    per study 09-26-2007--  no cpap rx  . Nocturia   . Peripheral neuropathy BOTTOM RIGHT FOOT  . Short of breath on exertion   . Urgency of urination     Patient Active Problem List   Diagnosis Date Noted  . Obesity, unspecified 07/08/2013  . Mixed hyperlipidemia 07/08/2013  . Chest pain, unspecified 07/08/2013  . Tobacco use disorder 07/08/2013  . 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 BACK SURGERY  X2  . BILATERAL KNEE ARTHROSCOPY    . BILATERAL SHOULDER SURG.  2003  . CARDIAC CATHETERIZATION  04-13-2007   ---- DR Irish Lack   NO SIGNIFICANT CAD/ NORMAL LVF  . CARPAL TUNNEL RELEASE  04-11-2006   RIGHT  . COLONOSCOPY WITH PROPOFOL N/A 05/01/2015   Procedure: COLONOSCOPY WITH PROPOFOL;  Surgeon: Garlan Fair, MD;  Location: [facility];  Service: Endoscopy;  Laterality: N/A;  . LEFT CARPAL. TUNNEL RELEASE    . LEFT EYE SURG.   2008   REMOVAL OF BB  . REPAIR RECURRENT LEFT ROTATOR CUFF TEAR  01-02-11  . SIGMOID COLECTOMY  09-29-2001   DIVERTICULITIS  . TOTAL KNEE ARTHROPLASTY  04-03-10  RIGHT  . TRANSURETHRAL RESECTION OF BLADDER TUMOR  07-06-10  . TRANSURETHRAL RESECTION OF BLADDER TUMOR  05/20/2011   Procedure: TRANSURETHRAL RESECTION OF BLADDER TUMOR (TURBT);  Surgeon: Claybon Jabs, MD;  Location: [facility];  Service: Urology;  Laterality: N/A;  GYRUS INSTILL MYTOMYCIN C NO BED  . TRANSURETHRAL RESECTION OF BLADDER TUMOR  01/31/2012   Procedure: TRANSURETHRAL RESECTION OF BLADDER TUMOR (TURBT);  Surgeon: Claybon Jabs, MD;  Location: [facility];  Service: Urology;  Laterality: N/A;  . TRANSURETHRAL RESECTION OF BLADDER TUMOR WITH GYRUS (TURBT-GYRUS) N/A 11/22/2013   Procedure: Bladder biopsy with fulgeration;  Surgeon: Claybon Jabs, MD;  Location: [facility];  Service: Urology;   Laterality: N/A;       Home Medications    Prior to Admission medications   Medication Sig Start Date End Date Taking? Authorizing Provider  aspirin EC 81 MG tablet Take 81 mg by mouth every other day.    [provider]  citalopram (CELEXA) 20 MG tablet Take 20 mg by mouth at bedtime.     [provider]  COD LIVER OIL PO Take 1 capsule by mouth daily.    [provider]  cyclobenzaprine (FLEXERIL) 10 MG tablet Take 5 mg by mouth 3 (three) times daily as needed for muscle spasms.     [provider]  docusate sodium (COLACE) 100 MG capsule Take 100 mg by mouth daily after lunch.     [provider]  esomeprazole (NEXIUM) 40 MG capsule Take 40 mg by mouth every morning.     [provider]  fenofibrate 160 MG tablet Take 1 tablet (160 mg total) by mouth daily. 05/05/13   Jettie Booze, MD  fluticasone (FLOVENT HFA) 44 MCG/ACT inhaler Inhale 1-2 puffs into the lungs daily as needed (shortness of breathe.).     [provider]  gabapentin (NEURONTIN) 300 MG capsule Take 600 mg by mouth 2 (two) times daily.    [provider]  insulin aspart protamine- aspart (NOVOLOG MIX 70/30) (70-30) 100 UNIT/ML injection Inject 24 Units into the skin 2 (two) times daily.     [provider]  lisinopril (PRINIVIL,ZESTRIL) 10 MG tablet Take 10 mg by mouth every morning.    [provider]  metFORMIN (GLUCOPHAGE) 1000 MG tablet Take 1,000 mg by mouth 2 (two) times daily.    [provider]  Oxycodone HCl 10 MG TABS Take 10 mg by mouth every 6 (six) hours as needed (pain).     [provider]  pravastatin (PRAVACHOL) 40 MG tablet Take 80 mg by mouth at bedtime.     [provider]  traZODone (DESYREL) 50 MG tablet Take 50 mg by mouth at bedtime.    [provider]    Family History Family History  Problem Relation Age of Onset  . Heart attack Mother     Social  History Social History   Tobacco Use  . Smoking status: Former Smoker    Packs/day: 1.50    Years: 50.00    Pack years: 75.00    Types: Cigarettes    Last attempt to quit: 01/24/2014    Years since quitting: 3.2  . Smokeless tobacco: Never Used  Substance Use Topics  . Alcohol use: No    Alcohol/week: 0.0 oz  . Drug use: No     Allergies   Contrast media [iodinated diagnostic agents]   Review of Systems Review of Systems  Musculoskeletal: Positive for arthralgias.  Neurological: Positive for headaches.  All other systems reviewed and are negative.    Physical Exam Updated Vital Signs BP 125/72 (BP Location: Left Arm)   Pulse 60   Temp 98.7 F (37.1 C) (Oral)   Resp 12   Ht 5\' 10"  (1.778 m)   Wt 112.5 kg (248 lb)   SpO2 96%   BMI 35.58 kg/m   Physical Exam  Constitutional: He is oriented to person, place, and time. He appears well-developed and well-nourished. No distress.  HENT:  Head: Normocephalic and atraumatic.  Right Ear: Hearing normal.  Left Ear: Hearing normal.  Nose: Nose normal.  Mouth/Throat: Oropharynx is clear and moist and mucous membranes are normal.  Eyes: Conjunctivae and EOM are normal. Pupils are equal, round, and reactive to light.  Neck: Normal range of motion. Neck supple.  Cardiovascular: Regular rhythm, S1 normal and S2 normal. Exam reveals no gallop and no friction rub.  No murmur heard. Pulmonary/Chest: Effort normal and breath sounds normal. No respiratory distress. He exhibits no tenderness.  Abdominal: Soft. Normal appearance and bowel sounds are normal. There is no hepatosplenomegaly. There is no tenderness. There is no rebound, no guarding, no tenderness at McBurney's point and negative Murphy's sign. No hernia.  Musculoskeletal:       Left shoulder: He exhibits decreased range of motion and tenderness. He exhibits no deformity.  Neurological: He is alert and oriented to person, place, and time. He has normal strength. No  cranial nerve deficit or sensory deficit. Coordination normal. GCS eye subscore is 4. GCS verbal subscore is 5. GCS motor subscore is 6.  Skin: Skin is warm, dry and intact. No rash noted. No cyanosis.  Psychiatric: He has a normal mood and affect. His speech is normal and behavior is normal. Thought content normal.  Nursing note and vitals reviewed.    ED Treatments / Results  Labs (all labs ordered are listed, but only abnormal results are displayed) Labs Reviewed  CBC WITH DIFFERENTIAL/PLATELET - Abnormal; Notable for the following components:      Result Value   Hemoglobin 12.1 (*)    HCT 38.0 (*)    MCV 74.1 (*)    MCH 23.6 (*)    RDW 16.4 (*)    All other components within normal limits  COMPREHENSIVE METABOLIC PANEL - Abnormal; Notable for the following components:   Glucose, Bld 108 (*)    BUN 22 (*)    Total Bilirubin 0.2 (*)    All other components within normal limits  LIPASE, BLOOD  SEDIMENTATION RATE  I-STAT TROPONIN, ED    EKG  EKG Interpretation  Date/Time:  Monday May 05 2017 01:06:35 EST Ventricular Rate:  63 PR Interval:    QRS Duration: 106 QT Interval:  417 QTC Calculation: 427 R Axis:   54 Text Interpretation:  Sinus rhythm Normal ECG Confirmed by Orpah Greek [phone]) on 05/05/2017 1:25:25 AM       Radiology Ct Head Wo Contrast  Result Date: 05/05/2017 CLINICAL DATA:  Left side headache EXAM: CT HEAD WITHOUT CONTRAST TECHNIQUE: Contiguous axial images were obtained from the base of the skull through the vertex without intravenous contrast. COMPARISON:  None. FINDINGS: Brain: Mild chronic microvascular disease throughout the deep white matter. Mild age related volume loss. No acute intracranial abnormality. Specifically, no hemorrhage, hydrocephalus, mass lesion, acute infarction, or significant intracranial injury. Vascular: No hyperdense vessel or unexpected calcification. Skull: No acute calvarial abnormality. Sinuses/Orbits: Mucosal  thickening in the  paranasal sinuses. Mastoid air cells are clear. Orbital soft tissues unremarkable. Other: None IMPRESSION: No acute intracranial abnormality. Atrophy, chronic microvascular disease. Mild chronic sinusitis. Electronically Signed   By: Rolm Baptise M.D.   On: 05/05/2017 00:08    Procedures Procedures (including critical care time)  Medications Ordered in ED Medications - No data to display   Initial Impression / Assessment and Plan / ED Course  I have reviewed the triage vital signs and the nursing notes.  Pertinent labs & imaging results that were available during my care of the patient were reviewed by me and considered in my medical decision making (see chart for details).     Patient presented predominantly because of a lancinating electric shock type pain going across the left side of his face.  This started earlier today and was intermittent and recurrent through the course of the day.  He has not had any jaw claudication or vision changes.  Sed rate is normal.  CT head was unremarkable.  Patient reports that the pain has stopped here in the ER, has been in the exam room for a couple of hours has not had any recurrence of the pain.  This is consistent with a trigeminal neuralgia.  He is already on chronic pain medication. He is also already on Neurontin.  Would not start Tegretol at this time, but will have follow-up with primary care doctor who can transition to Tegretol if oxycodone and Neurontin are not sufficient and symptoms are persistent and worsening.  Patient also complaining of left shoulder pain.  This appears to be musculoskeletal in nature.  The area is tender and he has severe pain if he raises his arm up his head.  Cardiac evaluation was negative.  Not expensing any chest pain or shortness of breath.  Patient reassured, continue his home therapy for pain.  Final Clinical Impressions(s) / ED Diagnoses   Final diagnoses:  Trigeminal neuralgia of left side of  face  Acute pain of left shoulder    ED Discharge Orders    None       Marsella Suman, Gwenyth Allegra, MD 05/05/17 0140

## 2017-05-04 NOTE — ED Notes (Signed)
Patient transported to CT 

## 2017-05-05 DIAGNOSIS — R51 Headache: Secondary | ICD-10-CM | POA: Diagnosis not present

## 2017-05-05 LAB — COMPREHENSIVE METABOLIC PANEL
ALK PHOS: 70 U/L (ref 38–126)
ALT: 22 U/L (ref 17–63)
ANION GAP: 11 (ref 5–15)
AST: 24 U/L (ref 15–41)
Albumin: 4.1 g/dL (ref 3.5–5.0)
BUN: 22 mg/dL — ABNORMAL HIGH (ref 6–20)
CALCIUM: 9.9 mg/dL (ref 8.9–10.3)
CO2: 23 mmol/L (ref 22–32)
Chloride: 103 mmol/L (ref 101–111)
Creatinine, Ser: 1.03 mg/dL (ref 0.61–1.24)
GFR calc non Af Amer: >60 mL/min (ref >60)
Glucose, Bld: 108 mg/dL — ABNORMAL HIGH (ref 65–99)
Potassium: 4 mmol/L (ref 3.5–5.1)
SODIUM: 137 mmol/L (ref 135–145)
TOTAL PROTEIN: 6.9 g/dL (ref 6.5–8.1)
Total Bilirubin: 0.2 mg/dL — ABNORMAL LOW (ref 0.3–1.2)

## 2017-05-05 LAB — I-STAT TROPONIN, ED: TROPONIN I, POC: 0 ng/mL (ref 0.00–0.08)

## 2017-05-05 LAB — LIPASE, BLOOD: Lipase: 28 U/L (ref 11–51)

## 2017-05-05 LAB — SEDIMENTATION RATE: SED RATE: 5 mm/h (ref 0–16)

## 2017-05-08 DIAGNOSIS — G894 Chronic pain syndrome: Secondary | ICD-10-CM | POA: Diagnosis not present

## 2017-05-08 DIAGNOSIS — M25561 Pain in right knee: Secondary | ICD-10-CM | POA: Diagnosis not present

## 2017-05-08 DIAGNOSIS — M62830 Muscle spasm of back: Secondary | ICD-10-CM | POA: Diagnosis not present

## 2017-05-08 DIAGNOSIS — M47817 Spondylosis without myelopathy or radiculopathy, lumbosacral region: Secondary | ICD-10-CM | POA: Diagnosis not present

## 2017-06-11 DIAGNOSIS — M7501 Adhesive capsulitis of right shoulder: Secondary | ICD-10-CM | POA: Diagnosis not present

## 2017-07-03 DIAGNOSIS — G894 Chronic pain syndrome: Secondary | ICD-10-CM | POA: Diagnosis not present

## 2017-07-03 DIAGNOSIS — M25561 Pain in right knee: Secondary | ICD-10-CM | POA: Diagnosis not present

## 2017-07-03 DIAGNOSIS — M47817 Spondylosis without myelopathy or radiculopathy, lumbosacral region: Secondary | ICD-10-CM | POA: Diagnosis not present

## 2017-07-03 DIAGNOSIS — M62830 Muscle spasm of back: Secondary | ICD-10-CM | POA: Diagnosis not present

## 2017-07-15 DIAGNOSIS — M1712 Unilateral primary osteoarthritis, left knee: Secondary | ICD-10-CM | POA: Diagnosis not present

## 2017-08-06 DIAGNOSIS — E782 Mixed hyperlipidemia: Secondary | ICD-10-CM | POA: Diagnosis not present

## 2017-08-06 DIAGNOSIS — J449 Chronic obstructive pulmonary disease, unspecified: Secondary | ICD-10-CM | POA: Diagnosis not present

## 2017-08-06 DIAGNOSIS — Z6835 Body mass index (BMI) 35.0-35.9, adult: Secondary | ICD-10-CM | POA: Diagnosis not present

## 2017-08-06 DIAGNOSIS — F17210 Nicotine dependence, cigarettes, uncomplicated: Secondary | ICD-10-CM | POA: Diagnosis not present

## 2017-08-06 DIAGNOSIS — I10 Essential (primary) hypertension: Secondary | ICD-10-CM | POA: Diagnosis not present

## 2017-08-06 DIAGNOSIS — F322 Major depressive disorder, single episode, severe without psychotic features: Secondary | ICD-10-CM | POA: Diagnosis not present

## 2017-08-06 DIAGNOSIS — E1159 Type 2 diabetes mellitus with other circulatory complications: Secondary | ICD-10-CM | POA: Diagnosis not present

## 2017-08-06 DIAGNOSIS — I700 Atherosclerosis of aorta: Secondary | ICD-10-CM | POA: Diagnosis not present

## 2017-08-06 DIAGNOSIS — Z794 Long term (current) use of insulin: Secondary | ICD-10-CM | POA: Diagnosis not present

## 2017-08-21 DIAGNOSIS — Z79891 Long term (current) use of opiate analgesic: Secondary | ICD-10-CM | POA: Diagnosis not present

## 2017-08-21 DIAGNOSIS — M47817 Spondylosis without myelopathy or radiculopathy, lumbosacral region: Secondary | ICD-10-CM | POA: Diagnosis not present

## 2017-08-21 DIAGNOSIS — M25561 Pain in right knee: Secondary | ICD-10-CM | POA: Diagnosis not present

## 2017-08-21 DIAGNOSIS — G894 Chronic pain syndrome: Secondary | ICD-10-CM | POA: Diagnosis not present

## 2017-08-21 DIAGNOSIS — M62830 Muscle spasm of back: Secondary | ICD-10-CM | POA: Diagnosis not present

## 2017-08-22 DIAGNOSIS — E1142 Type 2 diabetes mellitus with diabetic polyneuropathy: Secondary | ICD-10-CM | POA: Diagnosis not present

## 2017-08-22 DIAGNOSIS — Z794 Long term (current) use of insulin: Secondary | ICD-10-CM | POA: Diagnosis not present

## 2017-08-22 DIAGNOSIS — E669 Obesity, unspecified: Secondary | ICD-10-CM | POA: Diagnosis not present

## 2017-08-22 DIAGNOSIS — Z720 Tobacco use: Secondary | ICD-10-CM | POA: Diagnosis not present

## 2017-09-12 DIAGNOSIS — M65322 Trigger finger, left index finger: Secondary | ICD-10-CM | POA: Diagnosis not present

## 2017-09-22 DIAGNOSIS — Z8551 Personal history of malignant neoplasm of bladder: Secondary | ICD-10-CM | POA: Diagnosis not present

## 2017-10-16 DIAGNOSIS — E119 Type 2 diabetes mellitus without complications: Secondary | ICD-10-CM | POA: Diagnosis not present

## 2017-10-16 DIAGNOSIS — H5203 Hypermetropia, bilateral: Secondary | ICD-10-CM | POA: Diagnosis not present

## 2017-10-20 DIAGNOSIS — R509 Fever, unspecified: Secondary | ICD-10-CM | POA: Diagnosis not present

## 2017-10-20 DIAGNOSIS — B349 Viral infection, unspecified: Secondary | ICD-10-CM | POA: Diagnosis not present

## 2017-10-20 DIAGNOSIS — E119 Type 2 diabetes mellitus without complications: Secondary | ICD-10-CM | POA: Diagnosis not present

## 2017-10-20 DIAGNOSIS — Z7984 Long term (current) use of oral hypoglycemic drugs: Secondary | ICD-10-CM | POA: Diagnosis not present

## 2017-10-27 DIAGNOSIS — M7501 Adhesive capsulitis of right shoulder: Secondary | ICD-10-CM | POA: Diagnosis not present

## 2017-10-31 DIAGNOSIS — X58XXXA Exposure to other specified factors, initial encounter: Secondary | ICD-10-CM | POA: Diagnosis not present

## 2017-10-31 DIAGNOSIS — S46811A Strain of other muscles, fascia and tendons at shoulder and upper arm level, right arm, initial encounter: Secondary | ICD-10-CM | POA: Diagnosis not present

## 2017-10-31 DIAGNOSIS — M25511 Pain in right shoulder: Secondary | ICD-10-CM | POA: Diagnosis not present

## 2017-11-03 DIAGNOSIS — G894 Chronic pain syndrome: Secondary | ICD-10-CM | POA: Diagnosis not present

## 2017-11-03 DIAGNOSIS — M47817 Spondylosis without myelopathy or radiculopathy, lumbosacral region: Secondary | ICD-10-CM | POA: Diagnosis not present

## 2017-11-03 DIAGNOSIS — M62830 Muscle spasm of back: Secondary | ICD-10-CM | POA: Diagnosis not present

## 2017-11-03 DIAGNOSIS — M25561 Pain in right knee: Secondary | ICD-10-CM | POA: Diagnosis not present

## 2017-11-04 DIAGNOSIS — S8001XA Contusion of right knee, initial encounter: Secondary | ICD-10-CM | POA: Diagnosis not present

## 2017-11-04 DIAGNOSIS — M25561 Pain in right knee: Secondary | ICD-10-CM | POA: Diagnosis not present

## 2017-11-05 DIAGNOSIS — M25561 Pain in right knee: Secondary | ICD-10-CM | POA: Diagnosis not present

## 2017-11-11 DIAGNOSIS — M25561 Pain in right knee: Secondary | ICD-10-CM | POA: Diagnosis not present

## 2017-11-11 DIAGNOSIS — Z96651 Presence of right artificial knee joint: Secondary | ICD-10-CM | POA: Diagnosis not present

## 2017-11-24 DIAGNOSIS — M67911 Unspecified disorder of synovium and tendon, right shoulder: Secondary | ICD-10-CM | POA: Diagnosis not present

## 2017-12-04 DIAGNOSIS — M25512 Pain in left shoulder: Secondary | ICD-10-CM | POA: Diagnosis not present

## 2017-12-29 DIAGNOSIS — M25561 Pain in right knee: Secondary | ICD-10-CM | POA: Diagnosis not present

## 2017-12-29 DIAGNOSIS — M47817 Spondylosis without myelopathy or radiculopathy, lumbosacral region: Secondary | ICD-10-CM | POA: Diagnosis not present

## 2017-12-29 DIAGNOSIS — G894 Chronic pain syndrome: Secondary | ICD-10-CM | POA: Diagnosis not present

## 2017-12-29 DIAGNOSIS — Z79891 Long term (current) use of opiate analgesic: Secondary | ICD-10-CM | POA: Diagnosis not present

## 2017-12-29 DIAGNOSIS — M62830 Muscle spasm of back: Secondary | ICD-10-CM | POA: Diagnosis not present

## 2018-01-27 DIAGNOSIS — Z9889 Other specified postprocedural states: Secondary | ICD-10-CM | POA: Diagnosis not present

## 2018-01-27 DIAGNOSIS — M25512 Pain in left shoulder: Secondary | ICD-10-CM | POA: Diagnosis not present

## 2018-02-11 DIAGNOSIS — J449 Chronic obstructive pulmonary disease, unspecified: Secondary | ICD-10-CM | POA: Diagnosis not present

## 2018-02-11 DIAGNOSIS — E119 Type 2 diabetes mellitus without complications: Secondary | ICD-10-CM | POA: Diagnosis not present

## 2018-02-11 DIAGNOSIS — I10 Essential (primary) hypertension: Secondary | ICD-10-CM | POA: Diagnosis not present

## 2018-02-11 DIAGNOSIS — K219 Gastro-esophageal reflux disease without esophagitis: Secondary | ICD-10-CM | POA: Diagnosis not present

## 2018-02-11 DIAGNOSIS — E669 Obesity, unspecified: Secondary | ICD-10-CM | POA: Diagnosis not present

## 2018-02-11 DIAGNOSIS — F322 Major depressive disorder, single episode, severe without psychotic features: Secondary | ICD-10-CM | POA: Diagnosis not present

## 2018-02-11 DIAGNOSIS — E782 Mixed hyperlipidemia: Secondary | ICD-10-CM | POA: Diagnosis not present

## 2018-02-11 DIAGNOSIS — C679 Malignant neoplasm of bladder, unspecified: Secondary | ICD-10-CM | POA: Diagnosis not present

## 2018-02-11 DIAGNOSIS — Z79899 Other long term (current) drug therapy: Secondary | ICD-10-CM | POA: Diagnosis not present

## 2018-02-11 DIAGNOSIS — G629 Polyneuropathy, unspecified: Secondary | ICD-10-CM | POA: Diagnosis not present

## 2018-02-11 DIAGNOSIS — Z720 Tobacco use: Secondary | ICD-10-CM | POA: Diagnosis not present

## 2018-02-11 DIAGNOSIS — E1142 Type 2 diabetes mellitus with diabetic polyneuropathy: Secondary | ICD-10-CM | POA: Diagnosis not present

## 2018-02-11 DIAGNOSIS — I700 Atherosclerosis of aorta: Secondary | ICD-10-CM | POA: Diagnosis not present

## 2018-02-11 DIAGNOSIS — Z794 Long term (current) use of insulin: Secondary | ICD-10-CM | POA: Diagnosis not present

## 2018-02-11 DIAGNOSIS — Z0000 Encounter for general adult medical examination without abnormal findings: Secondary | ICD-10-CM | POA: Diagnosis not present

## 2018-02-11 DIAGNOSIS — Z5181 Encounter for therapeutic drug level monitoring: Secondary | ICD-10-CM | POA: Diagnosis not present

## 2018-02-11 DIAGNOSIS — E1165 Type 2 diabetes mellitus with hyperglycemia: Secondary | ICD-10-CM | POA: Diagnosis not present

## 2018-02-12 DIAGNOSIS — Z9889 Other specified postprocedural states: Secondary | ICD-10-CM | POA: Diagnosis not present

## 2018-02-12 DIAGNOSIS — M25512 Pain in left shoulder: Secondary | ICD-10-CM | POA: Diagnosis not present

## 2018-02-19 DIAGNOSIS — M25512 Pain in left shoulder: Secondary | ICD-10-CM | POA: Diagnosis not present

## 2018-02-23 DIAGNOSIS — G894 Chronic pain syndrome: Secondary | ICD-10-CM | POA: Diagnosis not present

## 2018-02-23 DIAGNOSIS — M62830 Muscle spasm of back: Secondary | ICD-10-CM | POA: Diagnosis not present

## 2018-02-23 DIAGNOSIS — M47817 Spondylosis without myelopathy or radiculopathy, lumbosacral region: Secondary | ICD-10-CM | POA: Diagnosis not present

## 2018-02-23 DIAGNOSIS — M25561 Pain in right knee: Secondary | ICD-10-CM | POA: Diagnosis not present

## 2018-02-26 DIAGNOSIS — M25512 Pain in left shoulder: Secondary | ICD-10-CM | POA: Diagnosis not present

## 2018-03-03 DIAGNOSIS — M25812 Other specified joint disorders, left shoulder: Secondary | ICD-10-CM | POA: Diagnosis not present

## 2018-03-03 DIAGNOSIS — M25512 Pain in left shoulder: Secondary | ICD-10-CM | POA: Diagnosis not present

## 2018-03-23 ENCOUNTER — Telehealth: Payer: Self-pay

## 2018-03-23 NOTE — Telephone Encounter (Signed)
   [facility] Pre-operative Risk Assessment    Request for surgical clearance:  1. What type of surgery is being performed? Left Reverse Total Shoulder Arthroplasty   2. When is this surgery scheduled? 04/08/2018   3. What type of clearance is required (medical clearance vs. Pharmacy clearance to hold med vs. Both)? Pharmacy  4. Are there any medications that need to be held prior to surgery and how long? Aspirin 5-7 days prior to surgery   5. Practice name and name of physician performing surgery? Dr. Victorino December   6. What is your office phone number [phone]    7.   What is your office fax number [phone]  8.   Anesthesia type (None, local, MAC, general) ? Choice   Michael Horn 03/23/2018, 12:43 PM  _________________________________________________________________   (provider comments below)

## 2018-03-24 NOTE — Telephone Encounter (Signed)
   Primary Cardiologist: Dr. Irish Lack (last seen in 2015) Chart reviewed as part of pre-operative protocol coverage. Because of Michael Horn's past medical history and time since last visit, he/she will require a follow-up visit in order to better assess preoperative cardiovascular risk.  Surgeon is only requiring ASA clearance.   Pre-op covering staff: - Please contact requesting surgeon's office via preferred method (i.e, phone, fax) to inform them of need for appointment prior to surgery if medical clearance required (did not asked) otherwise contact PCP for ASA clearance.   [place], [place]  03/24/2018, 2:16 PM

## 2018-03-24 NOTE — Telephone Encounter (Signed)
Pt not seen in right at 5 years.  Will send back to the surgeons office to have a new pt referral sent over to get appt made. Not sure what insurance requires.

## 2018-03-31 NOTE — Pre-Procedure Instructions (Addendum)
Michael Horn  03/31/2018      Your procedure is scheduled on April 08, 2018.  Report to [facility] Admitting at 10:45 A.M.  Call this number if you have problems the morning of surgery:  [phone]   Remember:  Do not eat or drink after midnight.    Take these medicines the morning of surgery with A SIP OF WATER : Esomeprazole (Nexium) Gabapentin (Neurontin) Albuterol (Proair) inhaler if needed--bring with you Oxycondone- if needed   Follow your Doctor's regarding your Aspirin.  If no instructions were given, call your doctor.  7 days prior to surgery STOP taking any Aspirin (unless otherwise instructed by your surgeon), Aleve, Naproxen, Ibuprofen, Motrin, Advil, Goody's, BC's, all herbal medications, fish oil, and all vitamins.   WHAT DO I DO ABOUT MY DIABETES MEDICATION?   Marland Kitchen Do not take oral diabetes medicines (pills) the morning of surgery. DO NOT TAKE METFORMIN (GLUCOPHAGE) the morning of surgery.   . THE NIGHT BEFORE SURGERY, take 21 units of Novolog 70/30 insulin.   . THE MORNING OF SURGERY, DO NOT take insulin.   How to Manage Your Diabetes Before and After Surgery  Why is it important to control my blood sugar before and after surgery? . Improving blood sugar levels before and after surgery helps healing and can limit problems. . A way of improving blood sugar control is eating a healthy diet by: o  Eating less sugar and carbohydrates o  Increasing activity/exercise o  Talking with your doctor about reaching your blood sugar goals . High blood sugars (greater than 180 mg/dL) can raise your risk of infections and slow your recovery, so you will need to focus on controlling your diabetes during the weeks before surgery. . Make sure that the doctor who takes care of your diabetes knows about your planned surgery including the date and location.  How do I manage my blood sugar before surgery? . Check your blood sugar at least 4 times a day,  starting 2 days before surgery, to make sure that the level is not too high or low. o Check your blood sugar the morning of your surgery when you wake up and every 2 hours until you get to the Short Stay unit. . If your blood sugar is less than 70 mg/dL, you will need to treat for low blood sugar: o Do not take insulin. o Treat a low blood sugar (less than 70 mg/dL) with  cup of clear juice (cranberry or apple), 4 glucose tablets, OR glucose gel. o Recheck blood sugar in 15 minutes after treatment (to make sure it is greater than 70 mg/dL). If your blood sugar is not greater than 70 mg/dL on recheck, call [phone] for further instructions. . Report your blood sugar to the short stay nurse when you get to Short Stay.  . If you are admitted to the hospital after surgery: o Your blood sugar will be checked by the staff and you will probably be given insulin after surgery (instead of oral diabetes medicines) to make sure you have good blood sugar levels. o The goal for blood sugar control after surgery is 80-180 mg/dL.     Do not wear jewelry.  Do not wear lotions, powders, or perfumes, or deodorant.  Do not shave 48 hours prior to surgery.  Men may shave face and neck.  Do not bring valuables to the hospital.  [facility] is not responsible for any belongings or valuables.  Contacts,  dentures or bridgework may not be worn into surgery.  Leave your suitcase in the car.  After surgery it may be brought to your room.  For patients admitted to the hospital, discharge time will be determined by your treatment team.  Patients discharged the day of surgery will not be allowed to drive home.   Special instructions:   [facility]- Preparing For Surgery  Before surgery, you can play an important role. Because skin is not sterile, your skin needs to be as free of germs as possible. You can reduce the number of germs on your skin by washing with CHG (chlorahexidine gluconate) Soap before surgery.   CHG is an antiseptic cleaner which kills germs and bonds with the skin to continue killing germs even after washing.    Oral Hygiene is also important to reduce your risk of infection.  Remember - BRUSH YOUR TEETH THE MORNING OF SURGERY WITH YOUR REGULAR TOOTHPASTE  Please do not use if you have an allergy to CHG or antibacterial soaps. If your skin becomes reddened/irritated stop using the CHG.  Do not shave (including legs and underarms) for at least 48 hours prior to first CHG shower. It is OK to shave your face.  Please follow these instructions carefully.   1. Shower the NIGHT BEFORE SURGERY and the MORNING OF SURGERY with CHG.   2. If you chose to wash your hair, wash your hair first as usual with your normal shampoo.  3. After you shampoo, rinse your hair and body thoroughly to remove the shampoo.  4. Use CHG as you would any other liquid soap. You can apply CHG directly to the skin and wash gently with a scrungie or a clean washcloth.   5. Apply the CHG Soap to your body ONLY FROM THE NECK DOWN.  Do not use on open wounds or open sores. Avoid contact with your eyes, ears, mouth and genitals (private parts). Wash Face and genitals (private parts)  with your normal soap.  6. Wash thoroughly, paying special attention to the area where your surgery will be performed.  7. Thoroughly rinse your body with warm water from the neck down.  8. DO NOT shower/wash with your normal soap after using and rinsing off the CHG Soap.  9. Pat yourself dry with a CLEAN TOWEL.  10. Wear CLEAN PAJAMAS to bed the night before surgery, wear comfortable clothes the morning of surgery  11. Place CLEAN SHEETS on your bed the night of your first shower and DO NOT SLEEP WITH PETS.    Day of Surgery:  Do not apply any deodorants/lotions.  Please wear clean clothes to the hospital/surgery center.   Remember to brush your teeth WITH YOUR REGULAR TOOTHPASTE.    Please read over the following fact  sheets that you were given.

## 2018-04-01 ENCOUNTER — Other Ambulatory Visit: Payer: Self-pay

## 2018-04-01 ENCOUNTER — Encounter (HOSPITAL_COMMUNITY): Payer: Self-pay

## 2018-04-01 ENCOUNTER — Encounter (HOSPITAL_COMMUNITY)
Admission: RE | Admit: 2018-04-01 | Discharge: 2018-04-01 | Disposition: A | Discharge status: Home / Self Care | Payer: PPO | Source: Ambulatory Visit | Attending: Orthopedic Surgery | Admitting: Orthopedic Surgery

## 2018-04-01 DIAGNOSIS — Z01812 Encounter for preprocedural laboratory examination: Secondary | ICD-10-CM | POA: Insufficient documentation

## 2018-04-01 HISTORY — DX: Headache: R51

## 2018-04-01 HISTORY — DX: Other seasonal allergic rhinitis: J30.2

## 2018-04-01 HISTORY — DX: Headache, unspecified: R51.9

## 2018-04-01 LAB — CBC
HCT: 40.6 % (ref 39.0–52.0)
Hemoglobin: 12.3 g/dL — ABNORMAL LOW (ref 13.0–17.0)
MCH: 22.4 pg — ABNORMAL LOW (ref 26.0–34.0)
MCHC: 30.3 g/dL (ref 30.0–36.0)
MCV: 74 fL — ABNORMAL LOW (ref 80.0–100.0)
Platelets: 294 10*3/uL (ref 150–400)
RBC: 5.49 MIL/uL (ref 4.22–5.81)
RDW: 15 % (ref 11.5–15.5)
WBC: 9.5 10*3/uL (ref 4.0–10.5)
nRBC: 0 % (ref 0.0–0.2)

## 2018-04-01 LAB — BASIC METABOLIC PANEL
Anion gap: 9 (ref 5–15)
BUN: 12 mg/dL (ref 8–23)
CALCIUM: 9.4 mg/dL (ref 8.9–10.3)
CO2: 28 mmol/L (ref 22–32)
Chloride: 103 mmol/L (ref 98–111)
Creatinine, Ser: 0.94 mg/dL (ref 0.61–1.24)
GFR calc Af Amer: >60 mL/min (ref >60)
GFR calc non Af Amer: >60 mL/min (ref >60)
Glucose, Bld: 136 mg/dL — ABNORMAL HIGH (ref 70–99)
Potassium: 4.2 mmol/L (ref 3.5–5.1)
Sodium: 140 mmol/L (ref 135–145)

## 2018-04-01 LAB — HEMOGLOBIN A1C
Hgb A1c MFr Bld: 7.2 % — ABNORMAL HIGH (ref 4.8–5.6)
Mean Plasma Glucose: 159.94 mg/dL

## 2018-04-01 LAB — GLUCOSE, CAPILLARY: Glucose-Capillary: 161 mg/dL — ABNORMAL HIGH (ref 70–99)

## 2018-04-01 LAB — SURGICAL PCR SCREEN
MRSA, PCR: NEGATIVE
Staphylococcus aureus: NEGATIVE

## 2018-04-01 NOTE — Progress Notes (Signed)
PCP: Dr. Mayra Neer Cardiologist: Saw Dr. Irish Lack 5 years ago--told to only follow up prn  EKG: 05-05-2017 CXR: n/a ECHO: denies Stress Test: 2007 Cardiac Cath: 2009 Sleep Study--can't remember, thinks he had one done maybe 15 years ago, never wore cpap  Fasting Blood Sugar- 95-118 Checks Blood Sugar "infrequently" at home  Verbalized understanding of what to do DOS for CBG <70. Pt calling PCP for ASA instructions.   Patient denies shortness of breath, fever, cough, and chest pain at PAT appointment.  Patient verbalized understanding of instructions provided today at the PAT appointment.  Patient asked to review instructions at home and day of surgery.   Chart sent to anesthesia for review.

## 2018-04-02 NOTE — Progress Notes (Signed)
Anesthesia Chart Review:  Case:  [id number] Date/Time:  04/08/18 1230   Procedure:  REVERSE SHOULDER ARTHROPLASTY (Left ) - 2.5 hours   Anesthesia type:  Choice   Pre-op diagnosis:  joint derangements of left shoulder   Location:  [facility] OR ROOM 10 / [facility]   Surgeon:  Nicholes Stairs, MD      DISCUSSION: 68 yo male former smoker. Pertinent hx includes IDDMII, HTN, COPD, GERD, Bladder CA s/p resection, Mild OSA not on CPAP, Complication of anesthesia (emergence delirium)  Pt was seen by Dr. Irish Lack in 2007 for chest pain and underwent a nuclear stress test that showed EF 73% and possible of a small area of inferior ischemia. He subsequently had a cath in 2009 for eval of CP. Cath showed no significant CAD, normal ventricular function, normal hemodynamics. Per Dr. Hassell Done note the pt's pain was felt to be noncardiac in origin. He was seen again by Dr. Irish Lack in 2015 for similar c/o and his note at that time reiterated that the pt's pain was likely noncardiac and the pt was to f/u PRN.  Pt has medical clearance from PCP Dr. Derrill Memo dated 03/16/2018 stating pt at moderate risk due to [facility] and COPD.  Anticipate he can proceed as planned barring acute status change.  VS: BP 116/84   Pulse 100   Temp 36.9 C   Resp 20   Ht 5' 10.5" (1.791 m)   Wt 111 kg   SpO2 95%   BMI 34.63 kg/m   PROVIDERS: Mayra Neer, MD is PCP   LABS: Labs reviewed: Acceptable for surgery. (all labs ordered are listed, but only abnormal results are displayed)  Labs Reviewed  GLUCOSE, CAPILLARY - Abnormal; Notable for the following components:      Result Value   Glucose-Capillary 161 (*)    All other components within normal limits  BASIC METABOLIC PANEL - Abnormal; Notable for the following components:   Glucose, Bld 136 (*)    All other components within normal limits  HEMOGLOBIN A1C - Abnormal; Notable for the following components:   Hgb A1c MFr Bld 7.2 (*)    All other components within normal  limits  CBC - Abnormal; Notable for the following components:   Hemoglobin 12.3 (*)    MCV 74.0 (*)    MCH 22.4 (*)    All other components within normal limits  SURGICAL PCR SCREEN     IMAGES: CHEST  2 VIEW 06/24/2016:  COMPARISON:  Chest x-ray of December 13, 2011  FINDINGS: The lungs are adequately inflated and clear. The heart and pulmonary vascularity are normal. The mediastinum is normal in width. There is calcification in the wall of the aortic arch. There is no pleural effusion. The bony thorax exhibits no acute abnormality.  IMPRESSION: There is no acute cardiopulmonary abnormality. No objective evidence of metastatic disease. If the patient's cough persists and sinus disease has been excluded, chest CT scanning would be a useful next imaging step.  Thoracic aortic atherosclerosis.  EKG: 05/05/2017: Sinus rhythm, rate 63.  CV: Cath  04/13/2007: IMPRESSION:  1. No significant coronary artery disease.  The patient is likely      having noncardiac chest pain.  2. Normal ventricular function.  3. No abdominal aortic aneurysm.  4. Normal hemodynamics.  5. No renal artery stenosis   RECOMMENDATIONS:  Continue aggressive risk factor modification and  smoking cessation attempts.  I will follow-up with him in the office.  Nuclear stress 10/11/2005:  Impressions:  1.  Predominantly fixed inferior defect consistent with diaphragmatic attenuation.  There is some mild reversibility in the mid to basal inferior wall which could represent ischemia.  There are no other reversible perfusion defects. 2.  Normal left ventricular contraction with an ejection fraction of 73%. 3.  Unremarkable pharmacologic stress test.  Recommendations: This study could represent a small area of inferior ischemia.  If the patient's symptoms continue despite medical therapy, I would consider coronary angiogram.  I will follow-up with the patient in the office.  Past Medical History:  Diagnosis  Date  . Arthritis    Arthritis -DDD."chronic pain med used"  . Bladder tumor    states that it was cancer  . Cancer (HCC)    Bladder cancer"-Dr.Ottelin- checks every 3 months--no signs last checks.  . Chronic knee pain RIGHT KNEE --  S/P KNEE REPLACEMENT JAN 2012--  PT STATES  NEEDS ANOTHER REPLACEMENT DUE TO JOINT RECALL  . Complication of anesthesia EMERGENCE DELIRIUM   occurred x1- 2 yrs ago.  Marland Kitchen COPD (chronic obstructive pulmonary disease) ([place])   . Coronary artery disease CARDIOLOGIST- DR Irish Lack  . Depression   . Diabetes mellitus   . GERD (gastroesophageal reflux disease)   . H/O hiatal hernia   . Headache   . History of bladder cancer TCC OF BLADDER  --  FOLLOWED BY DR Karsten Ro  . History of peptic ulcer AGE 26  . Hypertension    pt denies  . Mild obstructive sleep apnea    per study 09-26-2007--  no cpap rx  . Nocturia   . Peripheral neuropathy BOTTOM RIGHT FOOT  . Seasonal allergies   . Short of breath on exertion   . Urgency of urination     Past Surgical History:  Procedure Laterality Date  . BACK SURGERY  X2  . BILATERAL KNEE ARTHROSCOPY    . BILATERAL SHOULDER SURG.  2003  . CARDIAC CATHETERIZATION  04-13-2007   ---- DR Irish Lack   NO SIGNIFICANT CAD/ NORMAL LVF  . CARPAL TUNNEL RELEASE  04-11-2006   RIGHT  . COLONOSCOPY WITH PROPOFOL N/A 05/01/2015   Procedure: COLONOSCOPY WITH PROPOFOL;  Surgeon: Garlan Fair, MD;  Location: [facility];  Service: Endoscopy;  Laterality: N/A;  . LEFT CARPAL. TUNNEL RELEASE    . LEFT EYE SURG.   2008   REMOVAL OF BB  . REPAIR RECURRENT LEFT ROTATOR CUFF TEAR  01-02-11  . SIGMOID COLECTOMY  09-29-2001   DIVERTICULITIS  . TOTAL KNEE ARTHROPLASTY  04-03-10   RIGHT  . TRANSURETHRAL RESECTION OF BLADDER TUMOR  07-06-10  . TRANSURETHRAL RESECTION OF BLADDER TUMOR  05/20/2011   Procedure: TRANSURETHRAL RESECTION OF BLADDER TUMOR (TURBT);  Surgeon: Claybon Jabs, MD;  Location: [facility];  Service: Urology;   Laterality: N/A;  GYRUS INSTILL MYTOMYCIN C NO BED  . TRANSURETHRAL RESECTION OF BLADDER TUMOR  01/31/2012   Procedure: TRANSURETHRAL RESECTION OF BLADDER TUMOR (TURBT);  Surgeon: Claybon Jabs, MD;  Location: [facility];  Service: Urology;  Laterality: N/A;  . TRANSURETHRAL RESECTION OF BLADDER TUMOR WITH GYRUS (TURBT-GYRUS) N/A 11/22/2013   Procedure: Bladder biopsy with fulgeration;  Surgeon: Claybon Jabs, MD;  Location: [facility];  Service: Urology;  Laterality: N/A;    MEDICATIONS: . albuterol (PROAIR HFA) 108 (90 Base) MCG/ACT inhaler  . aspirin EC 81 MG tablet  . budesonide-formoterol (SYMBICORT) 80-4.5 MCG/ACT inhaler  . citalopram (CELEXA) 20 MG tablet  . esomeprazole (NEXIUM) 20 MG capsule  .  fenofibrate 160 MG tablet  . gabapentin (NEURONTIN) 300 MG capsule  . insulin aspart protamine- aspart (NOVOLOG MIX 70/30) (70-30) 100 UNIT/ML injection  . lisinopril (PRINIVIL,ZESTRIL) 10 MG tablet  . metFORMIN (GLUCOPHAGE) 1000 MG tablet  . Oxycodone HCl 10 MG TABS  . pravastatin (PRAVACHOL) 40 MG tablet  . traZODone (DESYREL) 50 MG tablet  . trospium (SANCTURA) 20 MG tablet   No current facility-administered medications for this encounter.     Wynonia Musty [facility] Short Stay Center/Anesthesiology Phone [phone] 04/02/2018 1:50 PM

## 2018-04-02 NOTE — Anesthesia Preprocedure Evaluation (Addendum)
Anesthesia Evaluation  Patient identified by MRN, date of birth, ID band Patient awake    Reviewed: Allergy & Precautions, NPO status , Patient's Chart, lab work & pertinent test results  History of Anesthesia Complications Negative for: history of anesthetic complications  Airway Mallampati: III  TM Distance: >3 FB Neck ROM: Full    Dental no notable dental hx. (+) Edentulous Upper, Edentulous Lower   Pulmonary sleep apnea , COPD, Current Smoker,    Pulmonary exam normal        Cardiovascular hypertension, Normal cardiovascular exam     Neuro/Psych negative neurological ROS  negative psych ROS   GI/Hepatic Neg liver ROS, hiatal hernia, GERD  ,  Endo/Other  diabetes, Type 2  Renal/GU negative Renal ROS  negative genitourinary   Musculoskeletal  (+) Arthritis ,   Abdominal   Peds  Hematology negative hematology ROS (+)   Anesthesia Other Findings 68 yo male former smoker. Pertinent hx includes IDDMII, HTN, COPD, GERD, Bladder CA s/p resection, Mild OSA not on CPAP, Complication of anesthesia (emergence delirium)  Pt was seen by Dr. Irish Lack in 2007 for chest pain and underwent a nuclear stress test that showed EF 73% and possible of a small area of inferior ischemia. He subsequently had a cath in 2009 for eval of CP. Cath showed no significant CAD, normal ventricular function, normal hemodynamics. Per Dr. Hassell Done note the pt's pain was felt to be noncardiac in origin. He was seen again by Dr. Irish Lack in 2015 for similar c/o and his note at that time reiterated that the pt's pain was likely noncardiac and the pt was to f/u PRN.  Pt has medical clearance from PCP Dr. Derrill Memo dated 03/16/2018 stating pt at moderate risk due to [facility] and COPD.  Reproductive/Obstetrics                           Anesthesia Physical Anesthesia Plan  ASA: III  Anesthesia Plan: General   Post-op Pain  Management: GA combined w/ Regional for post-op pain   Induction: Intravenous  PONV Risk Score and Plan: 1 and Ondansetron, Dexamethasone, Midazolam and Treatment may vary due to age or medical condition  Airway Management Planned: Oral ETT  Additional Equipment: None  Intra-op Plan:   Post-operative Plan: Extubation in OR  Informed Consent: I have reviewed the patients History and Physical, chart, labs and discussed the procedure including the risks, benefits and alternatives for the proposed anesthesia with the patient or authorized representative who has indicated his/her understanding and acceptance.     Dental advisory given  Plan Discussed with:   Anesthesia Plan Comments: (See PAT note by Karoline Caldwell, PA-C )       Anesthesia Quick Evaluation

## 2018-04-08 ENCOUNTER — Inpatient Hospital Stay (HOSPITAL_COMMUNITY): Payer: PPO | Admitting: Anesthesiology

## 2018-04-08 ENCOUNTER — Other Ambulatory Visit: Payer: Self-pay

## 2018-04-08 ENCOUNTER — Inpatient Hospital Stay (HOSPITAL_COMMUNITY): Payer: PPO | Admitting: Physician Assistant

## 2018-04-08 ENCOUNTER — Encounter (HOSPITAL_COMMUNITY)
Admission: RE | Disposition: A | Discharge status: Home / Self Care | Payer: Self-pay | Source: Home / Self Care | Attending: Orthopedic Surgery

## 2018-04-08 ENCOUNTER — Encounter (HOSPITAL_COMMUNITY): Payer: Self-pay

## 2018-04-08 ENCOUNTER — Inpatient Hospital Stay (HOSPITAL_COMMUNITY): Payer: PPO

## 2018-04-08 ENCOUNTER — Inpatient Hospital Stay (HOSPITAL_COMMUNITY)
Admission: RE | Admit: 2018-04-08 | Discharge: 2018-04-09 | DRG: 483 | Disposition: A | Discharge status: Home / Self Care | Payer: PPO | Attending: Orthopedic Surgery | Admitting: Orthopedic Surgery

## 2018-04-08 DIAGNOSIS — M75102 Unspecified rotator cuff tear or rupture of left shoulder, not specified as traumatic: Secondary | ICD-10-CM | POA: Diagnosis not present

## 2018-04-08 DIAGNOSIS — G8918 Other acute postprocedural pain: Secondary | ICD-10-CM | POA: Diagnosis not present

## 2018-04-08 DIAGNOSIS — I10 Essential (primary) hypertension: Secondary | ICD-10-CM | POA: Diagnosis present

## 2018-04-08 DIAGNOSIS — M19212 Secondary osteoarthritis, left shoulder: Secondary | ICD-10-CM | POA: Diagnosis present

## 2018-04-08 DIAGNOSIS — F17210 Nicotine dependence, cigarettes, uncomplicated: Secondary | ICD-10-CM | POA: Diagnosis not present

## 2018-04-08 DIAGNOSIS — Z8249 Family history of ischemic heart disease and other diseases of the circulatory system: Secondary | ICD-10-CM | POA: Diagnosis not present

## 2018-04-08 DIAGNOSIS — Z8551 Personal history of malignant neoplasm of bladder: Secondary | ICD-10-CM

## 2018-04-08 DIAGNOSIS — Z91041 Radiographic dye allergy status: Secondary | ICD-10-CM | POA: Diagnosis not present

## 2018-04-08 DIAGNOSIS — M94212 Chondromalacia, left shoulder: Secondary | ICD-10-CM | POA: Diagnosis present

## 2018-04-08 DIAGNOSIS — I2510 Atherosclerotic heart disease of native coronary artery without angina pectoris: Secondary | ICD-10-CM | POA: Diagnosis present

## 2018-04-08 DIAGNOSIS — Z794 Long term (current) use of insulin: Secondary | ICD-10-CM

## 2018-04-08 DIAGNOSIS — J449 Chronic obstructive pulmonary disease, unspecified: Secondary | ICD-10-CM | POA: Diagnosis present

## 2018-04-08 DIAGNOSIS — Z7982 Long term (current) use of aspirin: Secondary | ICD-10-CM

## 2018-04-08 DIAGNOSIS — Z96612 Presence of left artificial shoulder joint: Secondary | ICD-10-CM

## 2018-04-08 DIAGNOSIS — F329 Major depressive disorder, single episode, unspecified: Secondary | ICD-10-CM | POA: Diagnosis not present

## 2018-04-08 DIAGNOSIS — Z9049 Acquired absence of other specified parts of digestive tract: Secondary | ICD-10-CM | POA: Diagnosis not present

## 2018-04-08 DIAGNOSIS — Z471 Aftercare following joint replacement surgery: Secondary | ICD-10-CM | POA: Diagnosis not present

## 2018-04-08 DIAGNOSIS — K219 Gastro-esophageal reflux disease without esophagitis: Secondary | ICD-10-CM | POA: Diagnosis present

## 2018-04-08 DIAGNOSIS — E1142 Type 2 diabetes mellitus with diabetic polyneuropathy: Secondary | ICD-10-CM | POA: Diagnosis present

## 2018-04-08 DIAGNOSIS — M19012 Primary osteoarthritis, left shoulder: Secondary | ICD-10-CM | POA: Diagnosis present

## 2018-04-08 DIAGNOSIS — M25811 Other specified joint disorders, right shoulder: Secondary | ICD-10-CM | POA: Diagnosis not present

## 2018-04-08 DIAGNOSIS — Z96651 Presence of right artificial knee joint: Secondary | ICD-10-CM | POA: Diagnosis not present

## 2018-04-08 DIAGNOSIS — Z906 Acquired absence of other parts of urinary tract: Secondary | ICD-10-CM | POA: Diagnosis not present

## 2018-04-08 DIAGNOSIS — Z8711 Personal history of peptic ulcer disease: Secondary | ICD-10-CM | POA: Diagnosis not present

## 2018-04-08 DIAGNOSIS — Z79891 Long term (current) use of opiate analgesic: Secondary | ICD-10-CM

## 2018-04-08 DIAGNOSIS — M24812 Other specific joint derangements of left shoulder, not elsewhere classified: Secondary | ICD-10-CM | POA: Diagnosis not present

## 2018-04-08 DIAGNOSIS — Z7951 Long term (current) use of inhaled steroids: Secondary | ICD-10-CM

## 2018-04-08 DIAGNOSIS — G4733 Obstructive sleep apnea (adult) (pediatric): Secondary | ICD-10-CM | POA: Diagnosis present

## 2018-04-08 DIAGNOSIS — J302 Other seasonal allergic rhinitis: Secondary | ICD-10-CM | POA: Diagnosis not present

## 2018-04-08 DIAGNOSIS — Z79899 Other long term (current) drug therapy: Secondary | ICD-10-CM

## 2018-04-08 DIAGNOSIS — E119 Type 2 diabetes mellitus without complications: Secondary | ICD-10-CM | POA: Diagnosis not present

## 2018-04-08 HISTORY — PX: REVERSE SHOULDER ARTHROPLASTY: SHX5054

## 2018-04-08 LAB — GLUCOSE, CAPILLARY
Glucose-Capillary: 150 mg/dL — ABNORMAL HIGH (ref 70–99)
Glucose-Capillary: 133 mg/dL — ABNORMAL HIGH (ref 70–99)
Glucose-Capillary: 139 mg/dL — ABNORMAL HIGH (ref 70–99)
Glucose-Capillary: 162 mg/dL — ABNORMAL HIGH (ref 70–99)
Glucose-Capillary: 272 mg/dL — ABNORMAL HIGH (ref 70–99)

## 2018-04-08 SURGERY — ARTHROPLASTY, SHOULDER, TOTAL, REVERSE
Anesthesia: General | Laterality: Left

## 2018-04-08 MED ORDER — MIDAZOLAM HCL 2 MG/2ML IJ SOLN
INTRAMUSCULAR | Status: AC
  Filled 2018-04-08: qty 2

## 2018-04-08 MED ORDER — HYDROMORPHONE HCL 1 MG/ML IJ SOLN: 0.5000 mg | INTRAMUSCULAR | Status: DC | PRN

## 2018-04-08 MED ORDER — DARIFENACIN HYDROBROMIDE ER 7.5 MG PO TB24
7.5000 mg | ORAL_TABLET | Freq: Every day | ORAL | Status: DC
  Administered 2018-04-08 – 2018-04-09 (×2): 7.5 mg via ORAL
  Filled 2018-04-08 (×2): qty 1

## 2018-04-08 MED ORDER — GABAPENTIN 300 MG PO CAPS
600.0000 mg | ORAL_CAPSULE | Freq: Two times a day (BID) | ORAL | Status: DC
  Administered 2018-04-08 – 2018-04-09 (×2): 600 mg via ORAL
  Filled 2018-04-08 (×3): qty 2

## 2018-04-08 MED ORDER — VANCOMYCIN HCL 1000 MG IV SOLR
INTRAVENOUS | Status: AC
  Filled 2018-04-08: qty 1000

## 2018-04-08 MED ORDER — ONDANSETRON HCL 4 MG/2ML IJ SOLN: 4.0000 mg | Freq: Four times a day (QID) | INTRAMUSCULAR | Status: DC | PRN

## 2018-04-08 MED ORDER — INSULIN ASPART 100 UNIT/ML ~~LOC~~ SOLN
0.0000 [IU] | Freq: Three times a day (TID) | SUBCUTANEOUS | Status: DC
  Administered 2018-04-08 – 2018-04-09 (×2): 3 [IU] via SUBCUTANEOUS
  Administered 2018-04-09: 2 [IU] via SUBCUTANEOUS

## 2018-04-08 MED ORDER — BUPIVACAINE HCL (PF) 0.25 % IJ SOLN
INTRAMUSCULAR | Status: AC
  Filled 2018-04-08: qty 30

## 2018-04-08 MED ORDER — DEXAMETHASONE SODIUM PHOSPHATE 10 MG/ML IJ SOLN
INTRAMUSCULAR | Status: DC | PRN
  Administered 2018-04-08: 10 mg via INTRAVENOUS

## 2018-04-08 MED ORDER — CEFAZOLIN SODIUM-DEXTROSE 2-4 GM/100ML-% IV SOLN
INTRAVENOUS | Status: AC
  Filled 2018-04-08: qty 100

## 2018-04-08 MED ORDER — 0.9 % SODIUM CHLORIDE (POUR BTL) OPTIME
TOPICAL | Status: DC | PRN
  Administered 2018-04-08: 1000 mL

## 2018-04-08 MED ORDER — ROCURONIUM BROMIDE 50 MG/5ML IV SOSY
PREFILLED_SYRINGE | INTRAVENOUS | Status: DC | PRN
  Administered 2018-04-08: 50 mg via INTRAVENOUS
  Administered 2018-04-08: 20 mg via INTRAVENOUS

## 2018-04-08 MED ORDER — FENTANYL CITRATE (PF) 250 MCG/5ML IJ SOLN
INTRAMUSCULAR | Status: AC
  Filled 2018-04-08: qty 5

## 2018-04-08 MED ORDER — ONDANSETRON HCL 4 MG/2ML IJ SOLN: 4.0000 mg | Freq: Once | INTRAMUSCULAR | Status: DC | PRN

## 2018-04-08 MED ORDER — PHENYLEPHRINE 40 MCG/ML (10ML) SYRINGE FOR IV PUSH (FOR BLOOD PRESSURE SUPPORT)
PREFILLED_SYRINGE | INTRAVENOUS | Status: AC
  Filled 2018-04-08: qty 20

## 2018-04-08 MED ORDER — OXYCODONE HCL 5 MG PO TABS
5.0000 mg | ORAL_TABLET | ORAL | Status: DC | PRN
  Administered 2018-04-08 – 2018-04-09 (×3): 10 mg via ORAL
  Filled 2018-04-08 (×4): qty 2

## 2018-04-08 MED ORDER — CITALOPRAM HYDROBROMIDE 20 MG PO TABS
20.0000 mg | ORAL_TABLET | Freq: Every day | ORAL | Status: DC
  Administered 2018-04-08: 20 mg via ORAL
  Filled 2018-04-08: qty 1

## 2018-04-08 MED ORDER — PROPOFOL 10 MG/ML IV BOLUS
INTRAVENOUS | Status: AC
  Filled 2018-04-08: qty 20

## 2018-04-08 MED ORDER — TRANEXAMIC ACID-NACL 1000-0.7 MG/100ML-% IV SOLN
1000.0000 mg | INTRAVENOUS | Status: AC
  Administered 2018-04-08: 1000 mg via INTRAVENOUS

## 2018-04-08 MED ORDER — INSULIN ASPART 100 UNIT/ML ~~LOC~~ SOLN
0.0000 [IU] | Freq: Every day | SUBCUTANEOUS | Status: DC
  Administered 2018-04-08: 3 [IU] via SUBCUTANEOUS

## 2018-04-08 MED ORDER — PROPOFOL 10 MG/ML IV BOLUS
INTRAVENOUS | Status: DC | PRN
  Administered 2018-04-08: 200 mg via INTRAVENOUS

## 2018-04-08 MED ORDER — DOCUSATE SODIUM 100 MG PO CAPS
100.0000 mg | ORAL_CAPSULE | Freq: Two times a day (BID) | ORAL | Status: DC
  Administered 2018-04-08 – 2018-04-09 (×2): 100 mg via ORAL
  Filled 2018-04-08 (×3): qty 1

## 2018-04-08 MED ORDER — METOCLOPRAMIDE HCL 5 MG/ML IJ SOLN: 5.0000 mg | Freq: Three times a day (TID) | INTRAMUSCULAR | Status: DC | PRN

## 2018-04-08 MED ORDER — BUPIVACAINE-EPINEPHRINE 0.25% -1:200000 IJ SOLN
INTRAMUSCULAR | Status: DC | PRN
  Administered 2018-04-08: 10 mL

## 2018-04-08 MED ORDER — PHENYLEPHRINE HCL 10 MG/ML IJ SOLN
INTRAMUSCULAR | Status: DC | PRN
  Administered 2018-04-08 (×3): 80 ug via INTRAVENOUS

## 2018-04-08 MED ORDER — BUPIVACAINE LIPOSOME 1.3 % IJ SUSP
INTRAMUSCULAR | Status: DC | PRN
  Administered 2018-04-08: 10 mL

## 2018-04-08 MED ORDER — FENTANYL CITRATE (PF) 100 MCG/2ML IJ SOLN
INTRAMUSCULAR | Status: AC
  Administered 2018-04-08: 50 ug via INTRAVENOUS
  Filled 2018-04-08: qty 2

## 2018-04-08 MED ORDER — ASPIRIN EC 81 MG PO TBEC
81.0000 mg | DELAYED_RELEASE_TABLET | ORAL | Status: DC
  Administered 2018-04-09: 81 mg via ORAL
  Filled 2018-04-08: qty 1

## 2018-04-08 MED ORDER — EPINEPHRINE PF 1 MG/ML IJ SOLN
INTRAMUSCULAR | Status: AC
  Filled 2018-04-08: qty 1

## 2018-04-08 MED ORDER — LISINOPRIL 10 MG PO TABS
10.0000 mg | ORAL_TABLET | Freq: Every morning | ORAL | Status: DC
  Administered 2018-04-09: 10 mg via ORAL
  Filled 2018-04-08 (×2): qty 1

## 2018-04-08 MED ORDER — MENTHOL 3 MG MT LOZG: 1.0000 | LOZENGE | OROMUCOSAL | Status: DC | PRN

## 2018-04-08 MED ORDER — METFORMIN HCL 500 MG PO TABS
1000.0000 mg | ORAL_TABLET | Freq: Two times a day (BID) | ORAL | Status: DC
  Administered 2018-04-08 – 2018-04-09 (×2): 1000 mg via ORAL
  Filled 2018-04-08 (×2): qty 2

## 2018-04-08 MED ORDER — OXYCODONE HCL 5 MG PO TABS: 5.0000 mg | ORAL_TABLET | Freq: Once | ORAL | Status: DC | PRN

## 2018-04-08 MED ORDER — LIDOCAINE 2% (20 MG/ML) 5 ML SYRINGE
INTRAMUSCULAR | Status: AC
  Filled 2018-04-08: qty 5

## 2018-04-08 MED ORDER — EPHEDRINE SULFATE 50 MG/ML IJ SOLN
INTRAMUSCULAR | Status: DC | PRN
  Administered 2018-04-08: 10 mg via INTRAVENOUS

## 2018-04-08 MED ORDER — FENTANYL CITRATE (PF) 100 MCG/2ML IJ SOLN
50.0000 ug | Freq: Once | INTRAMUSCULAR | Status: AC
  Administered 2018-04-08: 50 ug via INTRAVENOUS

## 2018-04-08 MED ORDER — OXYCODONE HCL 5 MG PO TABS
10.0000 mg | ORAL_TABLET | ORAL | Status: DC | PRN
  Administered 2018-04-09: 10 mg via ORAL

## 2018-04-08 MED ORDER — LABETALOL HCL 5 MG/ML IV SOLN
INTRAVENOUS | Status: AC
  Filled 2018-04-08: qty 4

## 2018-04-08 MED ORDER — PRAVASTATIN SODIUM 40 MG PO TABS
80.0000 mg | ORAL_TABLET | Freq: Every day | ORAL | Status: DC
  Administered 2018-04-08: 80 mg via ORAL
  Filled 2018-04-08: qty 2

## 2018-04-08 MED ORDER — KETOROLAC TROMETHAMINE 30 MG/ML IJ SOLN
INTRAMUSCULAR | Status: AC
  Filled 2018-04-08: qty 1

## 2018-04-08 MED ORDER — ONDANSETRON HCL 4 MG/2ML IJ SOLN
INTRAMUSCULAR | Status: DC | PRN
  Administered 2018-04-08: 4 mg via INTRAVENOUS

## 2018-04-08 MED ORDER — ACETAMINOPHEN 10 MG/ML IV SOLN: 1000.0000 mg | Freq: Once | INTRAVENOUS | Status: DC

## 2018-04-08 MED ORDER — FENTANYL CITRATE (PF) 100 MCG/2ML IJ SOLN
INTRAMUSCULAR | Status: DC | PRN
  Administered 2018-04-08 (×3): 50 ug via INTRAVENOUS
  Administered 2018-04-08: 100 ug via INTRAVENOUS

## 2018-04-08 MED ORDER — VANCOMYCIN HCL 1000 MG IV SOLR
INTRAVENOUS | Status: DC | PRN
  Administered 2018-04-08: 1000 mg via TOPICAL

## 2018-04-08 MED ORDER — CEFAZOLIN SODIUM-DEXTROSE 2-4 GM/100ML-% IV SOLN
2.0000 g | Freq: Four times a day (QID) | INTRAVENOUS | Status: AC
  Administered 2018-04-08 – 2018-04-09 (×3): 2 g via INTRAVENOUS
  Filled 2018-04-08 (×3): qty 100

## 2018-04-08 MED ORDER — MIDAZOLAM HCL 2 MG/2ML IJ SOLN
2.0000 mg | Freq: Once | INTRAMUSCULAR | Status: AC
  Administered 2018-04-08: 2 mg via INTRAVENOUS

## 2018-04-08 MED ORDER — MIDAZOLAM HCL 2 MG/2ML IJ SOLN
INTRAMUSCULAR | Status: AC
  Administered 2018-04-08: 2 mg via INTRAVENOUS
  Filled 2018-04-08: qty 2

## 2018-04-08 MED ORDER — TRAZODONE HCL 50 MG PO TABS
50.0000 mg | ORAL_TABLET | Freq: Every day | ORAL | Status: DC
  Administered 2018-04-08: 50 mg via ORAL
  Filled 2018-04-08: qty 1

## 2018-04-08 MED ORDER — MOMETASONE FURO-FORMOTEROL FUM 100-5 MCG/ACT IN AERO
2.0000 | INHALATION_SPRAY | Freq: Two times a day (BID) | RESPIRATORY_TRACT | Status: DC
  Administered 2018-04-08 – 2018-04-09 (×2): 2 via RESPIRATORY_TRACT
  Filled 2018-04-08: qty 8.8

## 2018-04-08 MED ORDER — KETOROLAC TROMETHAMINE 30 MG/ML IJ SOLN
30.0000 mg | Freq: Once | INTRAMUSCULAR | Status: AC
  Administered 2018-04-08: 30 mg via INTRAVENOUS

## 2018-04-08 MED ORDER — SUGAMMADEX SODIUM 200 MG/2ML IV SOLN
INTRAVENOUS | Status: DC | PRN
  Administered 2018-04-08: 200 mg via INTRAVENOUS

## 2018-04-08 MED ORDER — LACTATED RINGERS IV SOLN
INTRAVENOUS | Status: DC
  Administered 2018-04-08 (×2): via INTRAVENOUS

## 2018-04-08 MED ORDER — PHENOL 1.4 % MT LIQD: 1.0000 | OROMUCOSAL | Status: DC | PRN

## 2018-04-08 MED ORDER — OXYCODONE HCL 5 MG/5ML PO SOLN: 5.0000 mg | Freq: Once | ORAL | Status: DC | PRN

## 2018-04-08 MED ORDER — PANTOPRAZOLE SODIUM 40 MG PO TBEC
40.0000 mg | DELAYED_RELEASE_TABLET | Freq: Every day | ORAL | Status: DC
  Administered 2018-04-08 – 2018-04-09 (×2): 40 mg via ORAL
  Filled 2018-04-08 (×2): qty 1

## 2018-04-08 MED ORDER — ALBUTEROL SULFATE (2.5 MG/3ML) 0.083% IN NEBU: 3.0000 mL | INHALATION_SOLUTION | RESPIRATORY_TRACT | Status: DC | PRN

## 2018-04-08 MED ORDER — LIDOCAINE 2% (20 MG/ML) 5 ML SYRINGE
INTRAMUSCULAR | Status: DC | PRN
  Administered 2018-04-08: 100 mg via INTRAVENOUS

## 2018-04-08 MED ORDER — CHLORHEXIDINE GLUCONATE 4 % EX LIQD: 60.0000 mL | Freq: Once | CUTANEOUS | Status: DC

## 2018-04-08 MED ORDER — BUPIVACAINE HCL (PF) 0.5 % IJ SOLN
INTRAMUSCULAR | Status: DC | PRN
  Administered 2018-04-08: 15 mL via PERINEURAL

## 2018-04-08 MED ORDER — ONDANSETRON HCL 4 MG PO TABS: 4.0000 mg | ORAL_TABLET | Freq: Four times a day (QID) | ORAL | Status: DC | PRN

## 2018-04-08 MED ORDER — TRANEXAMIC ACID-NACL 1000-0.7 MG/100ML-% IV SOLN
INTRAVENOUS | Status: AC
  Filled 2018-04-08: qty 100

## 2018-04-08 MED ORDER — CEFAZOLIN SODIUM-DEXTROSE 2-4 GM/100ML-% IV SOLN
2.0000 g | INTRAVENOUS | Status: AC
  Administered 2018-04-08: 2 g via INTRAVENOUS

## 2018-04-08 MED ORDER — METOCLOPRAMIDE HCL 5 MG PO TABS: 5.0000 mg | ORAL_TABLET | Freq: Three times a day (TID) | ORAL | Status: DC | PRN

## 2018-04-08 MED ORDER — ACETAMINOPHEN 500 MG PO TABS
1000.0000 mg | ORAL_TABLET | Freq: Four times a day (QID) | ORAL | Status: AC
  Administered 2018-04-08 – 2018-04-09 (×4): 1000 mg via ORAL
  Filled 2018-04-08 (×4): qty 2

## 2018-04-08 MED ORDER — ACETAMINOPHEN 325 MG PO TABS: 325.0000 mg | ORAL_TABLET | Freq: Four times a day (QID) | ORAL | Status: DC | PRN

## 2018-04-08 MED ORDER — TRANEXAMIC ACID-NACL 1000-0.7 MG/100ML-% IV SOLN
1000.0000 mg | Freq: Once | INTRAVENOUS | Status: AC
  Administered 2018-04-08: 1000 mg via INTRAVENOUS
  Filled 2018-04-08: qty 100

## 2018-04-08 SURGICAL SUPPLY — 74 items
AID PSTN UNV HD RSTRNT DISP (MISCELLANEOUS) ×1
ALCOHOL 70% 16 OZ (MISCELLANEOUS) ×3 IMPLANT
BASEPLATE GLENOSPHERE 25 (Plate) ×1 IMPLANT
BASEPLATE GLENOSPHERE 25MM (Plate) ×1 IMPLANT
BEARING HUMERAL 40 STD VITE (Joint) ×2 IMPLANT
BIT DRILL 5/64X5 DISP (BIT) ×3 IMPLANT
BIT DRILL TWIST 2.7 (BIT) ×1 IMPLANT
BIT DRILL TWIST 2.7MM (BIT) ×1
BLADE SAG 18X100X1.27 (BLADE) ×3 IMPLANT
BRNG HUM STD 40 RVRS SHLDR (Joint) ×1 IMPLANT
CLOSURE WOUND 1/2 X4 (GAUZE/BANDAGES/DRESSINGS) ×1
COVER SURGICAL LIGHT HANDLE (MISCELLANEOUS) ×3 IMPLANT
COVER WAND RF STERILE (DRAPES) ×3 IMPLANT
DRAPE IMP U-DRAPE 54X76 (DRAPES) ×6 IMPLANT
DRAPE INCISE IOBAN 66X45 STRL (DRAPES) ×3 IMPLANT
DRAPE ORTHO SPLIT 77X108 STRL (DRAPES) ×6
DRAPE SURG 17X23 STRL (DRAPES) ×3 IMPLANT
DRAPE SURG ORHT 6 SPLT 77X108 (DRAPES) ×2 IMPLANT
DRAPE U-SHAPE 47X51 STRL (DRAPES) ×3 IMPLANT
DRSG AQUACEL AG ADV 3.5X10 (GAUZE/BANDAGES/DRESSINGS) ×3 IMPLANT
DURAPREP 26ML APPLICATOR (WOUND CARE) ×3 IMPLANT
ELECT BLADE 4.0 EZ CLEAN MEGAD (MISCELLANEOUS) ×3
ELECT REM PT RETURN 9FT ADLT (ELECTROSURGICAL) ×3
ELECTRODE BLDE 4.0 EZ CLN MEGD (MISCELLANEOUS) ×1 IMPLANT
ELECTRODE REM PT RTRN 9FT ADLT (ELECTROSURGICAL) ×1 IMPLANT
GLENOSPHERE VERSADIAL 40 +3 (Joint) ×2 IMPLANT
GLOVE BIO SURGEON STRL SZ7.5 (GLOVE) ×3 IMPLANT
GLOVE BIOGEL PI IND STRL 8 (GLOVE) ×1 IMPLANT
GLOVE BIOGEL PI INDICATOR 8 (GLOVE) ×2
GOWN STRL REUS W/ TWL LRG LVL3 (GOWN DISPOSABLE) ×1 IMPLANT
GOWN STRL REUS W/ TWL XL LVL3 (GOWN DISPOSABLE) ×1 IMPLANT
GOWN STRL REUS W/TWL LRG LVL3 (GOWN DISPOSABLE) ×3
GOWN STRL REUS W/TWL XL LVL3 (GOWN DISPOSABLE) ×3
KIT BASIN OR (CUSTOM PROCEDURE TRAY) ×3 IMPLANT
KIT TURNOVER KIT B (KITS) ×3 IMPLANT
MANIFOLD NEPTUNE II (INSTRUMENTS) ×3 IMPLANT
NDL 1/2 CIR MAYO (NEEDLE) ×1 IMPLANT
NDL HYPO 25GX1X1/2 BEV (NEEDLE) ×1 IMPLANT
NEEDLE 1/2 CIR MAYO (NEEDLE) ×3 IMPLANT
NEEDLE HYPO 25GX1X1/2 BEV (NEEDLE) ×3 IMPLANT
NS IRRIG 1000ML POUR BTL (IV SOLUTION) ×3 IMPLANT
PACK SHOULDER (CUSTOM PROCEDURE TRAY) ×3 IMPLANT
PAD ARMBOARD 7.5X6 YLW CONV (MISCELLANEOUS) ×6 IMPLANT
PIN HUMERAL STMN 3.2MMX9IN (INSTRUMENTS) ×2 IMPLANT
PIN STEINMANN THREADED TIP (PIN) ×4 IMPLANT
RESTRAINT HEAD UNIVERSAL NS (MISCELLANEOUS) ×3 IMPLANT
SCREW BONE CORT 6.5X35MM (Screw) ×2 IMPLANT
SCREW LOCKING 4.75MMX15MM (Screw) ×2 IMPLANT
SCREW LOCKING NS 4.75MMX20MM (Screw) ×4 IMPLANT
SCREW LOCKING STRL 4.75X25X3.5 (Screw) ×2 IMPLANT
SLING ARM IMMOBILIZER LRG (SOFTGOODS) ×2 IMPLANT
SLING ARM IMMOBILIZER MED (SOFTGOODS) IMPLANT
SPONGE LAP 18X18 X RAY DECT (DISPOSABLE) IMPLANT
SPONGE LAP 4X18 RFD (DISPOSABLE) ×3 IMPLANT
STEM HUMERAL STRL 11MMX83MM (Stem) ×2 IMPLANT
STRIP CLOSURE SKIN 1/2X4 (GAUZE/BANDAGES/DRESSINGS) ×2 IMPLANT
SUCTION FRAZIER HANDLE 10FR (MISCELLANEOUS) ×2
SUCTION TUBE FRAZIER 10FR DISP (MISCELLANEOUS) ×1 IMPLANT
SUT BROADBAND TAPE 2PK 1.5 (SUTURE) ×2 IMPLANT
SUT FIBERWIRE #2 38 T-5 BLUE (SUTURE) ×3
SUT MAXBRAID (SUTURE) ×2 IMPLANT
SUT MNCRL AB 4-0 PS2 18 (SUTURE) ×3 IMPLANT
SUT VIC AB 1 CT1 27 (SUTURE)
SUT VIC AB 1 CT1 27XBRD ANBCTR (SUTURE) IMPLANT
SUT VIC AB 2-0 CT1 27 (SUTURE) ×3
SUT VIC AB 2-0 CT1 TAPERPNT 27 (SUTURE) ×1 IMPLANT
SUTURE FIBERWR #2 38 T-5 BLUE (SUTURE) ×1 IMPLANT
SYR CONTROL 10ML LL (SYRINGE) ×3 IMPLANT
TOWEL OR 17X24 6PK STRL BLUE (TOWEL DISPOSABLE) ×3 IMPLANT
TOWEL OR 17X26 10 PK STRL BLUE (TOWEL DISPOSABLE) ×3 IMPLANT
TOWER CARTRIDGE SMART MIX (DISPOSABLE) IMPLANT
TRAY HUM MINI SHOULDER +0 40D (Shoulder) ×2 IMPLANT
WATER STERILE IRR 1000ML POUR (IV SOLUTION) ×3 IMPLANT
YANKAUER SUCT BULB TIP NO VENT (SUCTIONS) ×3 IMPLANT

## 2018-04-08 NOTE — Transfer of Care (Signed)
Immediate Anesthesia Transfer of Care Note  Patient: Michael Horn  Procedure(s) Performed: REVERSE SHOULDER ARTHROPLASTY (Left )  Patient Location: PACU  Anesthesia Type:General  Level of Consciousness: awake, alert  and oriented  Airway & Oxygen Therapy: Patient Spontanous Breathing and Patient connected to face mask oxygen  Post-op Assessment: Report given to RN and Post -op Vital signs reviewed and stable  Post vital signs: Reviewed and stable  Last Vitals:  Vitals Value Taken Time  BP 138/106 04/08/2018  3:35 PM  Temp    Pulse 78 04/08/2018  3:37 PM  Resp 20 04/08/2018  3:37 PM  SpO2 98 % 04/08/2018  3:37 PM  Vitals shown include unvalidated device data.  Last Pain:  Vitals:   04/08/18 1250  TempSrc:   PainSc: 0-No pain         Complications: No apparent anesthesia complications

## 2018-04-08 NOTE — Op Note (Signed)
04/08/2018  3:10 PM  PATIENT:  Michael Horn    PRE-OPERATIVE DIAGNOSIS:  joint derangements of left shoulder  POST-OPERATIVE DIAGNOSIS:  Same  PROCEDURE:  REVERSE SHOULDER ARTHROPLASTY  SURGEON:  Nicholes Stairs, MD  ASSISTANT: April Green, RNFA  ANESTHESIA:   General  ESTIMATED BLOOD LOSS: 50 cc  PREOPERATIVE INDICATIONS:  Michael Horn is a  68 y.o. male with a diagnosis of joint derangements of left shoulder who failed conservative measures and elected for surgical management.    The risks benefits and alternatives were discussed with the patient preoperatively including but not limited to the risks of infection, bleeding, nerve injury, cardiopulmonary complications, the need for revision surgery, dislocation, brachial plexus palsy, incomplete relief of pain, among others, and the patient was willing to proceed.  OPERATIVE IMPLANTS: Biomet size 11 mini humeral stem press-fit standard with a 40 mm reverse shoulder arthroplasty tray with a +0 standard liner and a 40+3 mm glenosphere with a mini baseplate and 4 locking screws and one central nonlocking screw.  OPERATIVE FINDINGS:  This previously operated on left shoulder had multiple suture anchors in the humeral head.  We did remove for peek push lock anchors from the humeral head after the head cut.  The supraspinatus tendon was very thin and attenuated along the insertion to the humerus as well as along the myotendinous junction.  There were loose bodies in the joint.  There was grade III chondromalacia on the humeral head and grade 0 changes on the glenoid side.  He had preoperative limitations in range of motion to include 10 degrees of external rotation.  Postoperatively we were able to externally rotate to 40 degrees.  OPERATIVE PROCEDURE: The patient was brought to the operating room and placed in the supine position. General anesthesia was administered. IV antibiotics were given.   TXA was given.  A Foley was not  placed. Time out was performed. The upper extremity was prepped and draped in usual sterile fashion. The patient was in a beachchair position.     Deltopectoral approach was carried out.  The biceps was noted to be previously tenotomized.  It was located just in the subpectoralis region.  This was left in that location and was not separately tenodesed.  The subscapularis was released off of the bone, and a subscapularis peel manner and this was tagged with a heavy suture for later repair.  We did coagulate the anterior 3 sisters while performing the peel..   I then performed circumferential releases of the humerus, and then dislocated the head, and then reamed with the reamer to the above named size.  I then applied the jig, and cut the humeral head in 30 of retroversion, and then turned my attention to the glenoid.  Deep retractors were placed, and I resected the labrum, and then placed a guidepin into the center position on the glenoid, with slight inferior inclination. I then reamed over the guidepin, and this created a small metaphyseal cancellus blush inferiorly, removing just the cartilage to the subchondral bone superiorly. The base plate was selected and impacted place, and then I secured it centrally with a nonlocking screw, and I had excellent purchase both inferiorly and superiorly. I placed a short locking screws on anterior and posterior aspects.  I then turned my attention to the glenosphere, and impacted this into place, placing slight inferior offset (set on B).   The glenoid sphere was completely seated, and had engagement of the [facility] taper. I then turned my  attention back to the humerus.  I sequentially broached, and then trialed, and was found to restore soft tissue tension, and it had 2 finger tightness. Therefore the above named components were selected. The shoulder felt stable throughout functional motion.   I then impacted the real prosthesis into place, as well as the  real humeral tray, and reduced the shoulder. The shoulder had excellent motion, and was stable, and I irrigated the wounds copiously.    I then moved to perform a soft tissue repair of the subscapularis.  I placed 2 figure-of-eight's in a side to side fashion from subscapularis back to the biceps groove.  I then placed 2 figure-of-eight sutures with permanent number 2 kn braid into the lateral aspect of the rotator interval.  I then irrigated the shoulder copiously once more, repaired the deltopectoral interval with Vicryl followed by subcutaneous Vicryl with Steri-Strips and sterile gauze for the skin. The patient was awakened and returned back in stable and satisfactory condition. There no complications and He tolerated the procedure well.  Disposition: Michael Horn will be admitted to the inpatient service.  He will perform routine postoperative reverse shoulder arthroplasty occupational therapy.  Once his pain is adequately controlled we will discharge him home.  He will follow-up with me in 2 weeks for routine wound check.  He will take daily aspirin for DVT prophylaxis.

## 2018-04-08 NOTE — H&P (Signed)
ORTHOPAEDIC H and P  REQUESTING PHYSICIAN: Nicholes Stairs, MD  PCP:  Mayra Neer, MD  Chief Complaint: Left shoulder osteoarthritis  HPI: Michael Horn is a 68 y.o. male who complains of persistent left shoulder pain despite conservative management to include intra-articular and subacromial injections as well as oral medications.  Does have a history of left shoulder rotator cuff repair.  He was referred to me following failure of the above conservative treatments.  He presents today for definitive management including a reverse shoulder arthroplasty.  He has no new complaints at this time.  He has been cleared by his cardiologist for today's procedure.  Past Medical History:  Diagnosis Date  . Arthritis    Arthritis -DDD."chronic pain med used"  . Bladder tumor    states that it was cancer  . Cancer (HCC)    Bladder cancer"-Dr.Ottelin- checks every 3 months--no signs last checks.  . Chronic knee pain RIGHT KNEE --  S/P KNEE REPLACEMENT JAN 2012--  PT STATES  NEEDS ANOTHER REPLACEMENT DUE TO JOINT RECALL  . Complication of anesthesia EMERGENCE DELIRIUM   occurred x1- 2 yrs ago.  Marland Kitchen COPD (chronic obstructive pulmonary disease) ([place])   . Coronary artery disease CARDIOLOGIST- DR Irish Lack  . Depression   . Diabetes mellitus   . GERD (gastroesophageal reflux disease)   . H/O hiatal hernia   . Headache   . History of bladder cancer TCC OF BLADDER  --  FOLLOWED BY DR Karsten Ro  . History of peptic ulcer AGE 77  . Hypertension    pt denies  . Mild obstructive sleep apnea    per study 09-26-2007--  no cpap rx  . Nocturia   . Peripheral neuropathy BOTTOM RIGHT FOOT  . Seasonal allergies   . Short of breath on exertion   . Urgency of urination    Past Surgical History:  Procedure Laterality Date  . BACK SURGERY  X2  . BILATERAL KNEE ARTHROSCOPY    . BILATERAL SHOULDER SURG.  2003  . CARDIAC CATHETERIZATION  04-13-2007   ---- DR Irish Lack   NO SIGNIFICANT CAD/ NORMAL  LVF  . CARPAL TUNNEL RELEASE  04-11-2006   RIGHT  . COLONOSCOPY WITH PROPOFOL N/A 05/01/2015   Procedure: COLONOSCOPY WITH PROPOFOL;  Surgeon: Garlan Fair, MD;  Location: [facility];  Service: Endoscopy;  Laterality: N/A;  . LEFT CARPAL. TUNNEL RELEASE    . LEFT EYE SURG.   2008   REMOVAL OF BB  . REPAIR RECURRENT LEFT ROTATOR CUFF TEAR  01-02-11  . SIGMOID COLECTOMY  09-29-2001   DIVERTICULITIS  . TOTAL KNEE ARTHROPLASTY  04-03-10   RIGHT  . TRANSURETHRAL RESECTION OF BLADDER TUMOR  07-06-10  . TRANSURETHRAL RESECTION OF BLADDER TUMOR  05/20/2011   Procedure: TRANSURETHRAL RESECTION OF BLADDER TUMOR (TURBT);  Surgeon: Claybon Jabs, MD;  Location: [facility];  Service: Urology;  Laterality: N/A;  GYRUS INSTILL MYTOMYCIN C NO BED  . TRANSURETHRAL RESECTION OF BLADDER TUMOR  01/31/2012   Procedure: TRANSURETHRAL RESECTION OF BLADDER TUMOR (TURBT);  Surgeon: Claybon Jabs, MD;  Location[facility];  Service: Urology;  Laterality: N/A;  . TRANSURETHRAL RESECTION OF BLADDER TUMOR WITH GYRUS (TURBT-GYRUS) N/A 11/22/2013   Procedure: Bladder biopsy with fulgeration;  Surgeon: Claybon Jabs, MD;  Location: [facility];  Service: Urology;  Laterality: N/A;   Social History   Socioeconomic History  . Marital status: Married    Spouse name: Not on file  .  Number of children: Not on file  . Years of education: Not on file  . Highest education level: Not on file  Occupational History  . Occupation: retired    Comment: [employer]  Social Needs  . Financial resource strain: Not on file  . Food insecurity:    Worry: Not on file    Inability: Not on file  . Transportation needs:    Medical: Not on file    Non-medical: Not on file  Tobacco Use  . Smoking status: Current Every Day Smoker    Packs/day: 1.50    Years: 50.00    Pack years: 75.00    Types: Cigarettes  . Smokeless tobacco: Never Used  . Tobacco comment:  04/01/2018--attempting to quit, has chantix prescription  Substance and Sexual Activity  . Alcohol use: No    Alcohol/week: 0.0 standard drinks  . Drug use: No  . Sexual activity: Not on file  Lifestyle  . Physical activity:    Days per week: Not on file    Minutes per session: Not on file  . Stress: Not on file  Relationships  . Social connections:    Talks on phone: Not on file    Gets together: Not on file    Attends religious service: Not on file    Active member of club or organization: Not on file    Attends meetings of clubs or organizations: Not on file    Relationship status: Not on file  Other Topics Concern  . Not on file  Social History Narrative  . Not on file   Family History  Problem Relation Age of Onset  . Heart attack Mother    Allergies  Allergen Reactions  . Contrast Media [Iodinated Diagnostic Agents] Rash   Prior to Admission medications   Medication Sig Start Date End Date Taking? Authorizing Provider  albuterol (PROAIR HFA) 108 (90 Base) MCG/ACT inhaler Inhale 2 puffs into the lungs every 4 (four) hours as needed for wheezing or shortness of breath.   Yes [provider]  aspirin EC 81 MG tablet Take 81 mg by mouth every other day.   Yes [provider]  citalopram (CELEXA) 20 MG tablet Take 20 mg by mouth at bedtime.    Yes [provider]  esomeprazole (NEXIUM) 20 MG capsule Take 40 mg by mouth daily at 12 noon.   Yes [provider]  gabapentin (NEURONTIN) 300 MG capsule Take 600 mg by mouth 2 (two) times daily.   Yes [provider]  insulin aspart protamine- aspart (NOVOLOG MIX 70/30) (70-30) 100 UNIT/ML injection Inject 30 Units into the skin 2 (two) times daily.    Yes [provider]  lisinopril (PRINIVIL,ZESTRIL) 10 MG tablet Take 10 mg by mouth every morning.   Yes [provider]  metFORMIN (GLUCOPHAGE) 1000 MG tablet Take 1,000 mg by mouth 2 (two) times daily.   Yes [provider]  Oxycodone HCl 10 MG TABS Take 10 mg by mouth every 6 (six) hours as needed (pain).    Yes [provider]  pravastatin (PRAVACHOL) 40 MG tablet Take 80 mg by mouth at bedtime.    Yes [provider]  traZODone (DESYREL) 50 MG tablet Take 50 mg by mouth at bedtime.   Yes [provider]  trospium (SANCTURA) 20 MG tablet Take 20 mg by mouth 2 (two) times daily.   Yes [provider]  budesonide-formoterol (SYMBICORT) 80-4.5 MCG/ACT inhaler Inhale 2 puffs into the  lungs 2 (two) times daily.    [provider]  fenofibrate 160 MG tablet Take 1 tablet (160 mg total) by mouth daily. Patient not taking: Reported on 04/01/2018 05/05/13   Jettie Booze, MD   No results found.  Positive ROS: All other systems have been reviewed and were otherwise negative with the exception of those mentioned in the HPI and as above.  Physical Exam: General: Alert, no acute distress Cardiovascular: No pedal edema Respiratory: No cyanosis, no use of accessory musculature GI: No organomegaly, abdomen is soft and non-tender Skin: No lesions in the area of chief complaint Neurologic: Sensation intact distally Psychiatric: Patient is competent for consent with normal mood and affect Lymphatic: No axillary or cervical lymphadenopathy    Assessment: Left shoulder rotator cuff arthropathy  Plan: -The plan will be to proceed today with left shoulder reverse replacement.  We have previously reviewed the risk and benefits of this procedure and solicited all questions and he has provided informed consent to proceed. - The risks, benefits, and alternatives were discussed with the patient. There are risks associated with the surgery including, but not limited to, problems with anesthesia (death), infection, differences in leg length/angulation/rotation, fracture of bones, loosening or failure of implants, malunion, nonunion, hematoma (blood accumulation) which  may require surgical drainage, blood clots, pulmonary embolism, nerve injury (foot drop), and blood vessel injury. The patient understands these risks and elects to proceed.  -He will be admitted postoperatively to the inpatient service for routine postoperative total shoulder care.  He will be placed on sliding scale insulin while in the hospital and discharged home on his home medications.  Anticipate he should be able to discharge home tomorrow.    Nicholes Stairs, MD Cell [phone]    04/08/2018 11:58 AM

## 2018-04-08 NOTE — Brief Op Note (Signed)
04/08/2018  3:09 PM  PATIENT:  Michael Horn  68 y.o. male  PRE-OPERATIVE DIAGNOSIS:  joint derangements of left shoulder  POST-OPERATIVE DIAGNOSIS:  joint derangements of left shoulder  PROCEDURE:  Procedure(s) with comments: REVERSE SHOULDER ARTHROPLASTY (Left) - 2.5 hours  SURGEON:  Surgeon(s) and Role:    * Nicholes Stairs, MD - Primary  PHYSICIAN ASSISTANT:   ASSISTANTS: April Green, RNFA   ANESTHESIA:   regional and general  EBL:  50 mL   BLOOD ADMINISTERED:none  DRAINS: none   LOCAL MEDICATIONS USED:  NONE  SPECIMEN:  No Specimen  DISPOSITION OF SPECIMEN:  N/A  COUNTS:  YES  TOURNIQUET:  * No tourniquets in log *  DICTATION: .Note written in EPIC  PLAN OF CARE: Admit to inpatient   PATIENT DISPOSITION:  PACU - hemodynamically stable.   Delay start of Pharmacological VTE agent (>24hrs) due to surgical blood loss or risk of bleeding: not applicable

## 2018-04-08 NOTE — Discharge Instructions (Signed)
Post Op Shoulder replacement instructions:  - no weight bearing to the left arm - maintain sling for comfort, but may remove to do daily exercise as instructed by occupational therapy - sleep in your sling until your follow up appointment with Dr. Stann Mainland. - follow up with Dr. Stann Mainland in 2 weeks - maintain your post op bandage until your follow up appointment  - ok to shower with this bandage in place but do not submerge under water. - apply ice to the left shoulder 20-30 minutes at a time for each hour you are awake

## 2018-04-08 NOTE — Plan of Care (Signed)
  Problem: Education: Goal: Knowledge of General Education information will improve Description: Including pain rating scale, medication(s)/side effects and non-pharmacologic comfort measures Outcome: Progressing   Problem: Health Behavior/Discharge Planning: Goal: Ability to manage health-related needs will improve Outcome: Progressing   Problem: Clinical Measurements: Goal: Will remain free from infection Outcome: Progressing   

## 2018-04-08 NOTE — Anesthesia Procedure Notes (Addendum)
Procedure Name: Intubation Date/Time: 04/08/2018 1:30 PM Performed by: Scheryl Darter, CRNA Pre-anesthesia Checklist: Patient identified, Emergency Drugs available, Suction available and Patient being monitored Patient Re-evaluated:Patient Re-evaluated prior to induction Oxygen Delivery Method: Circle System Utilized Preoxygenation: Pre-oxygenation with 100% oxygen Induction Type: IV induction Ventilation: Mask ventilation without difficulty Laryngoscope Size: Mac and 4 Grade View: Grade II Tube type: Oral Tube size: 7.5 mm Number of attempts: 1 Airway Equipment and Method: Stylet and Oral airway Placement Confirmation: ETT inserted through vocal cords under direct vision,  positive ETCO2 and breath sounds checked- equal and bilateral Secured at: 23 cm Tube secured with: Tape Dental Injury: Teeth and Oropharynx as per pre-operative assessment

## 2018-04-08 NOTE — Anesthesia Procedure Notes (Signed)
Anesthesia Regional Block: Interscalene brachial plexus block   Pre-Anesthetic Checklist: ,, timeout performed, Correct Patient, Correct Site, Correct Laterality, Correct Procedure, Correct Position, site marked, Risks and benefits discussed,  Surgical consent,  Pre-op evaluation,  At surgeon's request and post-op pain management  Laterality: Left  Prep: chloraprep       Needles:  Injection technique: Single-shot  Needle Type: Echogenic Stimulator Needle     Needle Length: 9cm  Needle Gauge: 21     Additional Needles:   Procedures:,,,, ultrasound used (permanent image in chart),,,,  Narrative:  Start time: 04/08/2018 12:40 PM End time: 04/08/2018 12:44 PM Injection made incrementally with aspirations every 5 mL.  Performed by: Personally  Anesthesiologist: Lidia Collum, MD  Additional Notes: Monitors applied. Injection made in 5cc increments. No resistance to injection. Good needle visualization. Patient tolerated procedure well.

## 2018-04-09 ENCOUNTER — Encounter (HOSPITAL_COMMUNITY): Payer: Self-pay | Admitting: Orthopedic Surgery

## 2018-04-09 LAB — CBC
HCT: 33.4 % — ABNORMAL LOW (ref 39.0–52.0)
Hemoglobin: 10.4 g/dL — ABNORMAL LOW (ref 13.0–17.0)
MCH: 22.7 pg — AB (ref 26.0–34.0)
MCHC: 31.1 g/dL (ref 30.0–36.0)
MCV: 72.8 fL — ABNORMAL LOW (ref 80.0–100.0)
Platelets: 247 10*3/uL (ref 150–400)
RBC: 4.59 MIL/uL (ref 4.22–5.81)
RDW: 15.2 % (ref 11.5–15.5)
WBC: 13 10*3/uL — ABNORMAL HIGH (ref 4.0–10.5)
nRBC: 0 % (ref 0.0–0.2)

## 2018-04-09 LAB — BASIC METABOLIC PANEL
Anion gap: 10 (ref 5–15)
BUN: 18 mg/dL (ref 8–23)
CO2: 23 mmol/L (ref 22–32)
Calcium: 8.8 mg/dL — ABNORMAL LOW (ref 8.9–10.3)
Chloride: 104 mmol/L (ref 98–111)
Creatinine, Ser: 0.96 mg/dL (ref 0.61–1.24)
GFR calc Af Amer: >60 mL/min (ref >60)
GFR calc non Af Amer: >60 mL/min (ref >60)
Glucose, Bld: 211 mg/dL — ABNORMAL HIGH (ref 70–99)
Potassium: 4.1 mmol/L (ref 3.5–5.1)
Sodium: 137 mmol/L (ref 135–145)

## 2018-04-09 LAB — GLUCOSE, CAPILLARY
Glucose-Capillary: 142 mg/dL — ABNORMAL HIGH (ref 70–99)
Glucose-Capillary: 154 mg/dL — ABNORMAL HIGH (ref 70–99)

## 2018-04-09 MED ORDER — ONDANSETRON 4 MG PO TBDP: 4.0000 mg | ORAL_TABLET | Freq: Three times a day (TID) | ORAL | 0 refills | Status: DC | PRN

## 2018-04-09 MED ORDER — METHOCARBAMOL 500 MG PO TABS: 500.0000 mg | ORAL_TABLET | Freq: Four times a day (QID) | ORAL | 0 refills | Status: DC | PRN

## 2018-04-09 MED ORDER — OXYCODONE HCL 10 MG PO TABS: 10.0000 mg | ORAL_TABLET | Freq: Four times a day (QID) | ORAL | 0 refills | Status: DC | PRN

## 2018-04-09 NOTE — Progress Notes (Signed)
Occupational Therapy Evaluation Patient Details Name: Michael Horn MRN: [id number] DOB: 18-Jul-1950 Today's Date: 04/09/2018    History of Present Illness Pt is a 68 y.o. male presents to the hospital with complaints of persistent left shoulder pain. PMH of  Arthritis, Bladder tumor/Cancer, Chronic knee pain, DM, COPD, and CAD   Clinical Impression   PTA pt PLOF Mod I with the use of AE in home setting. Pt lives at home with the assistance of his wife. Pt currently requires Mod A for UB dressing/ Bathing, Feeding, and ADLs at sink/bed level due to limitation of shoulder protocol and precautions. Pt/caregiver educated of protocol and HEP with verbal instructions and handout provided. Pt demonstrated Min guard LB dressing and Mod A UB dressing with VCs for sequencing/ safety to limited ROM of LUE shoulder. Pt D/C from OT provided with protocol instructions and answered pt questions and concerned. OT will sign off.     Follow Up Recommendations  No OT follow up    Equipment Recommendations  None recommended by OT    Recommendations for Other Services       Precautions / Restrictions Precautions Precautions: Shoulder Type of Shoulder Precautions: NWB Shoulder Interventions: Shoulder sling/immobilizer;Off for dressing/bathing/exercises Precaution Booklet Issued: Yes (comment) Required Braces or Orthoses: Sling Restrictions Weight Bearing Restrictions: Yes LUE Weight Bearing: Non weight bearing      Mobility Bed Mobility Overal bed mobility: Modified Independent(HOB elevated)                Transfers Overall transfer level: Independent Equipment used: None             General transfer comment: no assistance required for functional transfers    Balance Overall balance assessment: Independent                                         ADL either performed or assessed with clinical judgement   ADL Overall ADL's : Needs  assistance/impaired Eating/Feeding: Set up   Grooming: Wash/dry hands;Wash/dry face;Oral care;Set up;Min guard Grooming Details (indicate cue type and reason): Pt will require reminders to keep arm immobilization to prevent ROM of shoulder. Upper Body Bathing: Moderate assistance;Cueing for sequencing;Sitting   Lower Body Bathing: Min guard   Upper Body Dressing : Moderate assistance Upper Body Dressing Details (indicate cue type and reason): Pt educated on don/doff of over head and button up shirt.  Lower Body Dressing: Min guard   Toilet Transfer: Supervision/safety   Toileting- Clothing Manipulation and Hygiene: Min guard       Functional mobility during ADLs: Supervision/safety General ADL Comments: Pt demonstrates good understanding of shoulder protocol. Wife present during session, also educated on techniques and protocols.      Vision Baseline Vision/History: Wears glasses       Perception     Praxis      Pertinent Vitals/Pain Pain Assessment: Faces Faces Pain Scale: Hurts a little bit Pain Location: LUE Pain Intervention(s): Repositioned;Monitored during session     Hand Dominance Right   Extremity/Trunk Assessment Upper Extremity Assessment Upper Extremity Assessment: LUE deficits/detail LUE: Unable to fully assess due to immobilization LUE Coordination: decreased gross motor           Communication Communication Communication: No difficulties   Cognition Arousal/Alertness: Awake/alert Behavior During Therapy: WFL for tasks assessed/performed Overall Cognitive Status: Within Functional Limits for tasks assessed  General Comments       Exercises Exercises: Shoulder Shoulder Exercises Elbow Flexion: AROM;10 reps Elbow Extension: AROM;10 reps Wrist Flexion: AROM;10 reps Wrist Extension: AROM;10 reps Digit Composite Flexion: AROM;10 reps Composite Extension: AROM;10 reps   Shoulder  Instructions Shoulder Instructions Donning/doffing shirt without moving shoulder: Moderate assistance Donning/doffing sling/immobilizer: Moderate assistance ROM for elbow, wrist and digits of operated UE: Minimal assistance Sling wearing schedule (on at all times/off for ADL's): Minimal assistance Positioning of UE while sleeping: Minimal assistance    Home Living Family/patient expects to be discharged to:: Private residence Living Arrangements: Spouse/significant other Available Help at Discharge: Family;Friend(s) Type of Home: Mobile home Home Access: Stairs to enter [employer] of Steps: 3 Entrance Stairs-Rails: Can reach both Home Layout: One level     Bathroom Shower/Tub: Occupational psychologist: Standard Bathroom Accessibility: No   Home Equipment: Grab bars - tub/shower;Shower seat - built in;Walker - 2 wheels;Cane - single point          Prior Functioning/Environment Level of Independence: Independent with assistive device(s)        Comments: Pt required assistance with LB dressing.         OT Problem List: Decreased strength;Decreased range of motion;Decreased safety awareness;Decreased knowledge of precautions      OT Treatment/Interventions:      OT Goals(Current goals can be found in the care plan section) Acute Rehab OT Goals Patient Stated Goal: To return home OT Goal Formulation: With patient Time For Goal Achievement: 04/09/18 Potential to Achieve Goals: Good  OT Frequency:     Barriers to D/C:            Co-evaluation              AM-PAC OT "6 Clicks" Daily Activity     Outcome Measure Help from another person eating meals?: A Little Help from another person taking care of personal grooming?: A Little Help from another person toileting, which includes using toliet, bedpan, or urinal?: A Little Help from another person bathing (including washing, rinsing, drying)?: A Little Help from another person to put on and  taking off regular upper body clothing?: A Lot Help from another person to put on and taking off regular lower body clothing?: A Little 6 Click Score: 17   End of Session Nurse Communication: Mobility status;Weight bearing status;Precautions  Activity Tolerance: Patient tolerated treatment well Patient left: in bed;with call bell/phone within reach;with family/visitor present  OT Visit Diagnosis: Other (comment)                Time: 5916-3846 OT Time Calculation (min): 22 min Charges:  OT General Charges $OT Visit: 1 Visit OT Evaluation $OT Eval Moderate Complexity: [place], MSOT, OTR/L  Supplemental Rehabilitation Services  [phone]  Marius Ditch 04/09/2018, 2:35 PM

## 2018-04-09 NOTE — Progress Notes (Signed)
   Subjective:  Patient reports pain as mild.  Had a good night last night.  His nerve block is still pretty dense but his sensation has returned in his hand and wrist.  No motor yet.  He denies shortness of breath or nausea or vomiting or chest pain.  He feels ready to go home.  Objective:   VITALS:   Vitals:   04/09/18 0008 04/09/18 0424 04/09/18 0944 04/09/18 1039  BP: (!) 149/84 121/79  129/71  Pulse: 84 73  81  Resp: 20 20    Temp: 98.6 F (37 C) 98.3 F (36.8 C)  98.3 F (36.8 C)  TempSrc: Oral Oral  Oral  SpO2: 94% 95% 95% 95%  Weight:      Height:        Incision: dressing C/D/I Scant drainage noted on the dressing but this has not expanded since PACU yesterday. Sensation intact in the median and ulnar nerve.  Decreased in the radial nerve.  Out in the axillary nerve.  Motor intact at median, radial, ulnar, AIN and PIN.  No axillary yet.  No muscular cutaneous yet. 2+ radial pulse.  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  That is reasonable.  We did discuss that he may have a rebound pain when the block wears off we discussed to go ahead and starting his pain medication and icing pretty aggressively to anticipate that.  -Okay to use left arm for  activities of daily living.  Otherwise maintain sling other than when doing exercises.  -Patient will follow-up with me in 2 weeks for routine wound check. -Daily aspirin for 6 weeks for DVT prophylaxis.   Nicholes Stairs 04/09/2018, 10:56 AM   Geralynn Rile, MD [phone]

## 2018-04-09 NOTE — Progress Notes (Signed)
Discharge instructions reviewed with pt and his wife. Copy of instructions given to pt, scripts were sent in electronically to pt's pharmacy by MD, pt informed.   Pt d/c'd via wheelchair with belongings, with wife.           Escorted by unit NT.

## 2018-04-09 NOTE — Anesthesia Postprocedure Evaluation (Signed)
Anesthesia Post Note  Patient: Michael Horn  Procedure(s) Performed: REVERSE SHOULDER ARTHROPLASTY (Left )     Patient location during evaluation: PACU Anesthesia Type: General Level of consciousness: awake and alert Pain management: pain level controlled Vital Signs Assessment: post-procedure vital signs reviewed and stable Respiratory status: spontaneous breathing, nonlabored ventilation and respiratory function stable Cardiovascular status: blood pressure returned to baseline and stable Postop Assessment: no apparent nausea or vomiting Anesthetic complications: no    Last Vitals:  Vitals:   04/09/18 0944 04/09/18 1039  BP:  129/71  Pulse:  81  Resp:    Temp:  36.8 C  SpO2: 95% 95%    Last Pain:  Vitals:   04/09/18 1538  TempSrc:   PainSc: 8    Pain Goal: Patients Stated Pain Goal: 2 (04/09/18 1538)                 Lidia Collum

## 2018-04-13 NOTE — Discharge Summary (Signed)
Patient ID: Michael Horn MRN: [id number] DOB/AGE: 07/01/1950 68 y.o.  Admit date: 04/08/2018 Discharge date: 04/09/2018  Primary Diagnosis: Right proximal humerus fracture  Admission Diagnoses:  Past Medical History:  Diagnosis Date  . Arthritis    Arthritis -DDD."chronic pain med used"  . Bladder tumor    states that it was cancer  . Cancer (HCC)    Bladder cancer"-Dr.Ottelin- checks every 3 months--no signs last checks.  . Chronic knee pain RIGHT KNEE --  S/P KNEE REPLACEMENT JAN 2012--  PT STATES  NEEDS ANOTHER REPLACEMENT DUE TO JOINT RECALL  . Complication of anesthesia EMERGENCE DELIRIUM   occurred x1- 2 yrs ago.  Mar[place] Kitchen COPD (chronic obstructive pulmonary disease) ([place])   . Coronary artery disease CARDIOLOGIST- DR Irish Lack  . Depression   . Diabetes mellitus   . GERD (gastroesophageal reflux disease)   . H/O hiatal hernia   . Headache   . History of bladder cancer TCC OF BLADDER  --  FOLLOWED BY DR Karsten Ro  . History of peptic ulcer AGE 18  . Hypertension    pt denies  . Mild obstructive sleep apnea    per study 09-26-2007--  no cpap rx  . Nocturia   . Peripheral neuropathy BOTTOM RIGHT FOOT  . Seasonal allergies   . Short of breath on exertion   . 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   Weight as of this encounter: 110.7 kg.  Procedure:  Procedure(s) (LRB): REVERSE SHOULDER ARTHROPLASTY (Left)   Consults: None  HPI: Michael Horn presented for Reverse arthroplasty of  Left shoulder. Laboratory Data: Admission on 04/08/2018, Discharged on 04/09/2018  Component Date Value Ref Range Status  . Glucose-Capillary 04/08/2018 150* 70 - 99 mg/dL Final  . Glucose-Capillary 04/08/2018 133* 70 - 99  mg/dL Final  . Glucose-Capillary 04/08/2018 139* 70 - 99 mg/dL Final  . Comment 1 04/08/2018 Notify RN   Final  . Comment 2 04/08/2018 Document in Chart   Final  . Glucose-Capillary 04/08/2018 162* 70 - 99 mg/dL Final  . WBC 04/09/2018 13.0* 4.0 - 10.5 K/uL Final  . RBC 04/09/2018 4.59  4.22 - 5.81 MIL/uL Final  . Hemoglobin 04/09/2018 10.4* 13.0 - 17.0 g/dL Final  . HCT 04/09/2018 33.4* 39.0 - 52.0 % Final  . MCV 04/09/2018 72.8* 80.0 - 100.0 fL Final  . MCH 04/09/2018 22.7* 26.0 - 34.0 pg Final  . MCHC 04/09/2018 31.1  30.0 - 36.0 g/dL Final  . RDW 04/09/2018 15.2  11.5 - 15.5 % Final  . Platelets 04/09/2018 247  150 - 400 K/uL Final  . nRBC 04/09/2018 0.0  0.0 - 0.2 % Final   Performed at [facility], [place] [address], [place], [place] [address]  . Sodium 04/09/2018 137  135 - 145 mmol/L Final  . Potassium 04/09/2018 4.1  3.5 - 5.1 mmol/L Final  . Chloride 04/09/2018 104  98 - 111 mmol/L Final  . CO2 04/09/2018 23  22 - 32 mmol/L Final  . Glucose, Bld 04/09/2018 211* 70 - 99 mg/dL Final  . BUN 04/09/2018 18  8 - 23 mg/dL Final  . Creatinine, Ser 04/09/2018 0.96  0.61 - 1.24 mg/dL Final  . Calcium 04/09/2018 8.8* 8.9 -  10.3 mg/dL Final  . GFR calc non Af Amer 04/09/2018 >60  >60 mL/min Final  . GFR calc Af Amer 04/09/2018 >60  >60 mL/min Final  . Anion gap 04/09/2018 10  5 - 15 Final   Performed at [place] [address], [place], [place] [address]  . Glucose-Capillary 04/08/2018 272* 70 - 99 mg/dL Final  . Glucose-Capillary 04/09/2018 142* 70 - 99 mg/dL Final  . Glucose-Capillary 04/09/2018 154* 70 - 99 mg/dL Final  Hospital Outpatient Visit on 04/01/2018  Component Date Value Ref Range Status  . Glucose-Capillary 04/01/2018 161* 70 - 99 mg/dL Final  . MRSA, PCR 04/01/2018 NEGATIVE  NEGATIVE Final  . Staphylococcus aureus 04/01/2018 NEGATIVE  NEGATIVE Final   Comment: (NOTE) The Xpert SA Assay (FDA approved for NASAL specimens in patients 38 years of age and  older), is one component of a comprehensive surveillance program. It is not intended to diagnose infection nor to guide or monitor treatment. Performed at [facility], [place] [address], [place], [place] [address]   . Sodium 04/01/2018 140  135 - 145 mmol/L Final  . Potassium 04/01/2018 4.2  3.5 - 5.1 mmol/L Final  . Chloride 04/01/2018 103  98 - 111 mmol/L Final  . CO2 04/01/2018 28  22 - 32 mmol/L Final  . Glucose, Bld 04/01/2018 136* 70 - 99 mg/dL Final  . BUN 04/01/2018 12  8 - 23 mg/dL Final  . Creatinine, Ser 04/01/2018 0.94  0.61 - 1.24 mg/dL Final  . Calcium 04/01/2018 9.4  8.9 - 10.3 mg/dL Final  . GFR calc non Af Amer 04/01/2018 >60  >60 mL/min Final  . GFR calc Af Amer 04/01/2018 >60  >60 mL/min Final  . Anion gap 04/01/2018 9  5 - 15 Final   Performed at [facility], [place] [address] [place], [place] [address]  . Hgb A1c MFr Bld 04/01/2018 7.2* 4.8 - 5.6 % Final   Comment: (NOTE) Pre diabetes:          5.7%-6.4% Diabetes:              >6.4% Glycemic control for   <7.0% adults with diabetes   . Mean Plasma Glucose 04/01/2018 159.94  mg/dL Final   Performed at [facility], [place] [address] [place], [place] [address]  . WBC 04/01/2018 9.5  4.0 - 10.5 K/uL Final  . RBC 04/01/2018 5.49  4.22 - 5.81 MIL/uL Final  . Hemoglobin 04/01/2018 12.3* 13.0 - 17.0 g/dL Final  . HCT 04/01/2018 40.6  39.0 - 52.0 % Final  . MCV 04/01/2018 74.0* 80.0 - 100.0 fL Final  . MCH 04/01/2018 22.4* 26.0 - 34.0 pg Final  . MCHC 04/01/2018 30.3  30.0 - 36.0 g/dL Final  . RDW 04/01/2018 15.0  11.5 - 15.5 % Final  . Platelets 04/01/2018 294  150 - 400 K/uL Final  . nRBC 04/01/2018 0.0  0.0 - 0.2 % Final   Performed at [facility], [place] [address], [place], [place] [address]     X-Rays:Dg Shoulder Left Port  Result Date: 04/08/2018 CLINICAL DATA:  Status post left shoulder arthroplasty EXAM: LEFT SHOULDER - 1 VIEW COMPARISON:  None. FINDINGS: Left shoulder replacement is  noted. No acute bony or soft tissue abnormality is seen. IMPRESSION: Status post left shoulder replacement Electronically Signed   By: Inez Catalina M.D.   On: 04/08/2018 16:16    EKG: Orders placed or performed during the hospital encounter  of 05/04/17  . ED EKG  . ED EKG  . EKG 12-Lead  . EKG 12-Lead  . EKG     Hospital Course: Michael Horn is a 68 y.o. who was admitted to Hospital. They were brought to the operating room on 04/08/2018 and underwent Procedure(s): [place].  Patient tolerated the procedure well and was later transferred to the recovery room and then to the orthopaedic floor for postoperative care.  They were given PO and IV analgesics for pain control following their surgery.  They were given 24 hours of postoperative antibiotics of  Anti-infectives (From admission, onward)   Start     Dose/Rate Route Frequency Ordered Stop   04/08/18 1645  ceFAZolin (ANCEF) IVPB 2g/100 mL premix     2 g 200 mL/hr over 30 Minutes Intravenous Every 6 hours 04/08/18 1632 04/09/18 0457   04/08/18 1503  vancomycin (VANCOCIN) powder  Status:  Discontinued       As needed 04/08/18 1511 04/08/18 1529   04/08/18 1200  ceFAZolin (ANCEF) IVPB 2g/100 mL premix     2 g 200 mL/hr over 30 Minutes Intravenous On call to O.R. 04/08/18 1156 04/08/18 1341   04/08/18 1158  ceFAZolin (ANCEF) 2-4 GM/100ML-% IVPB    Note to Pharmacy:  Tamsen Snider   : cabinet override      04/08/18 1158 04/08/18 1341     and started on DVT prophylaxis in the form of Aspirin.   PT and OT were ordered for total joint protocol.  Discharge planning consulted to help with postop disposition and equipment needs.  Patient had a Good night on the evening of surgery.   He had adequate pain control and denied any shortness of breath or nausea or vomiting.  Postoperative labs were stable.  He elected to discharge home. Patient was seen in rounds and was ready to go home.   Diet: Regular  diet Activity:NWB Follow-up:in 2 weeks Disposition - Home Discharged Condition: good   Discharge Instructions    Call MD / Call 911   Complete by:  As directed    If you experience chest pain or shortness of breath, CALL 911 and be transported to the hospital emergency room.  If you develope a fever above 101 F, pus (white drainage) or increased drainage or redness at the wound, or calf pain, call your surgeon's office.   Constipation Prevention   Complete by:  As directed    Drink plenty of fluids.  Prune juice may be helpful.  You may use a stool softener, such as Colace (over the counter) 100 mg twice a day.  Use MiraLax (over the counter) for constipation as needed.   Diet - low sodium heart healthy   Complete by:  As directed    Increase activity slowly as tolerated   Complete by:  As directed      Allergies as of 04/09/2018      Reactions   Contrast Media [iodinated Diagnostic Agents] Rash      Medication List    TAKE these medications   aspirin EC 81 MG tablet Take 81 mg by mouth every other day.   budesonide-formoterol 80-4.5 MCG/ACT inhaler Commonly known as:  SYMBICORT Inhale 2 puffs into the lungs 2 (two) times daily.   citalopram 20 MG tablet Commonly known as:  CELEXA Take 20 mg by mouth at bedtime.   esomeprazole 20 MG capsule Commonly known as:  NEXIUM Take 40 mg by mouth daily at 12 noon.  fenofibrate 160 MG tablet Take 1 tablet (160 mg total) by mouth daily.   gabapentin 300 MG capsule Commonly known as:  NEURONTIN Take 600 mg by mouth 2 (two) times daily.   insulin aspart protamine- aspart (70-30) 100 UNIT/ML injection Commonly known as:  NOVOLOG MIX 70/30 Inject 30 Units into the skin 2 (two) times daily.   lisinopril 10 MG tablet Commonly known as:  PRINIVIL,ZESTRIL Take 10 mg by mouth every morning.   metFORMIN 1000 MG tablet Commonly known as:  GLUCOPHAGE Take 1,000 mg by mouth 2 (two) times daily.   methocarbamol 500 MG  tablet Commonly known as:  ROBAXIN Take 1 tablet (500 mg total) by mouth every 6 (six) hours as needed for muscle spasms.   ondansetron 4 MG disintegrating tablet Commonly known as:  ZOFRAN ODT Take 1 tablet (4 mg total) by mouth every 8 (eight) hours as needed.   Oxycodone HCl 10 MG Tabs Take 1-2 tablets (10-20 mg total) by mouth every 6 (six) hours as needed for up to 60 doses (severe pain). What changed:    how much to take  reasons to take this Notes to patient:  Last dose received at 0853am today (Thursday) 04/09/2018   pravastatin 40 MG tablet Commonly known as:  PRAVACHOL Take 80 mg by mouth at bedtime.   PROAIR HFA 108 (90 Base) MCG/ACT inhaler Generic drug:  albuterol Inhale 2 puffs into the lungs every 4 (four) hours as needed for wheezing or shortness of breath.   traZODone 50 MG tablet Commonly known as:  DESYREL Take 50 mg by mouth at bedtime.   trospium 20 MG tablet Commonly known as:  SANCTURA Take 20 mg by mouth 2 (two) times daily.      Follow-up Information    Nicholes Stairs, MD. Schedule an appointment as soon as possible for a visit in 2 weeks.   Specialty:  Orthopedic Surgery Why:  For wound re-check Contact information: [address] [place] [address] [phone]           Signed: Geralynn Rile, MD Orthopaedic Surgery 04/13/2018, 5:30 PM

## 2018-04-27 DIAGNOSIS — M25561 Pain in right knee: Secondary | ICD-10-CM | POA: Diagnosis not present

## 2018-04-27 DIAGNOSIS — M47817 Spondylosis without myelopathy or radiculopathy, lumbosacral region: Secondary | ICD-10-CM | POA: Diagnosis not present

## 2018-04-27 DIAGNOSIS — G894 Chronic pain syndrome: Secondary | ICD-10-CM | POA: Diagnosis not present

## 2018-04-27 DIAGNOSIS — M62830 Muscle spasm of back: Secondary | ICD-10-CM | POA: Diagnosis not present

## 2018-04-29 DIAGNOSIS — M25612 Stiffness of left shoulder, not elsewhere classified: Secondary | ICD-10-CM | POA: Diagnosis not present

## 2018-04-29 DIAGNOSIS — M25512 Pain in left shoulder: Secondary | ICD-10-CM | POA: Diagnosis not present

## 2018-04-29 DIAGNOSIS — M25412 Effusion, left shoulder: Secondary | ICD-10-CM | POA: Diagnosis not present

## 2018-05-06 DIAGNOSIS — M25412 Effusion, left shoulder: Secondary | ICD-10-CM | POA: Diagnosis not present

## 2018-05-06 DIAGNOSIS — M25512 Pain in left shoulder: Secondary | ICD-10-CM | POA: Diagnosis not present

## 2018-05-06 DIAGNOSIS — M25612 Stiffness of left shoulder, not elsewhere classified: Secondary | ICD-10-CM | POA: Diagnosis not present

## 2018-05-11 DIAGNOSIS — M25612 Stiffness of left shoulder, not elsewhere classified: Secondary | ICD-10-CM | POA: Diagnosis not present

## 2018-05-11 DIAGNOSIS — M25512 Pain in left shoulder: Secondary | ICD-10-CM | POA: Diagnosis not present

## 2018-05-11 DIAGNOSIS — M25412 Effusion, left shoulder: Secondary | ICD-10-CM | POA: Diagnosis not present

## 2018-05-14 DIAGNOSIS — M25412 Effusion, left shoulder: Secondary | ICD-10-CM | POA: Diagnosis not present

## 2018-05-14 DIAGNOSIS — M25612 Stiffness of left shoulder, not elsewhere classified: Secondary | ICD-10-CM | POA: Diagnosis not present

## 2018-05-14 DIAGNOSIS — M25512 Pain in left shoulder: Secondary | ICD-10-CM | POA: Diagnosis not present

## 2018-05-18 DIAGNOSIS — M25512 Pain in left shoulder: Secondary | ICD-10-CM | POA: Diagnosis not present

## 2018-05-18 DIAGNOSIS — M25412 Effusion, left shoulder: Secondary | ICD-10-CM | POA: Diagnosis not present

## 2018-05-18 DIAGNOSIS — M25612 Stiffness of left shoulder, not elsewhere classified: Secondary | ICD-10-CM | POA: Diagnosis not present

## 2018-05-20 DIAGNOSIS — M25412 Effusion, left shoulder: Secondary | ICD-10-CM | POA: Diagnosis not present

## 2018-05-20 DIAGNOSIS — M25612 Stiffness of left shoulder, not elsewhere classified: Secondary | ICD-10-CM | POA: Diagnosis not present

## 2018-05-20 DIAGNOSIS — M25512 Pain in left shoulder: Secondary | ICD-10-CM | POA: Diagnosis not present

## 2018-05-22 DIAGNOSIS — Z4789 Encounter for other orthopedic aftercare: Secondary | ICD-10-CM | POA: Diagnosis not present

## 2018-05-25 DIAGNOSIS — M25412 Effusion, left shoulder: Secondary | ICD-10-CM | POA: Diagnosis not present

## 2018-05-25 DIAGNOSIS — M25512 Pain in left shoulder: Secondary | ICD-10-CM | POA: Diagnosis not present

## 2018-05-25 DIAGNOSIS — M25612 Stiffness of left shoulder, not elsewhere classified: Secondary | ICD-10-CM | POA: Diagnosis not present

## 2018-05-27 DIAGNOSIS — M25512 Pain in left shoulder: Secondary | ICD-10-CM | POA: Diagnosis not present

## 2018-05-27 DIAGNOSIS — M25612 Stiffness of left shoulder, not elsewhere classified: Secondary | ICD-10-CM | POA: Diagnosis not present

## 2018-05-27 DIAGNOSIS — M25412 Effusion, left shoulder: Secondary | ICD-10-CM | POA: Diagnosis not present

## 2018-06-16 DIAGNOSIS — M25612 Stiffness of left shoulder, not elsewhere classified: Secondary | ICD-10-CM | POA: Diagnosis not present

## 2018-06-16 DIAGNOSIS — M25412 Effusion, left shoulder: Secondary | ICD-10-CM | POA: Diagnosis not present

## 2018-06-16 DIAGNOSIS — M542 Cervicalgia: Secondary | ICD-10-CM | POA: Diagnosis not present

## 2018-06-16 DIAGNOSIS — M9903 Segmental and somatic dysfunction of lumbar region: Secondary | ICD-10-CM | POA: Diagnosis not present

## 2018-06-16 DIAGNOSIS — M5412 Radiculopathy, cervical region: Secondary | ICD-10-CM | POA: Diagnosis not present

## 2018-06-16 DIAGNOSIS — M25512 Pain in left shoulder: Secondary | ICD-10-CM | POA: Diagnosis not present

## 2018-06-16 DIAGNOSIS — M9901 Segmental and somatic dysfunction of cervical region: Secondary | ICD-10-CM | POA: Diagnosis not present

## 2018-06-16 DIAGNOSIS — I4891 Unspecified atrial fibrillation: Secondary | ICD-10-CM | POA: Diagnosis not present

## 2018-06-22 DIAGNOSIS — M1712 Unilateral primary osteoarthritis, left knee: Secondary | ICD-10-CM | POA: Diagnosis not present

## 2018-06-22 DIAGNOSIS — M47817 Spondylosis without myelopathy or radiculopathy, lumbosacral region: Secondary | ICD-10-CM | POA: Diagnosis not present

## 2018-06-22 DIAGNOSIS — M7551 Bursitis of right shoulder: Secondary | ICD-10-CM | POA: Diagnosis not present

## 2018-06-22 DIAGNOSIS — M19011 Primary osteoarthritis, right shoulder: Secondary | ICD-10-CM | POA: Diagnosis not present

## 2018-06-22 DIAGNOSIS — G894 Chronic pain syndrome: Secondary | ICD-10-CM | POA: Diagnosis not present

## 2018-06-22 DIAGNOSIS — M47812 Spondylosis without myelopathy or radiculopathy, cervical region: Secondary | ICD-10-CM | POA: Diagnosis not present

## 2018-06-22 DIAGNOSIS — Z79891 Long term (current) use of opiate analgesic: Secondary | ICD-10-CM | POA: Diagnosis not present

## 2018-06-22 DIAGNOSIS — M62830 Muscle spasm of back: Secondary | ICD-10-CM | POA: Diagnosis not present

## 2018-06-22 DIAGNOSIS — E1140 Type 2 diabetes mellitus with diabetic neuropathy, unspecified: Secondary | ICD-10-CM | POA: Diagnosis not present

## 2018-06-22 DIAGNOSIS — M25561 Pain in right knee: Secondary | ICD-10-CM | POA: Diagnosis not present

## 2018-06-25 DIAGNOSIS — M25512 Pain in left shoulder: Secondary | ICD-10-CM | POA: Diagnosis not present

## 2018-06-25 DIAGNOSIS — M25412 Effusion, left shoulder: Secondary | ICD-10-CM | POA: Diagnosis not present

## 2018-06-25 DIAGNOSIS — M25612 Stiffness of left shoulder, not elsewhere classified: Secondary | ICD-10-CM | POA: Diagnosis not present

## 2018-06-30 DIAGNOSIS — M25412 Effusion, left shoulder: Secondary | ICD-10-CM | POA: Diagnosis not present

## 2018-06-30 DIAGNOSIS — M25612 Stiffness of left shoulder, not elsewhere classified: Secondary | ICD-10-CM | POA: Diagnosis not present

## 2018-06-30 DIAGNOSIS — M25512 Pain in left shoulder: Secondary | ICD-10-CM | POA: Diagnosis not present

## 2018-07-06 DIAGNOSIS — M25612 Stiffness of left shoulder, not elsewhere classified: Secondary | ICD-10-CM | POA: Diagnosis not present

## 2018-07-06 DIAGNOSIS — M25512 Pain in left shoulder: Secondary | ICD-10-CM | POA: Diagnosis not present

## 2018-07-06 DIAGNOSIS — M25412 Effusion, left shoulder: Secondary | ICD-10-CM | POA: Diagnosis not present

## 2018-07-08 DIAGNOSIS — M19012 Primary osteoarthritis, left shoulder: Secondary | ICD-10-CM | POA: Diagnosis not present

## 2018-07-10 DIAGNOSIS — M65322 Trigger finger, left index finger: Secondary | ICD-10-CM | POA: Diagnosis not present

## 2018-07-10 DIAGNOSIS — M1712 Unilateral primary osteoarthritis, left knee: Secondary | ICD-10-CM | POA: Diagnosis not present

## 2018-07-13 DIAGNOSIS — M25512 Pain in left shoulder: Secondary | ICD-10-CM | POA: Diagnosis not present

## 2018-07-13 DIAGNOSIS — M25412 Effusion, left shoulder: Secondary | ICD-10-CM | POA: Diagnosis not present

## 2018-07-13 DIAGNOSIS — M25612 Stiffness of left shoulder, not elsewhere classified: Secondary | ICD-10-CM | POA: Diagnosis not present

## 2018-07-24 DIAGNOSIS — R252 Cramp and spasm: Secondary | ICD-10-CM | POA: Diagnosis not present

## 2018-07-24 DIAGNOSIS — E1159 Type 2 diabetes mellitus with other circulatory complications: Secondary | ICD-10-CM | POA: Diagnosis not present

## 2018-07-24 DIAGNOSIS — Z794 Long term (current) use of insulin: Secondary | ICD-10-CM | POA: Diagnosis not present

## 2018-08-05 DIAGNOSIS — R252 Cramp and spasm: Secondary | ICD-10-CM | POA: Diagnosis not present

## 2018-08-18 DIAGNOSIS — Z794 Long term (current) use of insulin: Secondary | ICD-10-CM | POA: Diagnosis not present

## 2018-08-18 DIAGNOSIS — Z5181 Encounter for therapeutic drug level monitoring: Secondary | ICD-10-CM | POA: Diagnosis not present

## 2018-08-18 DIAGNOSIS — E669 Obesity, unspecified: Secondary | ICD-10-CM | POA: Diagnosis not present

## 2018-08-18 DIAGNOSIS — Z720 Tobacco use: Secondary | ICD-10-CM | POA: Diagnosis not present

## 2018-08-18 DIAGNOSIS — E1142 Type 2 diabetes mellitus with diabetic polyneuropathy: Secondary | ICD-10-CM | POA: Diagnosis not present

## 2018-08-20 DIAGNOSIS — M47817 Spondylosis without myelopathy or radiculopathy, lumbosacral region: Secondary | ICD-10-CM | POA: Diagnosis not present

## 2018-08-20 DIAGNOSIS — M47812 Spondylosis without myelopathy or radiculopathy, cervical region: Secondary | ICD-10-CM | POA: Diagnosis not present

## 2018-08-20 DIAGNOSIS — M7551 Bursitis of right shoulder: Secondary | ICD-10-CM | POA: Diagnosis not present

## 2018-08-20 DIAGNOSIS — M62830 Muscle spasm of back: Secondary | ICD-10-CM | POA: Diagnosis not present

## 2018-08-20 DIAGNOSIS — M1712 Unilateral primary osteoarthritis, left knee: Secondary | ICD-10-CM | POA: Diagnosis not present

## 2018-08-20 DIAGNOSIS — G894 Chronic pain syndrome: Secondary | ICD-10-CM | POA: Diagnosis not present

## 2018-08-20 DIAGNOSIS — M25561 Pain in right knee: Secondary | ICD-10-CM | POA: Diagnosis not present

## 2018-08-20 DIAGNOSIS — Z79891 Long term (current) use of opiate analgesic: Secondary | ICD-10-CM | POA: Diagnosis not present

## 2018-08-20 DIAGNOSIS — M19011 Primary osteoarthritis, right shoulder: Secondary | ICD-10-CM | POA: Diagnosis not present

## 2018-08-20 DIAGNOSIS — E1140 Type 2 diabetes mellitus with diabetic neuropathy, unspecified: Secondary | ICD-10-CM | POA: Diagnosis not present

## 2018-09-07 DIAGNOSIS — D649 Anemia, unspecified: Secondary | ICD-10-CM | POA: Diagnosis not present

## 2018-09-17 ENCOUNTER — Other Ambulatory Visit: Payer: Self-pay

## 2018-09-17 ENCOUNTER — Ambulatory Visit
Admission: RE | Admit: 2018-09-17 | Discharge: 2018-09-17 | Disposition: A | Discharge status: Home / Self Care | Payer: PPO | Source: Ambulatory Visit | Attending: Family Medicine | Admitting: Family Medicine

## 2018-09-17 ENCOUNTER — Other Ambulatory Visit: Payer: Self-pay | Admitting: Family Medicine

## 2018-09-17 DIAGNOSIS — R079 Chest pain, unspecified: Secondary | ICD-10-CM

## 2018-09-18 DIAGNOSIS — Z7189 Other specified counseling: Secondary | ICD-10-CM | POA: Diagnosis not present

## 2018-09-18 DIAGNOSIS — R252 Cramp and spasm: Secondary | ICD-10-CM | POA: Diagnosis not present

## 2018-09-18 DIAGNOSIS — J441 Chronic obstructive pulmonary disease with (acute) exacerbation: Secondary | ICD-10-CM | POA: Diagnosis not present

## 2018-10-06 DIAGNOSIS — Z8551 Personal history of malignant neoplasm of bladder: Secondary | ICD-10-CM | POA: Diagnosis not present

## 2018-10-06 DIAGNOSIS — N400 Enlarged prostate without lower urinary tract symptoms: Secondary | ICD-10-CM | POA: Diagnosis not present

## 2018-10-15 DIAGNOSIS — M47817 Spondylosis without myelopathy or radiculopathy, lumbosacral region: Secondary | ICD-10-CM | POA: Diagnosis not present

## 2018-10-15 DIAGNOSIS — G894 Chronic pain syndrome: Secondary | ICD-10-CM | POA: Diagnosis not present

## 2018-10-15 DIAGNOSIS — M62830 Muscle spasm of back: Secondary | ICD-10-CM | POA: Diagnosis not present

## 2018-10-15 DIAGNOSIS — M25561 Pain in right knee: Secondary | ICD-10-CM | POA: Diagnosis not present

## 2018-10-15 DIAGNOSIS — Z79891 Long term (current) use of opiate analgesic: Secondary | ICD-10-CM | POA: Diagnosis not present

## 2018-11-24 DIAGNOSIS — D225 Melanocytic nevi of trunk: Secondary | ICD-10-CM | POA: Diagnosis not present

## 2018-11-24 DIAGNOSIS — L820 Inflamed seborrheic keratosis: Secondary | ICD-10-CM | POA: Diagnosis not present

## 2018-11-27 DIAGNOSIS — K5190 Ulcerative colitis, unspecified, without complications: Secondary | ICD-10-CM

## 2018-12-10 DIAGNOSIS — G894 Chronic pain syndrome: Secondary | ICD-10-CM | POA: Diagnosis not present

## 2018-12-10 DIAGNOSIS — M47817 Spondylosis without myelopathy or radiculopathy, lumbosacral region: Secondary | ICD-10-CM | POA: Diagnosis not present

## 2018-12-10 DIAGNOSIS — M25561 Pain in right knee: Secondary | ICD-10-CM | POA: Diagnosis not present

## 2018-12-10 DIAGNOSIS — M62830 Muscle spasm of back: Secondary | ICD-10-CM | POA: Diagnosis not present

## 2018-12-14 DIAGNOSIS — Z720 Tobacco use: Secondary | ICD-10-CM | POA: Diagnosis not present

## 2018-12-14 DIAGNOSIS — Z5181 Encounter for therapeutic drug level monitoring: Secondary | ICD-10-CM | POA: Diagnosis not present

## 2018-12-14 DIAGNOSIS — Z23 Encounter for immunization: Secondary | ICD-10-CM | POA: Diagnosis not present

## 2018-12-14 DIAGNOSIS — E669 Obesity, unspecified: Secondary | ICD-10-CM | POA: Diagnosis not present

## 2018-12-14 DIAGNOSIS — Z794 Long term (current) use of insulin: Secondary | ICD-10-CM | POA: Diagnosis not present

## 2018-12-14 DIAGNOSIS — E1142 Type 2 diabetes mellitus with diabetic polyneuropathy: Secondary | ICD-10-CM | POA: Diagnosis not present

## 2018-12-14 DIAGNOSIS — E1165 Type 2 diabetes mellitus with hyperglycemia: Secondary | ICD-10-CM | POA: Diagnosis not present

## 2019-01-04 ENCOUNTER — Other Ambulatory Visit: Payer: Self-pay | Admitting: Family Medicine

## 2019-01-04 ENCOUNTER — Ambulatory Visit
Admission: RE | Admit: 2019-01-04 | Discharge: 2019-01-04 | Disposition: A | Discharge status: Home / Self Care | Payer: PPO | Source: Ambulatory Visit | Attending: Family Medicine | Admitting: Family Medicine

## 2019-01-04 DIAGNOSIS — R05 Cough: Secondary | ICD-10-CM | POA: Diagnosis not present

## 2019-01-04 DIAGNOSIS — R059 Cough, unspecified: Secondary | ICD-10-CM

## 2019-02-08 DIAGNOSIS — M62830 Muscle spasm of back: Secondary | ICD-10-CM | POA: Diagnosis not present

## 2019-02-08 DIAGNOSIS — M47817 Spondylosis without myelopathy or radiculopathy, lumbosacral region: Secondary | ICD-10-CM | POA: Diagnosis not present

## 2019-02-08 DIAGNOSIS — G894 Chronic pain syndrome: Secondary | ICD-10-CM | POA: Diagnosis not present

## 2019-02-08 DIAGNOSIS — M25561 Pain in right knee: Secondary | ICD-10-CM | POA: Diagnosis not present

## 2019-02-10 DIAGNOSIS — M1712 Unilateral primary osteoarthritis, left knee: Secondary | ICD-10-CM | POA: Diagnosis not present

## 2019-02-18 DIAGNOSIS — D225 Melanocytic nevi of trunk: Secondary | ICD-10-CM | POA: Diagnosis not present

## 2019-02-18 DIAGNOSIS — L739 Follicular disorder, unspecified: Secondary | ICD-10-CM | POA: Diagnosis not present

## 2019-02-18 DIAGNOSIS — D2239 Melanocytic nevi of other parts of face: Secondary | ICD-10-CM | POA: Diagnosis not present

## 2019-02-18 DIAGNOSIS — L820 Inflamed seborrheic keratosis: Secondary | ICD-10-CM | POA: Diagnosis not present

## 2019-02-22 DIAGNOSIS — E782 Mixed hyperlipidemia: Secondary | ICD-10-CM | POA: Diagnosis not present

## 2019-02-22 DIAGNOSIS — D509 Iron deficiency anemia, unspecified: Secondary | ICD-10-CM | POA: Diagnosis not present

## 2019-02-22 DIAGNOSIS — Z125 Encounter for screening for malignant neoplasm of prostate: Secondary | ICD-10-CM | POA: Diagnosis not present

## 2019-02-22 DIAGNOSIS — I10 Essential (primary) hypertension: Secondary | ICD-10-CM | POA: Diagnosis not present

## 2019-02-24 DIAGNOSIS — I10 Essential (primary) hypertension: Secondary | ICD-10-CM | POA: Diagnosis not present

## 2019-02-24 DIAGNOSIS — K219 Gastro-esophageal reflux disease without esophagitis: Secondary | ICD-10-CM | POA: Diagnosis not present

## 2019-02-24 DIAGNOSIS — E1142 Type 2 diabetes mellitus with diabetic polyneuropathy: Secondary | ICD-10-CM | POA: Diagnosis not present

## 2019-02-24 DIAGNOSIS — I700 Atherosclerosis of aorta: Secondary | ICD-10-CM | POA: Diagnosis not present

## 2019-02-24 DIAGNOSIS — J449 Chronic obstructive pulmonary disease, unspecified: Secondary | ICD-10-CM | POA: Diagnosis not present

## 2019-02-24 DIAGNOSIS — Z0000 Encounter for general adult medical examination without abnormal findings: Secondary | ICD-10-CM | POA: Diagnosis not present

## 2019-02-24 DIAGNOSIS — F322 Major depressive disorder, single episode, severe without psychotic features: Secondary | ICD-10-CM | POA: Diagnosis not present

## 2019-02-24 DIAGNOSIS — Z125 Encounter for screening for malignant neoplasm of prostate: Secondary | ICD-10-CM | POA: Diagnosis not present

## 2019-02-24 DIAGNOSIS — D509 Iron deficiency anemia, unspecified: Secondary | ICD-10-CM | POA: Diagnosis not present

## 2019-02-24 DIAGNOSIS — Z794 Long term (current) use of insulin: Secondary | ICD-10-CM | POA: Diagnosis not present

## 2019-02-24 DIAGNOSIS — E782 Mixed hyperlipidemia: Secondary | ICD-10-CM | POA: Diagnosis not present

## 2019-03-29 DIAGNOSIS — Z794 Long term (current) use of insulin: Secondary | ICD-10-CM | POA: Diagnosis not present

## 2019-03-29 DIAGNOSIS — Z79899 Other long term (current) drug therapy: Secondary | ICD-10-CM | POA: Diagnosis not present

## 2019-03-29 DIAGNOSIS — E1165 Type 2 diabetes mellitus with hyperglycemia: Secondary | ICD-10-CM | POA: Diagnosis not present

## 2019-03-29 DIAGNOSIS — E1142 Type 2 diabetes mellitus with diabetic polyneuropathy: Secondary | ICD-10-CM | POA: Diagnosis not present

## 2019-03-29 DIAGNOSIS — Z23 Encounter for immunization: Secondary | ICD-10-CM | POA: Diagnosis not present

## 2019-03-29 DIAGNOSIS — Z7189 Other specified counseling: Secondary | ICD-10-CM | POA: Diagnosis not present

## 2019-03-29 DIAGNOSIS — Z720 Tobacco use: Secondary | ICD-10-CM | POA: Diagnosis not present

## 2019-03-29 DIAGNOSIS — E669 Obesity, unspecified: Secondary | ICD-10-CM | POA: Diagnosis not present

## 2019-04-05 DIAGNOSIS — G894 Chronic pain syndrome: Secondary | ICD-10-CM | POA: Diagnosis not present

## 2019-04-05 DIAGNOSIS — M25561 Pain in right knee: Secondary | ICD-10-CM | POA: Diagnosis not present

## 2019-04-05 DIAGNOSIS — Z79891 Long term (current) use of opiate analgesic: Secondary | ICD-10-CM | POA: Diagnosis not present

## 2019-04-05 DIAGNOSIS — M47817 Spondylosis without myelopathy or radiculopathy, lumbosacral region: Secondary | ICD-10-CM | POA: Diagnosis not present

## 2019-04-05 DIAGNOSIS — M62830 Muscle spasm of back: Secondary | ICD-10-CM | POA: Diagnosis not present

## 2019-04-30 DIAGNOSIS — Z96612 Presence of left artificial shoulder joint: Secondary | ICD-10-CM | POA: Diagnosis not present

## 2019-04-30 DIAGNOSIS — Z471 Aftercare following joint replacement surgery: Secondary | ICD-10-CM | POA: Diagnosis not present

## 2019-05-20 DIAGNOSIS — Z471 Aftercare following joint replacement surgery: Secondary | ICD-10-CM | POA: Diagnosis not present

## 2019-05-20 DIAGNOSIS — Z96612 Presence of left artificial shoulder joint: Secondary | ICD-10-CM | POA: Diagnosis not present

## 2019-05-31 DIAGNOSIS — M62830 Muscle spasm of back: Secondary | ICD-10-CM | POA: Diagnosis not present

## 2019-05-31 DIAGNOSIS — G894 Chronic pain syndrome: Secondary | ICD-10-CM | POA: Diagnosis not present

## 2019-05-31 DIAGNOSIS — M25561 Pain in right knee: Secondary | ICD-10-CM | POA: Diagnosis not present

## 2019-05-31 DIAGNOSIS — M47817 Spondylosis without myelopathy or radiculopathy, lumbosacral region: Secondary | ICD-10-CM | POA: Diagnosis not present

## 2019-06-17 DIAGNOSIS — M1712 Unilateral primary osteoarthritis, left knee: Secondary | ICD-10-CM | POA: Diagnosis not present

## 2019-07-27 DIAGNOSIS — G894 Chronic pain syndrome: Secondary | ICD-10-CM | POA: Diagnosis not present

## 2019-07-27 DIAGNOSIS — M62830 Muscle spasm of back: Secondary | ICD-10-CM | POA: Diagnosis not present

## 2019-07-27 DIAGNOSIS — M25561 Pain in right knee: Secondary | ICD-10-CM | POA: Diagnosis not present

## 2019-07-27 DIAGNOSIS — M47817 Spondylosis without myelopathy or radiculopathy, lumbosacral region: Secondary | ICD-10-CM | POA: Diagnosis not present

## 2019-08-25 DIAGNOSIS — I10 Essential (primary) hypertension: Secondary | ICD-10-CM | POA: Diagnosis not present

## 2019-08-25 DIAGNOSIS — E782 Mixed hyperlipidemia: Secondary | ICD-10-CM | POA: Diagnosis not present

## 2019-08-25 DIAGNOSIS — I700 Atherosclerosis of aorta: Secondary | ICD-10-CM | POA: Diagnosis not present

## 2019-08-25 DIAGNOSIS — E1159 Type 2 diabetes mellitus with other circulatory complications: Secondary | ICD-10-CM | POA: Diagnosis not present

## 2019-08-25 DIAGNOSIS — F17200 Nicotine dependence, unspecified, uncomplicated: Secondary | ICD-10-CM | POA: Diagnosis not present

## 2019-08-25 DIAGNOSIS — J449 Chronic obstructive pulmonary disease, unspecified: Secondary | ICD-10-CM | POA: Diagnosis not present

## 2019-09-16 DIAGNOSIS — M25562 Pain in left knee: Secondary | ICD-10-CM | POA: Diagnosis not present

## 2019-09-16 DIAGNOSIS — M1712 Unilateral primary osteoarthritis, left knee: Secondary | ICD-10-CM | POA: Diagnosis not present

## 2019-09-21 DIAGNOSIS — M25561 Pain in right knee: Secondary | ICD-10-CM | POA: Diagnosis not present

## 2019-09-21 DIAGNOSIS — G894 Chronic pain syndrome: Secondary | ICD-10-CM | POA: Diagnosis not present

## 2019-09-21 DIAGNOSIS — M62830 Muscle spasm of back: Secondary | ICD-10-CM | POA: Diagnosis not present

## 2019-09-21 DIAGNOSIS — M47817 Spondylosis without myelopathy or radiculopathy, lumbosacral region: Secondary | ICD-10-CM | POA: Diagnosis not present

## 2019-09-27 DIAGNOSIS — E669 Obesity, unspecified: Secondary | ICD-10-CM | POA: Diagnosis not present

## 2019-09-27 DIAGNOSIS — E1142 Type 2 diabetes mellitus with diabetic polyneuropathy: Secondary | ICD-10-CM | POA: Diagnosis not present

## 2019-09-27 DIAGNOSIS — E1165 Type 2 diabetes mellitus with hyperglycemia: Secondary | ICD-10-CM | POA: Diagnosis not present

## 2019-09-27 DIAGNOSIS — Z794 Long term (current) use of insulin: Secondary | ICD-10-CM | POA: Diagnosis not present

## 2019-09-27 DIAGNOSIS — Z720 Tobacco use: Secondary | ICD-10-CM | POA: Diagnosis not present

## 2019-10-12 DIAGNOSIS — M1712 Unilateral primary osteoarthritis, left knee: Secondary | ICD-10-CM | POA: Diagnosis not present

## 2019-10-19 DIAGNOSIS — M1712 Unilateral primary osteoarthritis, left knee: Secondary | ICD-10-CM | POA: Diagnosis not present

## 2019-10-26 DIAGNOSIS — M1712 Unilateral primary osteoarthritis, left knee: Secondary | ICD-10-CM | POA: Diagnosis not present

## 2019-11-16 DIAGNOSIS — M25561 Pain in right knee: Secondary | ICD-10-CM | POA: Diagnosis not present

## 2019-11-16 DIAGNOSIS — M47817 Spondylosis without myelopathy or radiculopathy, lumbosacral region: Secondary | ICD-10-CM | POA: Diagnosis not present

## 2019-11-16 DIAGNOSIS — G894 Chronic pain syndrome: Secondary | ICD-10-CM | POA: Diagnosis not present

## 2019-11-16 DIAGNOSIS — M62830 Muscle spasm of back: Secondary | ICD-10-CM | POA: Diagnosis not present

## 2019-11-17 DIAGNOSIS — R609 Edema, unspecified: Secondary | ICD-10-CM | POA: Diagnosis not present

## 2019-11-17 DIAGNOSIS — Z23 Encounter for immunization: Secondary | ICD-10-CM | POA: Diagnosis not present

## 2019-12-01 DIAGNOSIS — Z8551 Personal history of malignant neoplasm of bladder: Secondary | ICD-10-CM | POA: Diagnosis not present

## 2019-12-07 ENCOUNTER — Other Ambulatory Visit: Payer: Self-pay | Admitting: Urology

## 2019-12-20 ENCOUNTER — Encounter (HOSPITAL_BASED_OUTPATIENT_CLINIC_OR_DEPARTMENT_OTHER): Payer: Self-pay | Admitting: Urology

## 2019-12-20 ENCOUNTER — Other Ambulatory Visit: Payer: Self-pay

## 2019-12-20 ENCOUNTER — Other Ambulatory Visit (HOSPITAL_COMMUNITY)
Admission: RE | Admit: 2019-12-20 | Discharge: 2019-12-20 | Disposition: A | Discharge status: Home / Self Care | Payer: PPO | Source: Ambulatory Visit | Attending: Urology | Admitting: Urology

## 2019-12-20 DIAGNOSIS — Z20822 Contact with and (suspected) exposure to covid-19: Secondary | ICD-10-CM | POA: Diagnosis not present

## 2019-12-20 DIAGNOSIS — Z01812 Encounter for preprocedural laboratory examination: Secondary | ICD-10-CM | POA: Insufficient documentation

## 2019-12-20 LAB — SARS CORONAVIRUS 2 (TAT 6-24 HRS): SARS Coronavirus 2: NEGATIVE

## 2019-12-20 NOTE — Progress Notes (Signed)
Spoke w/ via phone for pre-op interview---pt Lab needs dos----  I stat 8, ekg             COVID test ------12-20-2019 Arrive at -------1130 am 12-23-2019 NPO after MN NO Solid Food.  Clear liquids from MN until---1030 am then npo Medications to take morning of surgery -----albuterol inhaler prn/bring inhaler, pregabylin, trospium esomeprazole Diabetic medication -----take 1/2 dose 70/30 insulin hs night before surgery (take 22 units), no diabetic meds or insulin day of surgery Patient Special Instructions -----none Pre-Op special Istructions -----none Patient verbalized understanding of instructions that were given at this phone interview. Patient denies shortness of breath, chest pain, fever, cough at this phone interview.  Anesthesia Review: no  MMO:[employer] shaw Cardiologist : dr Concepcion Living 07-08-2013 epic Chest x-ray :none EKG :none  Echo :none Stress test:none Cardiac Cath : 2009 (clean cath 2009 per dr Irish Lack 07-08-2013 note epic ) Activity level: normal can climb stairs dob with heavy exertion Sleep Study/ CPAP :mild osa no cpap needed Fasting Blood Sugar :      / Checks Blood Sugar -- times a day:  Blood thinner n/a ASA / Instructions/ Last Dose : stopped 81 mg aspirin 12-13-2019 per dr Diona Fanti instructions

## 2019-12-22 NOTE — H&P (Signed)
H&P  Chief Complaint: Bladder cancer  History of Present Illness: Pt w/ longstanding h/o bladder cancer. He has recently had cysto in my office which revealed 2 areas of recurrent TCCa.   Past Medical History:  Diagnosis Date  . Arthritis    Arthritis -DDD."chronic pain med used"  . Bladder tumor    states that it was cancer  . Cancer (HCC)    Bladder cancer"-Dr.Ottelin- checks every 3 months--no signs last checks.  . Chronic knee pain RIGHT KNEE --  S/P KNEE REPLACEMENT JAN 2012--  PT STATES  NEEDS ANOTHER REPLACEMENT DUE TO JOINT RECALL  . Complication of anesthesia EMERGENCE DELIRIUM   occurred x1- 2 yrs ago.few surgeries ago, none recently  . COPD (chronic obstructive pulmonary disease) ([place])   . Coronary artery disease   . Depression   . DM type 2 (diabetes mellitus, type 2) ([place])   . GERD (gastroesophageal reflux disease)   . H/O hiatal hernia   . Headache   . History of bladder cancer TCC OF BLADDER  --  FOLLOWED BY DR Karsten Ro  . History of peptic ulcer AGE 4  . Hypertension    pt denies, takes lisimopril for kidneys  . Mild obstructive sleep apnea    per study 09-26-2007--  no cpap rx  . Nocturia   . Peripheral neuropathy BOTTOM RIGHT FOOT  . Seasonal allergies   . Short of breath on exertion   . Urgency of urination     Past Surgical History:  Procedure Laterality Date  . BACK SURGERY  X2   lower back  . BILATERAL KNEE ARTHROSCOPY    . BILATERAL SHOULDER SURG.  2003  . CARDIAC CATHETERIZATION  04-13-2007   ---- DR Irish Lack   NO SIGNIFICANT CAD/ NORMAL LVF  . CARPAL TUNNEL RELEASE  04-11-2006   RIGHT  . COLONOSCOPY WITH PROPOFOL N/A 05/01/2015   Procedure: COLONOSCOPY WITH PROPOFOL;  Surgeon: Garlan Fair, MD;  Location: [facility];  Service: Endoscopy;  Laterality: N/A;  . LEFT CARPAL. TUNNEL RELEASE    . LEFT EYE SURG.   2008   REMOVAL OF BB  . REPAIR RECURRENT LEFT ROTATOR CUFF TEAR  01-02-11  . REVERSE SHOULDER ARTHROPLASTY Left 04/08/2018    Procedure: REVERSE SHOULDER ARTHROPLASTY;  Surgeon: Nicholes Stairs, MD;  Location: [facility];  Service: Orthopedics;  Laterality: Left;  2.5 hours  . SIGMOID COLECTOMY  09-29-2001   DIVERTICULITIS  . TOTAL KNEE ARTHROPLASTY  04-03-10   RIGHT  . TRANSURETHRAL RESECTION OF BLADDER TUMOR  07-06-10  . TRANSURETHRAL RESECTION OF BLADDER TUMOR  05/20/2011   Procedure: TRANSURETHRAL RESECTION OF BLADDER TUMOR (TURBT);  Surgeon: Claybon Jabs, MD;  Location: [facility];  Service: Urology;  Laterality: N/A;  GYRUS INSTILL MYTOMYCIN C NO BED  . TRANSURETHRAL RESECTION OF BLADDER TUMOR  01/31/2012   Procedure: TRANSURETHRAL RESECTION OF BLADDER TUMOR (TURBT);  Surgeon: Claybon Jabs, MD;  Location: [facility];  Service: Urology;  Laterality: N/A;  . TRANSURETHRAL RESECTION OF BLADDER TUMOR WITH GYRUS (TURBT-GYRUS) N/A 11/22/2013   Procedure: Bladder biopsy with fulgeration;  Surgeon: Claybon Jabs, MD;  Location[facility];  Service: Urology;  Laterality: N/A;    Home Medications:  Allergies as of 12/22/2019      Reactions   Contrast Media [iodinated Diagnostic Agents] Rash      Medication List    Notice   Cannot display discharge medications because the patient has not yet been admitted.  Allergies:  Allergies  Allergen Reactions  . Contrast Media [Iodinated Diagnostic Agents] Rash    Family History  Problem Relation Age of Onset  . Heart attack Mother     Social History:  reports that he has been smoking cigarettes. He has a 75.00 pack-year smoking history. He has never used smokeless tobacco. He reports that he does not drink alcohol and does not use drugs.  ROS: A complete review of systems was performed.  All systems are negative except for pertinent findings as noted.  Physical Exam:  Vital signs in last 24 hours: Ht 5\' 10"  (1.778 m)   Wt 113.4 kg   BMI 35.87 kg/m  Constitutional:  Alert and oriented, No acute  distress Cardiovascular: Regular rate  Respiratory: Normal respiratory effort GI: Abdomen is soft, nontender, nondistended, no abdominal masses. No CVAT.  Genitourinary: Normal male phallus, testes are descended bilaterally and non-tender and without masses, scrotum is normal in appearance without lesions or masses, perineum is normal on inspection. Lymphatic: No lymphadenopathy Neurologic: Grossly intact, no focal deficits Psychiatric: Normal mood and affect  Laboratory Data:  No results for input(s): WBC, HGB, HCT, PLT in the last 72 hours.  No results for input(s): NA, K, CL, GLUCOSE, BUN, CALCIUM, CREATININE in the last 72 hours.  Invalid input(s): CO3   No results found for this or any previous visit (from the past 24 hour(s)). Recent Results (from the past 240 hour(s))  SARS CORONAVIRUS 2 (TAT 6-24 HRS) Nasopharyngeal Nasopharyngeal Swab     Status: None   Collection Time: 12/20/19 10:50 AM   Specimen: Nasopharyngeal Swab  Result Value Ref Range Status   SARS Coronavirus 2 NEGATIVE NEGATIVE Final    Comment: (NOTE) SARS-CoV-2 target nucleic acids are NOT DETECTED.  The SARS-CoV-2 RNA is generally detectable in upper and lower respiratory specimens during the acute phase of infection. Negative results do not preclude SARS-CoV-2 infection, do not rule out co-infections with other pathogens, and should not be used as the sole basis for treatment or other patient management decisions. Negative results must be combined with clinical observations, patient history, and epidemiological information. The expected result is Negative.  Fact Sheet for Patients: SugarRoll.be  Fact Sheet for Healthcare Providers: https://www.woods-mathews.com/  This test is not yet approved or cleared by the [place] FDA and  has been authorized for detection and/or diagnosis of SARS-CoV-2 by FDA under an Emergency Use Authorization (EUA). This EUA will  remain  in effect (meaning this test can be used) for the duration of the COVID-19 declaration under Se ction 564(b)(1) of the Act, 21 U.S.C. section 360bbb-3(b)(1), unless the authorization is terminated or revoked sooner.  Performed at [facility], [place] [address], [place], [place] [address]     Renal Function: No results for input(s): CREATININE in the last 168 hours. CrCl cannot be calculated (Patient's most recent lab result is older than the maximum 21 days allowed.).  Radiologic Imaging: No results found.  Impression/Assessment:  Recurrent TCCa of bladder  Plan:  Cysto, (B) RGPs, TURBT, gemcitabine

## 2019-12-23 ENCOUNTER — Ambulatory Visit (HOSPITAL_BASED_OUTPATIENT_CLINIC_OR_DEPARTMENT_OTHER): Payer: PPO | Admitting: Certified Registered"

## 2019-12-23 ENCOUNTER — Ambulatory Visit (HOSPITAL_BASED_OUTPATIENT_CLINIC_OR_DEPARTMENT_OTHER)
Admission: RE | Admit: 2019-12-23 | Discharge: 2019-12-23 | Disposition: A | Discharge status: Home / Self Care | Payer: PPO | Attending: Urology | Admitting: Urology

## 2019-12-23 ENCOUNTER — Encounter (HOSPITAL_BASED_OUTPATIENT_CLINIC_OR_DEPARTMENT_OTHER)
Admission: RE | Disposition: A | Discharge status: Home / Self Care | Payer: Self-pay | Source: Home / Self Care | Attending: Urology

## 2019-12-23 ENCOUNTER — Encounter (HOSPITAL_BASED_OUTPATIENT_CLINIC_OR_DEPARTMENT_OTHER): Payer: Self-pay | Admitting: Urology

## 2019-12-23 DIAGNOSIS — Z794 Long term (current) use of insulin: Secondary | ICD-10-CM | POA: Diagnosis not present

## 2019-12-23 DIAGNOSIS — E119 Type 2 diabetes mellitus without complications: Secondary | ICD-10-CM | POA: Diagnosis not present

## 2019-12-23 DIAGNOSIS — F17210 Nicotine dependence, cigarettes, uncomplicated: Secondary | ICD-10-CM | POA: Diagnosis not present

## 2019-12-23 DIAGNOSIS — I10 Essential (primary) hypertension: Secondary | ICD-10-CM | POA: Insufficient documentation

## 2019-12-23 DIAGNOSIS — N3289 Other specified disorders of bladder: Secondary | ICD-10-CM | POA: Insufficient documentation

## 2019-12-23 DIAGNOSIS — D494 Neoplasm of unspecified behavior of bladder: Secondary | ICD-10-CM | POA: Diagnosis not present

## 2019-12-23 DIAGNOSIS — C675 Malignant neoplasm of bladder neck: Secondary | ICD-10-CM | POA: Diagnosis not present

## 2019-12-23 DIAGNOSIS — Z8551 Personal history of malignant neoplasm of bladder: Secondary | ICD-10-CM | POA: Insufficient documentation

## 2019-12-23 DIAGNOSIS — Z96651 Presence of right artificial knee joint: Secondary | ICD-10-CM | POA: Insufficient documentation

## 2019-12-23 DIAGNOSIS — C679 Malignant neoplasm of bladder, unspecified: Secondary | ICD-10-CM

## 2019-12-23 DIAGNOSIS — I2510 Atherosclerotic heart disease of native coronary artery without angina pectoris: Secondary | ICD-10-CM | POA: Insufficient documentation

## 2019-12-23 DIAGNOSIS — G4733 Obstructive sleep apnea (adult) (pediatric): Secondary | ICD-10-CM | POA: Diagnosis not present

## 2019-12-23 DIAGNOSIS — E1142 Type 2 diabetes mellitus with diabetic polyneuropathy: Secondary | ICD-10-CM | POA: Diagnosis not present

## 2019-12-23 HISTORY — PX: CYSTOSCOPY W/ RETROGRADES: SHX1426

## 2019-12-23 HISTORY — DX: Type 2 diabetes mellitus without complications: E11.9

## 2019-12-23 HISTORY — PX: TRANSURETHRAL RESECTION OF BLADDER TUMOR WITH MITOMYCIN-C: SHX6459

## 2019-12-23 LAB — POCT I-STAT, CHEM 8
BUN: 21 mg/dL (ref 8–23)
Calcium, Ion: 1.2 mmol/L (ref 1.15–1.40)
Chloride: 101 mmol/L (ref 98–111)
Creatinine, Ser: 1 mg/dL (ref 0.61–1.24)
Glucose, Bld: 110 mg/dL — ABNORMAL HIGH (ref 70–99)
HCT: 47 % (ref 39.0–52.0)
Hemoglobin: 16 g/dL (ref 13.0–17.0)
Potassium: 4.8 mmol/L (ref 3.5–5.1)
Sodium: 133 mmol/L — ABNORMAL LOW (ref 135–145)
TCO2: 27 mmol/L (ref 22–32)

## 2019-12-23 LAB — GLUCOSE, CAPILLARY: Glucose-Capillary: 114 mg/dL — ABNORMAL HIGH (ref 70–99)

## 2019-12-23 SURGERY — TRANSURETHRAL RESECTION OF BLADDER TUMOR WITH MITOMYCIN-C
Anesthesia: General | Site: Renal

## 2019-12-23 MED ORDER — PROPOFOL 10 MG/ML IV BOLUS
INTRAVENOUS | Status: AC
  Filled 2019-12-23: qty 40

## 2019-12-23 MED ORDER — ACETAMINOPHEN 500 MG PO TABS
ORAL_TABLET | ORAL | Status: AC
  Filled 2019-12-23: qty 2

## 2019-12-23 MED ORDER — PROPOFOL 10 MG/ML IV BOLUS
INTRAVENOUS | Status: DC | PRN
  Administered 2019-12-23: 170 mg via INTRAVENOUS

## 2019-12-23 MED ORDER — LIDOCAINE 2% (20 MG/ML) 5 ML SYRINGE
INTRAMUSCULAR | Status: AC
  Filled 2019-12-23: qty 5

## 2019-12-23 MED ORDER — CEPHALEXIN 500 MG PO CAPS: 500.0000 mg | ORAL_CAPSULE | Freq: Two times a day (BID) | ORAL | 0 refills | Status: AC

## 2019-12-23 MED ORDER — ONDANSETRON HCL 4 MG/2ML IJ SOLN
INTRAMUSCULAR | Status: AC
  Filled 2019-12-23: qty 2

## 2019-12-23 MED ORDER — FENTANYL CITRATE (PF) 100 MCG/2ML IJ SOLN
INTRAMUSCULAR | Status: DC | PRN
Start: 2019-12-23 — End: 2019-12-23
  Administered 2019-12-23 (×4): 25 ug via INTRAVENOUS

## 2019-12-23 MED ORDER — BELLADONNA ALKALOIDS-OPIUM 16.2-30 MG RE SUPP
1.0000 | Freq: Once | RECTAL | Status: AC
  Administered 2019-12-23: 1 via RECTAL

## 2019-12-23 MED ORDER — GEMCITABINE CHEMO FOR BLADDER INSTILLATION 2000 MG
2000.0000 mg | Freq: Once | INTRAVENOUS | Status: AC
  Administered 2019-12-23: 2000 mg via INTRAVESICAL
  Filled 2019-12-23: qty 2000

## 2019-12-23 MED ORDER — IOHEXOL 300 MG/ML  SOLN
INTRAMUSCULAR | Status: DC | PRN
  Administered 2019-12-23: 10 mL via URETHRAL

## 2019-12-23 MED ORDER — BELLADONNA ALKALOIDS-OPIUM 16.2-30 MG RE SUPP
RECTAL | Status: AC
  Filled 2019-12-23: qty 1

## 2019-12-23 MED ORDER — LIDOCAINE 2% (20 MG/ML) 5 ML SYRINGE
INTRAMUSCULAR | Status: DC | PRN
  Administered 2019-12-23: 60 mg via INTRAVENOUS

## 2019-12-23 MED ORDER — FENTANYL CITRATE (PF) 100 MCG/2ML IJ SOLN
INTRAMUSCULAR | Status: AC
  Filled 2019-12-23: qty 2

## 2019-12-23 MED ORDER — LACTATED RINGERS IV SOLN: INTRAVENOUS | Status: DC

## 2019-12-23 MED ORDER — CEFAZOLIN SODIUM-DEXTROSE 2-4 GM/100ML-% IV SOLN
2.0000 g | INTRAVENOUS | Status: AC
  Administered 2019-12-23: 2 g via INTRAVENOUS

## 2019-12-23 MED ORDER — ONDANSETRON HCL 4 MG/2ML IJ SOLN
INTRAMUSCULAR | Status: DC | PRN
  Administered 2019-12-23: 4 mg via INTRAVENOUS

## 2019-12-23 MED ORDER — OXYCODONE HCL 5 MG PO TABS
ORAL_TABLET | ORAL | Status: AC
  Filled 2019-12-23: qty 1

## 2019-12-23 MED ORDER — OXYCODONE HCL 5 MG PO TABS
5.0000 mg | ORAL_TABLET | ORAL | Status: AC
  Administered 2019-12-23: 5 mg via ORAL

## 2019-12-23 MED ORDER — ACETAMINOPHEN 500 MG PO TABS
1000.0000 mg | ORAL_TABLET | Freq: Once | ORAL | Status: AC
  Administered 2019-12-23: 1000 mg via ORAL

## 2019-12-23 MED ORDER — SODIUM CHLORIDE 0.9 % IR SOLN
Status: DC | PRN
  Administered 2019-12-23: 3500 mL

## 2019-12-23 MED ORDER — FENTANYL CITRATE (PF) 100 MCG/2ML IJ SOLN
25.0000 ug | INTRAMUSCULAR | Status: DC | PRN
  Administered 2019-12-23 (×2): 50 ug via INTRAVENOUS

## 2019-12-23 MED ORDER — AMISULPRIDE (ANTIEMETIC) 5 MG/2ML IV SOLN: 10.0000 mg | Freq: Once | INTRAVENOUS | Status: DC | PRN

## 2019-12-23 MED ORDER — STERILE WATER FOR IRRIGATION IR SOLN
Status: DC | PRN
  Administered 2019-12-23: 500 mL

## 2019-12-23 MED ORDER — CEFAZOLIN SODIUM-DEXTROSE 2-4 GM/100ML-% IV SOLN
INTRAVENOUS | Status: AC
  Filled 2019-12-23: qty 100

## 2019-12-23 SURGICAL SUPPLY — 38 items
BAG DRAIN URO-CYSTO SKYTR STRL (DRAIN) ×3 IMPLANT
BAG DRN RND TRDRP ANRFLXCHMBR (UROLOGICAL SUPPLIES) ×2
BAG DRN UROCATH (DRAIN) ×2
BAG URINE DRAIN 2000ML AR STRL (UROLOGICAL SUPPLIES) ×1 IMPLANT
BAG URINE LEG 500ML (DRAIN) IMPLANT
BASKET ZERO TIP NITINOL 2.4FR (BASKET) IMPLANT
BSKT STON RTRVL ZERO TP 2.4FR (BASKET)
CATH FOLEY 2WAY SLVR  5CC 20FR (CATHETERS) ×3
CATH FOLEY 2WAY SLVR  5CC 22FR (CATHETERS)
CATH FOLEY 2WAY SLVR 5CC 20FR (CATHETERS) IMPLANT
CATH FOLEY 2WAY SLVR 5CC 22FR (CATHETERS) IMPLANT
CATH FOLEY 3WAY 30CC 22F (CATHETERS) IMPLANT
CATH INTERMIT  6FR 70CM (CATHETERS) ×3 IMPLANT
CLOTH BEACON ORANGE TIMEOUT ST (SAFETY) ×3 IMPLANT
ELECT REM PT RETURN 9FT ADLT (ELECTROSURGICAL) ×3
ELECTRODE REM PT RTRN 9FT ADLT (ELECTROSURGICAL) ×2 IMPLANT
EVACUATOR MICROVAS BLADDER (UROLOGICAL SUPPLIES) IMPLANT
FIBER LASER FLEXIVA 365 (UROLOGICAL SUPPLIES) IMPLANT
FIBER LASER TRAC TIP (UROLOGICAL SUPPLIES) IMPLANT
GLOVE BIO SURGEON STRL SZ8 (GLOVE) ×3 IMPLANT
GOWN STRL REUS W/ TWL XL LVL3 (GOWN DISPOSABLE) ×2 IMPLANT
GOWN STRL REUS W/TWL XL LVL3 (GOWN DISPOSABLE) ×6 IMPLANT
GUIDEWIRE ANG ZIPWIRE 038X150 (WIRE) IMPLANT
GUIDEWIRE STR DUAL SENSOR (WIRE) IMPLANT
HOLDER FOLEY CATH W/STRAP (MISCELLANEOUS) ×1 IMPLANT
IV NS IRRIG 3000ML ARTHROMATIC (IV SOLUTION) ×6 IMPLANT
KIT TURNOVER CYSTO (KITS) ×3 IMPLANT
LOOP CUT BIPOLAR 24F LRG (ELECTROSURGICAL) ×3 IMPLANT
MANIFOLD NEPTUNE II (INSTRUMENTS) ×3 IMPLANT
NS IRRIG 500ML POUR BTL (IV SOLUTION) ×3 IMPLANT
PACK CYSTO (CUSTOM PROCEDURE TRAY) ×3 IMPLANT
PLUG CATH AND CAP STER (CATHETERS) IMPLANT
SHEATH URETERAL 12FRX35CM (MISCELLANEOUS) IMPLANT
SYR TOOMEY IRRIG 70ML (MISCELLANEOUS) ×3
SYRINGE TOOMEY IRRIG 70ML (MISCELLANEOUS) ×2 IMPLANT
TUBE CONNECTING 12X1/4 (SUCTIONS) ×3 IMPLANT
TUBING UROLOGY SET (TUBING) IMPLANT
WATER STERILE IRR 500ML POUR (IV SOLUTION) ×1 IMPLANT

## 2019-12-23 NOTE — Anesthesia Preprocedure Evaluation (Signed)
Anesthesia Evaluation  Patient identified by MRN, date of birth, ID band Patient awake    Reviewed: Allergy & Precautions, NPO status , Patient's Chart, lab work & pertinent test results  Airway Mallampati: II  TM Distance: >3 FB Neck ROM: Full    Dental  (+) Dental Advisory Given   Pulmonary sleep apnea , COPD, Current Smoker and Patient abstained from smoking.,    breath sounds clear to auscultation       Cardiovascular hypertension, Pt. on medications  Rhythm:Regular Rate:Normal     Neuro/Psych negative neurological ROS     GI/Hepatic Neg liver ROS, hiatal hernia, GERD  ,  Endo/Other  diabetes, Type 2, Insulin Dependent  Renal/GU negative Renal ROS     Musculoskeletal   Abdominal   Peds  Hematology   Anesthesia Other Findings   Reproductive/Obstetrics                             Anesthesia Physical Anesthesia Plan  ASA: III  Anesthesia Plan: General   Post-op Pain Management:    Induction: Intravenous  PONV Risk Score and Plan: 1 and Dexamethasone, Ondansetron, Treatment may vary due to age or medical condition and Midazolam  Airway Management Planned: LMA  Additional Equipment: None  Intra-op Plan:   Post-operative Plan: Extubation in OR  Informed Consent: I have reviewed the patients History and Physical, chart, labs and discussed the procedure including the risks, benefits and alternatives for the proposed anesthesia with the patient or authorized representative who has indicated his/her understanding and acceptance.     Dental advisory given  Plan Discussed with: CRNA  Anesthesia Plan Comments:         Anesthesia Quick Evaluation

## 2019-12-23 NOTE — Anesthesia Postprocedure Evaluation (Signed)
Anesthesia Post Note  Patient: [name]  Procedure(s) Performed: TRANSURETHRAL RESECTION OF BLADDER TUMOR WITH POST-OP  GEMCITABINE (N/A Renal) CYSTOSCOPY WITH RETROGRADE PYELOGRAM (Bilateral )     Patient location during evaluation: PACU Anesthesia Type: General Level of consciousness: awake and alert Pain management: pain level controlled Vital Signs Assessment: post-procedure vital signs reviewed and stable Respiratory status: spontaneous breathing, nonlabored ventilation, respiratory function stable and patient connected to nasal cannula oxygen Cardiovascular status: blood pressure returned to baseline and stable Postop Assessment: no apparent nausea or vomiting Anesthetic complications: no   No complications documented.  Last Vitals:  Vitals:   12/23/19 1400 12/23/19 1415  BP: (!) 163/81 (!) 178/89  Pulse: 73 76  Resp: 11 12  Temp:    SpO2: 97% 97%    Last Pain:  Vitals:   12/23/19 1419  TempSrc:   PainSc: 9                  Michael Horn

## 2019-12-23 NOTE — Anesthesia Procedure Notes (Signed)
Procedure Name: LMA Insertion Date/Time: 12/23/2019 1:16 PM Performed by: Suan Halter, CRNA Pre-anesthesia Checklist: Patient identified, Emergency Drugs available, Suction available and Patient being monitored Patient Re-evaluated:Patient Re-evaluated prior to induction Oxygen Delivery Method: Circle system utilized Preoxygenation: Pre-oxygenation with 100% oxygen Induction Type: IV induction Ventilation: Mask ventilation without difficulty LMA: LMA inserted LMA Size: 4.0 Number of attempts: 1 Airway Equipment and Method: Bite block Placement Confirmation: positive ETCO2 Tube secured with: Tape Dental Injury: Teeth and Oropharynx as per pre-operative assessment

## 2019-12-23 NOTE — Discharge Instructions (Signed)
1. You may see some blood in the urine and may have some burning with urination for 48-72 hours. You also may notice that you have to urinate more frequently or urgently after your procedure which is normal.  2. You should call should you develop an inability urinate, fever > 101, persistent nausea and vomiting that 3. If you have a catheter, you will be taught how to take care of the catheter by the nursing staff prior to discharge from the hospital.  You may periodically feel a strong urge to void with the catheter in place.  This is a bladder spasm and most often can occur when having a bowel movement or moving around. It is typically self-limited and usually will stop after a few minutes.  You may use some Vaseline or Neosporin around the tip of the catheter to reduce friction at the tip of the penis. You may also see some blood in the urine.  A very small amount of blood can make the urine look quite red.  As long as the catheter is draining well, there usually is not a problem.  However, if the catheter is not draining well and is bloody, you should call the office [phone]) to notify us.  It is okay to remove the catheter as instructed on Friday morning.  Indwelling Urinary Catheter Care, Adult An indwelling urinary catheter is a thin tube that is put into your bladder. The tube helps to drain pee (urine) out of your body. The tube goes in through your urethra. Your urethra is where pee comes out of your body. Your pee will come out through the catheter, then it will go into a bag (drainage bag). Take good care of your catheter so it will work well. How to wear your catheter and bag Supplies needed  Sticky tape (adhesive tape) or a leg strap.  Alcohol wipe or soap and water (if you use tape).  A clean towel (if you use tape).  Large overnight bag.  Smaller bag (leg bag). Wearing your catheter Attach your catheter to your leg with tape or a leg strap.  Make sure the catheter is not  pulled tight.  If a leg strap gets wet, take it off and put on a dry strap.  If you use tape to hold the bag on your leg: 1. Use an alcohol wipe or soap and water to wash your skin where the tape made it sticky before. 2. Use a clean towel to pat-dry that skin. 3. Use new tape to make the bag stay on your leg. Wearing your bags You should have been given a large overnight bag.  You may wear the overnight bag in the day or night.  Always have the overnight bag lower than your bladder.  Do not let the bag touch the floor.  Before you go to sleep, put a clean plastic bag in a wastebasket. Then hang the overnight bag inside the wastebasket. You should also have a smaller leg bag that fits under your clothes.  Always wear the leg bag below your knee.  Do not wear your leg bag at night. How to care for your skin and catheter Supplies needed  A clean washcloth.  Water and mild soap.  A clean towel. Caring for your skin and catheter      Clean the skin around your catheter every day: 1. Wash your hands with soap and water. 2. Wet a clean washcloth in warm water and mild soap. 3. Clean  the skin around your urethra.  If you are male:  Gently spread the folds of skin around your vagina (labia).  With the washcloth in your other hand, wipe the inner side of your labia on each side. Wipe from front to back.  If you are male:  Pull back any skin that covers the end of your penis (foreskin).  With the washcloth in your other hand, wipe your penis in small circles. Start wiping at the tip of your penis, then move away from the catheter.  Move the foreskin back in place, if needed. 4. With your free hand, hold the catheter close to where it goes into your body.  Keep holding the catheter during cleaning so it does not get pulled out. 5. With the washcloth in your other hand, clean the catheter.  Only wipe downward on the catheter.  Do not wipe upward toward your body.  Doing this may push germs into your urethra and cause infection. 6. Use a clean towel to pat-dry the catheter and the skin around it. Make sure to wipe off all soap. 7. Wash your hands with soap and water.  Shower every day. Do not take baths.  Do not use cream, ointment, or lotion on the area where the catheter goes into your body, unless your doctor tells you to.  Do not use powders, sprays, or lotions on your genital area.  Check your skin around the catheter every day for signs of infection. Check for: ? Redness, swelling, or pain. ? Fluid or blood. ? Warmth. ? Pus or a bad smell. How to empty the bag Supplies needed  Rubbing alcohol.  Gauze pad or cotton ball.  Tape or a leg strap. Emptying the bag Pour the pee out of your bag when it is ?- full, or at least 2-3 times a day. Do this for your overnight bag and your leg bag. 1. Wash your hands with soap and water. 2. Separate (detach) the bag from your leg. 3. Hold the bag over the toilet or a clean pail. Keep the bag lower than your hips and bladder. This is so the pee (urine) does not go back into the tube. 4. Open the pour spout. It is at the bottom of the bag. 5. Empty the pee into the toilet or pail. Do not let the pour spout touch any surface. 6. Put rubbing alcohol on a gauze pad or cotton ball. 7. Use the gauze pad or cotton ball to clean the pour spout. 8. Close the pour spout. 9. Attach the bag to your leg with tape or a leg strap. 10. Wash your hands with soap and water. Follow instructions for cleaning the drainage bag:  From the product maker.  As told by your doctor. How to change the bag Supplies needed  Alcohol wipes.  A clean bag.  Tape or a leg strap. Changing the bag Replace your bag when it starts to leak, smell bad, or look dirty. 1. Wash your hands with soap and water. 2. Separate the dirty bag from your leg. 3. Pinch the catheter with your fingers so that pee does not spill  out. 4. Separate the catheter tube from the bag tube where these tubes connect (at the connection valve). Do not let the tubes touch any surface. 5. Clean the end of the catheter tube with an alcohol wipe. Use a different alcohol wipe to clean the end of the bag tube. 6. Connect the catheter tube to the tube of  the clean bag. 7. Attach the clean bag to your leg with tape or a leg strap. Do not make the bag tight on your leg. 8. Wash your hands with soap and water. General rules   Never pull on your catheter. Never try to take it out. Doing that can hurt you.  Always wash your hands before and after you touch your catheter or bag. Use a mild, fragrance-free soap. If you do not have soap and water, use hand sanitizer.  Always make sure there are no twists or bends (kinks) in the catheter tube.  Always make sure there are no leaks in the catheter or bag.  Drink enough fluid to keep your pee pale yellow.  Do not take baths, swim, or use a hot tub.  If you are male, wipe from front to back after you poop (have a bowel movement). Contact a doctor if:  Your pee is cloudy.  Your pee smells worse than usual.  Your catheter gets clogged.  Your catheter leaks.  Your bladder feels full. Get help right away if:  You have redness, swelling, or pain where the catheter goes into your body.  You have fluid, blood, pus, or a bad smell coming from the area where the catheter goes into your body.  Your skin feels warm where the catheter goes into your body.  You have a fever.  You have pain in your: ? Belly (abdomen). ? Legs. ? Lower back. ? Bladder.  You see blood in the catheter.  Your pee is pink or red.  You feel sick to your stomach (nauseous).  You throw up (vomit).  You have chills.  Your pee is not draining into the bag.  Your catheter gets pulled out. Summary  An indwelling urinary catheter is a thin tube that is placed into the bladder to help drain pee (urine)  out of the body.  The catheter is placed into the part of the body that drains pee from the bladder (urethra).  Taking good care of your catheter will keep it working properly and help prevent problems.  Always wash your hands before and after touching your catheter or bag.  Never pull on your catheter or try to take it out. This information is not intended to replace advice given to you by your health care provider. Make sure you discuss any questions you have with your health care provider. Document Revised: 06/19/2018 Document Reviewed: 10/11/2016 Elsevier Patient Education  [place] Instructions  Activity: Get plenty of rest for the remainder of the day. A responsible individual must stay with you for 24 hours following the procedure.  For the next 24 hours, DO NOT: -Drive a car -Paediatric nurse -Drink alcoholic beverages -Take any medication unless instructed by your physician -Make any legal decisions or sign important papers.  Meals: Start with liquid foods such as gelatin or soup. Progress to regular foods as tolerated. Avoid greasy, spicy, heavy foods. If nausea and/or vomiting occur, drink only clear liquids until the nausea and/or vomiting subsides. Call your physician if vomiting continues.  Special Instructions/Symptoms: Your throat may feel dry or sore from the anesthesia or the breathing tube placed in your throat during surgery. If this causes discomfort, gargle with warm salt water. The discomfort should disappear within 24 hours.

## 2019-12-23 NOTE — Op Note (Signed)
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  This was found during recent cystoscopic evaluation for routine surveillance in the office.  He presents at this time for resection of this as well as placement of postoperative intravesical gemcitabine.  I discussed the procedure, risks complications and expected outcomes with the patient.  He understands these and desires to proceed.  Findings: Urethra was without lesions.  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  No lesions within the prostatic urethra.  Bladder inspected circumferentially.  Generalized 2+ trabeculations.  Ureteral orifice ease normal in configuration and location.  Old biopsy/resection sites were easily identified with scar, no urothelial lesions were noted except for a 1.5 cm area in the anterior bladder neck with 2 papillary lesions.  Retrograde ureteropyelogram performed with Omnipaque revealed normal caliber ureters bilaterally throughout their course, without evidence of hydronephrosis, stricture/filling defects.  Pyelocalyceal systems bilaterally were normal as well.  Description of procedure: The patient was properly identified in the holding area.  Was taken the operating room where general anesthetic was administered with the LMA.  He was placed in the dorsolithotomy position.  Genitalia and perineum were prepped, draped.  Proper timeout performed.  75 French  panendoscope passed under direct vision through the urethra and into the bladder with the above-mentioned findings noted.  Ureteral pyelograms were performed bilaterally using a 6 [place] open-ended catheter and Omnipaque with normal findings bilaterally.  At this point the scope was removed and replaced with a 26 French obturator which was introduced with the visual obturator.  The resectoscope and cutting loop were placed.  The tumor at the bladder neck was resected.  It was quite difficult, but with nursing assistance and pressure at the bladder neck anteriorly, I was able to resect the involved areas into the muscular layer quite easily.  Fragments were sent for pathology.  The tumor base was cauterized and was hemostatic.  There being no other lesions within the bladder, the scope was removed.  I then placed a 20 French Foley catheter and the balloon was filled with 10 cc of water.  The bladder was drained.  The catheter was plugged and the patient was brought to the operating room after he was awakened.  He was in stable condition at this point.  2 g of gemcitabine and 50 mL of diluent was then placed and left indwelling for an hour.  At that time it was drained, and the catheter was hooked to dependent drainage.

## 2019-12-23 NOTE — Interval H&P Note (Signed)
History and Physical Interval Note:  12/23/2019 1:01 PM  Michael Horn  has presented today for surgery, with the diagnosis of BLADDER CANCER.  The various methods of treatment have been discussed with the patient and family. After consideration of risks, benefits and other options for treatment, the patient has consented to  Procedure(s) with comments: TRANSURETHRAL RESECTION OF BLADDER TUMOR WITH GEMCITABINE (N/A) - 45 MINS CYSTOSCOPY WITH RETROGRADE PYELOGRAM (Bilateral) as a surgical intervention.  The patient's history has been reviewed, patient examined, no change in status, stable for surgery.  I have reviewed the patient's chart and labs.  Questions were answered to the patient's satisfaction.     Lillette Boxer Maris Bena

## 2019-12-23 NOTE — Transfer of Care (Signed)
Immediate Anesthesia Transfer of Care Note  Patient: Michael Horn  Procedure(s) Performed: Procedure(s) (LRB): TRANSURETHRAL RESECTION OF BLADDER TUMOR WITH POST-OP  GEMCITABINE (N/A) CYSTOSCOPY WITH RETROGRADE PYELOGRAM (Bilateral)  Patient Location: PACU  Anesthesia Type: General  Level of Consciousness: awake, oriented, sedated and patient cooperative  Airway & Oxygen Therapy: Patient Spontanous Breathing and Patient connected to face mask oxygen  Post-op Assessment: Report given to PACU RN and Post -op Vital signs reviewed and stable  Post vital signs: Reviewed and stable  Complications: No apparent anesthesia complications Last Vitals:  Vitals Value Taken Time  BP 171/90 12/23/19 1349  Temp    Pulse 78 12/23/19 1352  Resp 10 12/23/19 1352  SpO2 97 % 12/23/19 1352  Vitals shown include unvalidated device data.  Last Pain:  Vitals:   12/23/19 1218  TempSrc: Oral  PainSc: 0-No pain      Patients Stated Pain Goal: 5 (18/56/31 4970)  Complications: No complications documented.

## 2019-12-24 ENCOUNTER — Encounter (HOSPITAL_BASED_OUTPATIENT_CLINIC_OR_DEPARTMENT_OTHER): Payer: Self-pay | Admitting: Urology

## 2019-12-24 LAB — SURGICAL PATHOLOGY

## 2020-01-11 DIAGNOSIS — H6091 Unspecified otitis externa, right ear: Secondary | ICD-10-CM | POA: Diagnosis not present

## 2020-01-14 DIAGNOSIS — Z8551 Personal history of malignant neoplasm of bladder: Secondary | ICD-10-CM | POA: Diagnosis not present

## 2020-01-26 ENCOUNTER — Emergency Department (HOSPITAL_COMMUNITY)
Admission: EM | Admit: 2020-01-26 | Discharge: 2020-01-27 | Disposition: A | Discharge status: Home / Self Care | Payer: PPO | Attending: Emergency Medicine | Admitting: Emergency Medicine

## 2020-01-26 ENCOUNTER — Emergency Department (HOSPITAL_COMMUNITY): Payer: PPO

## 2020-01-26 DIAGNOSIS — E119 Type 2 diabetes mellitus without complications: Secondary | ICD-10-CM | POA: Insufficient documentation

## 2020-01-26 DIAGNOSIS — W19XXXA Unspecified fall, initial encounter: Secondary | ICD-10-CM

## 2020-01-26 DIAGNOSIS — Z79891 Long term (current) use of opiate analgesic: Secondary | ICD-10-CM | POA: Diagnosis not present

## 2020-01-26 DIAGNOSIS — Z8551 Personal history of malignant neoplasm of bladder: Secondary | ICD-10-CM | POA: Diagnosis not present

## 2020-01-26 DIAGNOSIS — G894 Chronic pain syndrome: Secondary | ICD-10-CM | POA: Diagnosis not present

## 2020-01-26 DIAGNOSIS — Z79899 Other long term (current) drug therapy: Secondary | ICD-10-CM | POA: Insufficient documentation

## 2020-01-26 DIAGNOSIS — S42302A Unspecified fracture of shaft of humerus, left arm, initial encounter for closed fracture: Secondary | ICD-10-CM | POA: Diagnosis not present

## 2020-01-26 DIAGNOSIS — S4992XA Unspecified injury of left shoulder and upper arm, initial encounter: Secondary | ICD-10-CM | POA: Diagnosis present

## 2020-01-26 DIAGNOSIS — F17210 Nicotine dependence, cigarettes, uncomplicated: Secondary | ICD-10-CM | POA: Diagnosis not present

## 2020-01-26 DIAGNOSIS — Z96698 Presence of other orthopedic joint implants: Secondary | ICD-10-CM | POA: Insufficient documentation

## 2020-01-26 DIAGNOSIS — R52 Pain, unspecified: Secondary | ICD-10-CM | POA: Diagnosis not present

## 2020-01-26 DIAGNOSIS — Y9389 Activity, other specified: Secondary | ICD-10-CM | POA: Insufficient documentation

## 2020-01-26 DIAGNOSIS — I2510 Atherosclerotic heart disease of native coronary artery without angina pectoris: Secondary | ICD-10-CM | POA: Insufficient documentation

## 2020-01-26 DIAGNOSIS — Y929 Unspecified place or not applicable: Secondary | ICD-10-CM | POA: Diagnosis not present

## 2020-01-26 DIAGNOSIS — Z794 Long term (current) use of insulin: Secondary | ICD-10-CM | POA: Diagnosis not present

## 2020-01-26 DIAGNOSIS — Z7984 Long term (current) use of oral hypoglycemic drugs: Secondary | ICD-10-CM | POA: Insufficient documentation

## 2020-01-26 DIAGNOSIS — M25519 Pain in unspecified shoulder: Secondary | ICD-10-CM | POA: Diagnosis not present

## 2020-01-26 DIAGNOSIS — M62830 Muscle spasm of back: Secondary | ICD-10-CM | POA: Diagnosis not present

## 2020-01-26 DIAGNOSIS — I10 Essential (primary) hypertension: Secondary | ICD-10-CM | POA: Diagnosis not present

## 2020-01-26 DIAGNOSIS — J449 Chronic obstructive pulmonary disease, unspecified: Secondary | ICD-10-CM | POA: Diagnosis not present

## 2020-01-26 DIAGNOSIS — M47817 Spondylosis without myelopathy or radiculopathy, lumbosacral region: Secondary | ICD-10-CM | POA: Diagnosis not present

## 2020-01-26 DIAGNOSIS — R58 Hemorrhage, not elsewhere classified: Secondary | ICD-10-CM | POA: Diagnosis not present

## 2020-01-26 DIAGNOSIS — M25561 Pain in right knee: Secondary | ICD-10-CM | POA: Diagnosis not present

## 2020-01-26 DIAGNOSIS — R0902 Hypoxemia: Secondary | ICD-10-CM | POA: Diagnosis not present

## 2020-01-26 DIAGNOSIS — Y999 Unspecified external cause status: Secondary | ICD-10-CM | POA: Insufficient documentation

## 2020-01-26 DIAGNOSIS — M25512 Pain in left shoulder: Secondary | ICD-10-CM | POA: Diagnosis not present

## 2020-01-26 MED ORDER — FENTANYL CITRATE (PF) 100 MCG/2ML IJ SOLN
100.0000 ug | Freq: Once | INTRAMUSCULAR | Status: AC
  Administered 2020-01-26: 100 ug via INTRAVENOUS
  Filled 2020-01-26: qty 2

## 2020-01-26 MED ORDER — NICOTINE 21 MG/24HR TD PT24
21.0000 mg | MEDICATED_PATCH | Freq: Once | TRANSDERMAL | Status: DC
  Administered 2020-01-26: 21 mg via TRANSDERMAL
  Filled 2020-01-26: qty 1

## 2020-01-26 MED ORDER — OXYCODONE-ACETAMINOPHEN 5-325 MG PO TABS
1.0000 | ORAL_TABLET | Freq: Once | ORAL | Status: AC
  Administered 2020-01-26: 1 via ORAL
  Filled 2020-01-26: qty 1

## 2020-01-26 NOTE — ED Provider Notes (Addendum)
[place] EMERGENCY DEPARTMENT Provider Note   CSN: [id number] Arrival date & time: 01/26/20  4854     History Chief Complaint  Patient presents with  . Fall    Michael Horn is a 69 y.o. male.  With PMH of arthritis, bladder cancer, CAD, tobacco use disorder, diabetes, OSA, COPD, right knee replacement and left shoulder replacement who presents to the ED after a fall from his tractor trailer. Patient states earlier today, he stepped on an unstable bar on his trailer and fell from about 5 feet. Patient states he fell on his left shoulder and it felt like he had dislocated his shoulder.  His shoulder was unstable he was in significant pain.  He laid down for about 30 minutes until his wife saw him and called EMS.  He denies LOC or hitting head on the ground. Patient endorse 10/10 left shoulder pain but denies any dizziness, headaches, blurry vision, chest pain, neck pain, shortness of breath, leg pain or trouble walking.  HPI     Past Medical History:  Diagnosis Date  . Arthritis    Arthritis -DDD."chronic pain med used"  . Bladder tumor    states that it was cancer  . Cancer (HCC)    Bladder cancer"-Dr.Ottelin- checks every 3 months--no signs last checks.  . Chronic knee pain RIGHT KNEE --  S/P KNEE REPLACEMENT JAN 2012--  PT STATES  NEEDS ANOTHER REPLACEMENT DUE TO JOINT RECALL  . Complication of anesthesia EMERGENCE DELIRIUM   occurred x1- 2 yrs ago.few surgeries ago, none recently  . COPD (chronic obstructive pulmonary disease) ([place])   . Coronary artery disease   . Depression   . DM type 2 (diabetes mellitus, type 2) ([place])   . GERD (gastroesophageal reflux disease)   . H/O hiatal hernia   . Headache   . History of bladder cancer TCC OF BLADDER  --  FOLLOWED BY DR Karsten Ro  . History of peptic ulcer AGE 27  . Hypertension    pt denies, takes lisimopril for kidneys  . Mild obstructive sleep apnea    per study 09-26-2007--  no cpap rx  . Nocturia     . Peripheral neuropathy BOTTOM RIGHT FOOT  . Seasonal allergies   . Short of breath on exertion   . Urgency of urination     Patient Active Problem List   Diagnosis Date Noted  . 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 S/P reverse total shoulder arthroplasty, left 04/08/2018  . Obesity, unspecified 07/08/2013  . Mixed hyperlipidemia 07/08/2013  . Chest pain, unspecified 07/08/2013  . Tobacco use disorder 07/08/2013  . 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 BACK SURGERY  X2   lower back  . BILATERAL KNEE ARTHROSCOPY    . BILATERAL SHOULDER SURG.  2003  . CARDIAC CATHETERIZATION  04-13-2007   ---- DR Irish Lack   NO SIGNIFICANT CAD/ NORMAL LVF  . CARPAL TUNNEL RELEASE  04-11-2006   RIGHT  . COLONOSCOPY WITH PROPOFOL N/A 05/01/2015   Procedure: COLONOSCOPY WITH PROPOFOL;  Surgeon: Garlan Fair, MD;  Location: [facility];  Service: Endoscopy;  Laterality: N/A;  . CYSTOSCOPY W/ RETROGRADES Bilateral 12/23/2019   Procedure: CYSTOSCOPY WITH RETROGRADE PYELOGRAM;  Surgeon: Franchot Gallo, MD;  [facility];  Service: Urology;  Laterality: Bilateral;  . LEFT CARPAL. TUNNEL RELEASE    . LEFT EYE SURG.  2008   REMOVAL OF BB  . REPAIR RECURRENT LEFT ROTATOR CUFF TEAR  01-02-11  . REVERSE SHOULDER ARTHROPLASTY Left 04/08/2018   Procedure: REVERSE SHOULDER ARTHROPLASTY;  Surgeon: Nicholes Stairs, MD;  Location: [facility];  Service: Orthopedics;  Laterality: Left;  2.5 hours  . SIGMOID COLECTOMY  09-29-2001   DIVERTICULITIS  . TOTAL KNEE ARTHROPLASTY  04-03-10   RIGHT  . TRANSURETHRAL RESECTION OF BLADDER TUMOR  07-06-10  . TRANSURETHRAL RESECTION OF BLADDER TUMOR  05/20/2011   Procedure: TRANSURETHRAL RESECTION OF BLADDER TUMOR (TURBT);  Surgeon: Claybon Jabs, MD;  Location: [facility];  Service: Urology;  Laterality:  N/A;  GYRUS INSTILL MYTOMYCIN C NO BED  . TRANSURETHRAL RESECTION OF BLADDER TUMOR  01/31/2012   Procedure: TRANSURETHRAL RESECTION OF BLADDER TUMOR (TURBT);  Surgeon: Claybon Jabs, MD;  Location: [facility];  Service: Urology;  Laterality: N/A;  . TRANSURETHRAL RESECTION OF BLADDER TUMOR WITH GYRUS (TURBT-GYRUS) N/A 11/22/2013   Procedure: Bladder biopsy with fulgeration;  Surgeon: Claybon Jabs, MD;  Location: [facility];  Service: Urology;  Laterality: N/A;  . TRANSURETHRAL RESECTION OF BLADDER TUMOR WITH MITOMYCIN-C N/A 12/23/2019   Procedure: TRANSURETHRAL RESECTION OF BLADDER TUMOR WITH POST-OP  GEMCITABINE;  Surgeon: Franchot Gallo, MD;  Location: [facility];  Service: Urology;  Laterality: N/A;  87 MINS       Family History  Problem Relation Age of Onset  . Heart attack Mother     Social History   Tobacco Use  . Smoking status: Current Every Day Smoker    Packs/day: 1.50    Years: 50.00    Pack years: 75.00    Types: Cigarettes  . Smokeless tobacco: Never Used  Vaping Use  . Vaping Use: Never used  Substance Use Topics  . Alcohol use: No    Alcohol/week: 0.0 standard drinks  . Drug use: No    Home Medications Prior to Admission medications   Medication Sig Start Date End Date Taking? Authorizing Provider  albuterol (PROAIR HFA) 108 (90 Base) MCG/ACT inhaler Inhale 2 puffs into the lungs every 4 (four) hours as needed for wheezing or shortness of breath.    [provider]  esomeprazole (NEXIUM) 20 MG capsule Take 20 mg by mouth.     [provider]  ezetimibe (ZETIA) 10 MG tablet Take 10 mg by mouth at bedtime.    [provider]  ferrous sulfate 324 MG TBEC Take 324 mg by mouth.    [provider]  insulin aspart protamine- aspart (NOVOLOG MIX 70/30) (70-30) 100 UNIT/ML injection Inject 44 Units into the skin 2 (two) times daily.     [provider]  lisinopril  (PRINIVIL,ZESTRIL) 10 MG tablet Take 10 mg by mouth every morning.    [provider]  metFORMIN (GLUCOPHAGE) 1000 MG tablet Take 1,000 mg by mouth 2 (two) times daily.    [provider]  Oxycodone HCl 10 MG TABS Take 1-2 tablets (10-20 mg total) by mouth every 6 (six) hours as needed for up to 60 doses (severe pain). 04/09/18   Nicholes Stairs, MD  pregabalin (LYRICA) 75 MG capsule Take 75 mg by mouth 2 (two) times daily.    [provider]  traZODone (DESYREL) 50 MG tablet Take 50 mg by mouth at bedtime.    [provider]  trospium (SANCTURA) 20 MG tablet Take 20 mg by mouth 2 (two) times daily.    [provider]  Zinc Sulfate (ZINC 15 PO) Take by mouth.    [provider]    Allergies    Contrast media [iodinated diagnostic agents]  Review of Systems   Review of Systems  HENT: Negative for facial swelling.   Eyes: Negative for visual disturbance.  Respiratory: Negative for chest tightness and shortness of breath.   Cardiovascular: Negative for chest pain and leg swelling.  Gastrointestinal: Negative for abdominal distention and abdominal pain.  Genitourinary: Negative for flank pain.  Musculoskeletal: Positive for arthralgias and myalgias. Negative for neck pain and neck stiffness.       Left shoulder pain  Neurological: Negative for dizziness, weakness and headaches.  Hematological: Does not bruise/bleed easily.  Psychiatric/Behavioral: Negative for agitation.    Physical Exam Updated Vital Signs BP (!) 153/73 (BP Location: Right Arm)   Temp 98.5 F (36.9 C) (Oral)   Resp 14   Ht 5\' 10"  (1.778 m)   Wt 115.2 kg   SpO2 96%   BMI 36.45 kg/m   Physical Exam Constitutional:      Appearance: Normal appearance.     Comments: Mild distress due to pain  HENT:     Head: Normocephalic and atraumatic.     Nose: Nose normal.     Mouth/Throat:     Mouth: Mucous membranes are moist.     Pharynx: Oropharynx is clear.    Eyes:     Extraocular Movements: Extraocular movements intact.     Conjunctiva/sclera: Conjunctivae normal.  Cardiovascular:     Rate and Rhythm: Normal rate and regular rhythm.     Pulses: Normal pulses.     Heart sounds: No murmur heard.   Pulmonary:     Effort: Pulmonary effort is normal. No respiratory distress.     Breath sounds: No wheezing.  Abdominal:     General: Bowel sounds are normal.     Palpations: Abdomen is soft.     Tenderness: There is no abdominal tenderness.  Musculoskeletal:        General: Tenderness and signs of injury present. No swelling or deformity.     Cervical back: No rigidity or tenderness.     Comments: Tenderness to palpation of the deltoid region of left shoulder  Skin:    General: Skin is warm and dry.     Capillary Refill: Capillary refill takes less than 2 seconds.     Findings: No bruising.  Neurological:     General: No focal deficit present.     Mental Status: He is alert and oriented to person, place, and time.     Motor: No weakness.  Psychiatric:        Mood and Affect: Mood normal.     ED Results / Procedures / Treatments   Labs (all labs ordered are listed, but only abnormal results are displayed) Labs Reviewed - No data to display  EKG None  Radiology DG Chest 1 View  Result Date: 01/26/2020 CLINICAL DATA:  Left shoulder pain after fall EXAM: CHEST  1 VIEW COMPARISON:  01/04/2019 FINDINGS: Heart and mediastinal contours are within normal limits. No focal opacities or effusions. No acute bony abnormality. Prior left shoulder replacement. IMPRESSION: No active cardiopulmonary disease. Electronically Signed   By: Rolm Baptise M.D.   On: 01/26/2020 21:18   DG Shoulder Left  Result Date: 01/26/2020 CLINICAL DATA:  Left shoulder pain, fall EXAM: LEFT SHOULDER - 2+ VIEW COMPARISON:  None. FINDINGS: Prior left shoulder replacement. Just below the humeral shaft  hardware, there is a mid humeral fracture with angulation and  displacement. No subluxation or dislocation. IMPRESSION: Angulated, displaced mid left humeral shaft fracture just below the shoulder replacement hardware. Electronically Signed   By: Rolm Baptise M.D.   On: 01/26/2020 21:17   DG Humerus Left  Result Date: 01/26/2020 CLINICAL DATA:  Fall, left arm pain. EXAM: LEFT HUMERUS - 2+ VIEW COMPARISON:  Shoulder series today FINDINGS: There is a left shoulder replacement. Just below the hardware in the proximal left humerus, there is a left humeral midshaft fracture. Mild angulation and displacement. IMPRESSION: Left humeral midshaft fracture just below the shoulder replacement hardware. Electronically Signed   By: Rolm Baptise M.D.   On: 01/26/2020 21:18    Procedures Procedures (including critical care time)  Medications Ordered in ED Medications  fentaNYL (SUBLIMAZE) injection 100 mcg (has no administration in time range)    ED Course  I have reviewed the triage vital signs and the nursing notes.  Pertinent labs & imaging results that were available during my care of the patient were reviewed by me and considered in my medical decision making (see chart for details).    MDM Rules/Calculators/A&P                          Patient is a 69 year old with history of reverse left shoulder arthroplasty and right knee replacement who presented after falling off his tractor trailer and landed on his left shoulder. Patient endorses significant left shoulder pain and instability but denied LOC, hitting head or other injuries. Patient is not on any anticoagulation. On exam, there is tenderness to palpation of the deltoid area of the left shoulder. Neurovascularly intact in both upper extremities. X-ray of the left shoulder reviewed angulated mid left humeral shaft fracture just below the shoulder replacement hardware. Patient's pain controlled with pain medication. Orthopedic consulted. Recommended shoulder and arm splint for stability and follow-up in clinic  tomorrow. Discharging home. Patient is advised to take an extra dose of his oxycodone at home for pain refill.   Final Clinical Impression(s) / ED Diagnoses Final diagnoses:  Closed fracture of shaft of left humerus, unspecified fracture morphology, initial encounter    Rx / DC Orders ED Discharge Orders    None       Lacinda Axon, MD 01/26/20 2313    Lacinda Axon, MD 01/26/20 0093    Quintella Reichert, MD 01/29/20 1954

## 2020-01-26 NOTE — Discharge Instructions (Addendum)
Michael Horn, thank you for seeking care at [facility]. We did an Xray of your left shoulder and it showed that you sustained a fracture of your upper arm when you fell. We have placed your arm and shoulder in a splint to help stabilize it. We talked we orthopedic surgery and they would like for you to follow up with them tomorrow. Please make sure to follow up with them at your convenience tomorrow. We are discharging you home. Make sure to take an extra dose of your oxycodone if your pain is not under control.

## 2020-01-26 NOTE — ED Triage Notes (Signed)
Pt BIB EMS. Pt fell from his tractor, ~35ft, denies hitting head or LOC. Pt says he was by himself for 45 minutes until found by wife and daughter. Pt complaining of left shoulder pain, left leg pain and flank pain. Pt thinks that shoulder is dislocated (sling placed by EMS) has had shoulder replaced before.  Pt has a hx of diabetes, L shoulder replacement, and knee surgery.  Pt reports that he takes Lisinopril for his diabetes - denies HTN.  EMS vitals  150/90 HR 76  O2 96 CBG 168

## 2020-01-28 DIAGNOSIS — S42302S Unspecified fracture of shaft of humerus, left arm, sequela: Secondary | ICD-10-CM | POA: Diagnosis not present

## 2020-02-11 DIAGNOSIS — S42302S Unspecified fracture of shaft of humerus, left arm, sequela: Secondary | ICD-10-CM | POA: Diagnosis not present

## 2020-02-16 DIAGNOSIS — Z8551 Personal history of malignant neoplasm of bladder: Secondary | ICD-10-CM | POA: Diagnosis not present

## 2020-02-16 DIAGNOSIS — N3941 Urge incontinence: Secondary | ICD-10-CM | POA: Diagnosis not present

## 2020-02-16 DIAGNOSIS — R3912 Poor urinary stream: Secondary | ICD-10-CM | POA: Diagnosis not present

## 2020-03-02 DIAGNOSIS — S42302S Unspecified fracture of shaft of humerus, left arm, sequela: Secondary | ICD-10-CM | POA: Diagnosis not present

## 2020-03-15 DIAGNOSIS — F322 Major depressive disorder, single episode, severe without psychotic features: Secondary | ICD-10-CM | POA: Diagnosis not present

## 2020-03-15 DIAGNOSIS — I700 Atherosclerosis of aorta: Secondary | ICD-10-CM | POA: Diagnosis not present

## 2020-03-15 DIAGNOSIS — E782 Mixed hyperlipidemia: Secondary | ICD-10-CM | POA: Diagnosis not present

## 2020-03-15 DIAGNOSIS — N528 Other male erectile dysfunction: Secondary | ICD-10-CM | POA: Diagnosis not present

## 2020-03-15 DIAGNOSIS — K219 Gastro-esophageal reflux disease without esophagitis: Secondary | ICD-10-CM | POA: Diagnosis not present

## 2020-03-15 DIAGNOSIS — F17200 Nicotine dependence, unspecified, uncomplicated: Secondary | ICD-10-CM | POA: Diagnosis not present

## 2020-03-15 DIAGNOSIS — M159 Polyosteoarthritis, unspecified: Secondary | ICD-10-CM | POA: Diagnosis not present

## 2020-03-15 DIAGNOSIS — I10 Essential (primary) hypertension: Secondary | ICD-10-CM | POA: Diagnosis not present

## 2020-03-15 DIAGNOSIS — C679 Malignant neoplasm of bladder, unspecified: Secondary | ICD-10-CM | POA: Diagnosis not present

## 2020-03-15 DIAGNOSIS — E1142 Type 2 diabetes mellitus with diabetic polyneuropathy: Secondary | ICD-10-CM | POA: Diagnosis not present

## 2020-03-15 DIAGNOSIS — Z794 Long term (current) use of insulin: Secondary | ICD-10-CM | POA: Diagnosis not present

## 2020-03-15 DIAGNOSIS — Z0000 Encounter for general adult medical examination without abnormal findings: Secondary | ICD-10-CM | POA: Diagnosis not present

## 2020-03-15 DIAGNOSIS — J449 Chronic obstructive pulmonary disease, unspecified: Secondary | ICD-10-CM | POA: Diagnosis not present

## 2020-03-15 DIAGNOSIS — E669 Obesity, unspecified: Secondary | ICD-10-CM | POA: Diagnosis not present

## 2020-03-15 DIAGNOSIS — Z720 Tobacco use: Secondary | ICD-10-CM | POA: Diagnosis not present

## 2020-03-22 DIAGNOSIS — M62830 Muscle spasm of back: Secondary | ICD-10-CM | POA: Diagnosis not present

## 2020-03-22 DIAGNOSIS — M25561 Pain in right knee: Secondary | ICD-10-CM | POA: Diagnosis not present

## 2020-03-22 DIAGNOSIS — M47817 Spondylosis without myelopathy or radiculopathy, lumbosacral region: Secondary | ICD-10-CM | POA: Diagnosis not present

## 2020-03-22 DIAGNOSIS — G894 Chronic pain syndrome: Secondary | ICD-10-CM | POA: Diagnosis not present

## 2020-03-23 DIAGNOSIS — Z8551 Personal history of malignant neoplasm of bladder: Secondary | ICD-10-CM | POA: Diagnosis not present

## 2020-03-23 DIAGNOSIS — N3941 Urge incontinence: Secondary | ICD-10-CM | POA: Diagnosis not present

## 2020-03-23 DIAGNOSIS — R3915 Urgency of urination: Secondary | ICD-10-CM | POA: Diagnosis not present

## 2020-03-23 DIAGNOSIS — R3912 Poor urinary stream: Secondary | ICD-10-CM | POA: Diagnosis not present

## 2020-03-28 DIAGNOSIS — S42302S Unspecified fracture of shaft of humerus, left arm, sequela: Secondary | ICD-10-CM | POA: Diagnosis not present

## 2020-05-17 DIAGNOSIS — G894 Chronic pain syndrome: Secondary | ICD-10-CM | POA: Diagnosis not present

## 2020-05-17 DIAGNOSIS — M47817 Spondylosis without myelopathy or radiculopathy, lumbosacral region: Secondary | ICD-10-CM | POA: Diagnosis not present

## 2020-05-17 DIAGNOSIS — M62830 Muscle spasm of back: Secondary | ICD-10-CM | POA: Diagnosis not present

## 2020-05-17 DIAGNOSIS — M25561 Pain in right knee: Secondary | ICD-10-CM | POA: Diagnosis not present

## 2020-05-30 DIAGNOSIS — M1712 Unilateral primary osteoarthritis, left knee: Secondary | ICD-10-CM | POA: Diagnosis not present

## 2020-06-08 DIAGNOSIS — M654 Radial styloid tenosynovitis [de Quervain]: Secondary | ICD-10-CM | POA: Diagnosis not present

## 2020-06-08 DIAGNOSIS — M65321 Trigger finger, right index finger: Secondary | ICD-10-CM | POA: Diagnosis not present

## 2020-07-10 DIAGNOSIS — N3941 Urge incontinence: Secondary | ICD-10-CM | POA: Diagnosis not present

## 2020-07-10 DIAGNOSIS — Z8551 Personal history of malignant neoplasm of bladder: Secondary | ICD-10-CM | POA: Diagnosis not present

## 2020-07-12 DIAGNOSIS — M25561 Pain in right knee: Secondary | ICD-10-CM | POA: Diagnosis not present

## 2020-07-12 DIAGNOSIS — M47817 Spondylosis without myelopathy or radiculopathy, lumbosacral region: Secondary | ICD-10-CM | POA: Diagnosis not present

## 2020-07-12 DIAGNOSIS — G894 Chronic pain syndrome: Secondary | ICD-10-CM | POA: Diagnosis not present

## 2020-07-12 DIAGNOSIS — M62830 Muscle spasm of back: Secondary | ICD-10-CM | POA: Diagnosis not present

## 2020-07-14 DIAGNOSIS — M65321 Trigger finger, right index finger: Secondary | ICD-10-CM | POA: Diagnosis not present

## 2020-09-04 DIAGNOSIS — M25561 Pain in right knee: Secondary | ICD-10-CM | POA: Diagnosis not present

## 2020-09-04 DIAGNOSIS — M47817 Spondylosis without myelopathy or radiculopathy, lumbosacral region: Secondary | ICD-10-CM | POA: Diagnosis not present

## 2020-09-04 DIAGNOSIS — M62830 Muscle spasm of back: Secondary | ICD-10-CM | POA: Diagnosis not present

## 2020-09-04 DIAGNOSIS — G894 Chronic pain syndrome: Secondary | ICD-10-CM | POA: Diagnosis not present

## 2020-09-05 DIAGNOSIS — M1712 Unilateral primary osteoarthritis, left knee: Secondary | ICD-10-CM | POA: Diagnosis not present

## 2020-09-13 DIAGNOSIS — E1142 Type 2 diabetes mellitus with diabetic polyneuropathy: Secondary | ICD-10-CM | POA: Diagnosis not present

## 2020-09-13 DIAGNOSIS — R1310 Dysphagia, unspecified: Secondary | ICD-10-CM | POA: Diagnosis not present

## 2020-09-13 DIAGNOSIS — Z713 Dietary counseling and surveillance: Secondary | ICD-10-CM | POA: Diagnosis not present

## 2020-09-13 DIAGNOSIS — Z794 Long term (current) use of insulin: Secondary | ICD-10-CM | POA: Diagnosis not present

## 2020-09-13 DIAGNOSIS — I10 Essential (primary) hypertension: Secondary | ICD-10-CM | POA: Diagnosis not present

## 2020-09-13 DIAGNOSIS — E1165 Type 2 diabetes mellitus with hyperglycemia: Secondary | ICD-10-CM | POA: Diagnosis not present

## 2020-09-13 DIAGNOSIS — E782 Mixed hyperlipidemia: Secondary | ICD-10-CM | POA: Diagnosis not present

## 2020-09-13 DIAGNOSIS — E1159 Type 2 diabetes mellitus with other circulatory complications: Secondary | ICD-10-CM | POA: Diagnosis not present

## 2020-09-13 DIAGNOSIS — Z720 Tobacco use: Secondary | ICD-10-CM | POA: Diagnosis not present

## 2020-09-13 DIAGNOSIS — E669 Obesity, unspecified: Secondary | ICD-10-CM | POA: Diagnosis not present

## 2020-09-13 DIAGNOSIS — F17200 Nicotine dependence, unspecified, uncomplicated: Secondary | ICD-10-CM | POA: Diagnosis not present

## 2020-09-18 DIAGNOSIS — M5459 Other low back pain: Secondary | ICD-10-CM | POA: Diagnosis not present

## 2020-09-18 DIAGNOSIS — M5136 Other intervertebral disc degeneration, lumbar region: Secondary | ICD-10-CM | POA: Diagnosis not present

## 2020-09-21 DIAGNOSIS — R945 Abnormal results of liver function studies: Secondary | ICD-10-CM | POA: Diagnosis not present

## 2020-10-20 ENCOUNTER — Encounter: Payer: Self-pay | Admitting: Physician Assistant

## 2020-10-20 ENCOUNTER — Ambulatory Visit: Payer: PPO | Admitting: Physician Assistant

## 2020-10-20 VITALS — BP 128/70 | HR 88 | Ht 70.5 in | Wt 264.0 lb

## 2020-10-20 DIAGNOSIS — Z86010 Personal history of colonic polyps: Secondary | ICD-10-CM

## 2020-10-20 DIAGNOSIS — K219 Gastro-esophageal reflux disease without esophagitis: Secondary | ICD-10-CM | POA: Diagnosis not present

## 2020-10-20 DIAGNOSIS — R1310 Dysphagia, unspecified: Secondary | ICD-10-CM

## 2020-10-20 MED ORDER — ESOMEPRAZOLE MAGNESIUM 40 MG PO CPDR: 40.0000 mg | DELAYED_RELEASE_CAPSULE | Freq: Every day | ORAL | 5 refills | Status: DC

## 2020-10-20 MED ORDER — CLENPIQ 10-3.5-12 MG-GM -GM/160ML PO SOLN: 1.0000 | Freq: Once | ORAL | 0 refills | Status: AC

## 2020-10-20 NOTE — Progress Notes (Signed)
Agree with assessment and plan as outlined.  

## 2020-10-20 NOTE — Patient Instructions (Signed)
We have sent the following medications to your pharmacy for you to pick up at your convenience: Increase Nexium to 40 mg daily 30-60 minutes before breakfast.   You have been scheduled for an endoscopy and colonoscopy. Please follow the written instructions given to you at your visit today. Please pick up your prep supplies at the pharmacy within the next 1-3 days. If you use inhalers (even only as needed), please bring them with you on the day of your procedure.  If you are age 53 or older, your body mass index should be between 23-30. Your Body mass index is 37.34 kg/m. If this is out of the aforementioned range listed, please consider follow up with your Primary Care Provider.  If you are age 72 or younger, your body mass index should be between 19-25. Your Body mass index is 37.34 kg/m. If this is out of the aformentioned range listed, please consider follow up with your Primary Care Provider.   __________________________________________________________  The [facility] providers would like to encourage you to use [facility] to communicate with providers for non-urgent requests or questions.  Due to long hold times on the telephone, sending your provider a message by [facility] may be a faster and more efficient way to get a response.  Please allow 48 business hours for a response.  Please remember that this is for non-urgent requests.
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
## 2020-10-30 DIAGNOSIS — M47817 Spondylosis without myelopathy or radiculopathy, lumbosacral region: Secondary | ICD-10-CM | POA: Diagnosis not present

## 2020-10-30 DIAGNOSIS — Z79891 Long term (current) use of opiate analgesic: Secondary | ICD-10-CM | POA: Diagnosis not present

## 2020-10-30 DIAGNOSIS — G894 Chronic pain syndrome: Secondary | ICD-10-CM | POA: Diagnosis not present

## 2020-10-30 DIAGNOSIS — M62830 Muscle spasm of back: Secondary | ICD-10-CM | POA: Diagnosis not present

## 2020-10-30 DIAGNOSIS — M25561 Pain in right knee: Secondary | ICD-10-CM | POA: Diagnosis not present

## 2020-11-15 ENCOUNTER — Ambulatory Visit (AMBULATORY_SURGERY_CENTER): Payer: PPO | Admitting: Gastroenterology

## 2020-11-15 ENCOUNTER — Encounter: Payer: Self-pay | Admitting: Gastroenterology

## 2020-11-15 ENCOUNTER — Other Ambulatory Visit: Payer: Self-pay

## 2020-11-15 VITALS — BP 111/61 | HR 69 | Temp 98.0°F | Resp 15 | Ht 70.0 in | Wt 264.0 lb

## 2020-11-15 DIAGNOSIS — R1310 Dysphagia, unspecified: Secondary | ICD-10-CM

## 2020-11-15 DIAGNOSIS — K219 Gastro-esophageal reflux disease without esophagitis: Secondary | ICD-10-CM

## 2020-11-15 DIAGNOSIS — Z860100 Personal history of colon polyps, unspecified: Secondary | ICD-10-CM

## 2020-11-15 MED ORDER — SODIUM CHLORIDE 0.9 % IV SOLN: 500.0000 mL | INTRAVENOUS | Status: DC

## 2020-11-15 NOTE — Progress Notes (Signed)
Pt's states no medical or surgical changes since previsit or office visit. 

## 2020-11-15 NOTE — Progress Notes (Signed)
Called to room to assist during endoscopic procedure.  Patient ID and intended procedure confirmed with present staff. Received instructions for my participation in the procedure from the performing physician.  

## 2020-11-15 NOTE — Patient Instructions (Addendum)
You will need a repeat colonoscopy in 1 year with a 2 day prep.  Read all of the handouts given to you by your recovery room nurse.  Please, follow the diet suggestions given to you.  YOU HAD AN ENDOSCOPIC PROCEDURE TODAY AT [facility]:   Refer to the procedure report that was given to you for any specific questions about what was found during the examination.  If the procedure report does not answer your questions, please call your gastroenterologist to clarify.  If you requested that your care partner not be given the details of your procedure findings, then the procedure report has been included in a sealed envelope for you to review at your convenience later.  YOU SHOULD EXPECT: Some feelings of bloating in the abdomen. Passage of more gas than usual.  Walking can help get rid of the air that was put into your GI tract during the procedure and reduce the bloating. If you had a lower endoscopy (such as a colonoscopy or flexible sigmoidoscopy) you may notice spotting of blood in your stool or on the toilet paper. If you underwent a bowel prep for your procedure, you may not have a normal bowel movement for a few days.  Please Note:  You might notice some irritation and congestion in your nose or some drainage.  This is from the oxygen used during your procedure.  There is no need for concern and it should clear up in a day or so.  SYMPTOMS TO REPORT IMMEDIATELY:  Following lower endoscopy (colonoscopy or flexible sigmoidoscopy):  Excessive amounts of blood in the stool  Significant tenderness or worsening of abdominal pains  Swelling of the abdomen that is new, acute  Fever of 100F or higher  Following upper endoscopy (EGD)  Vomiting of blood or coffee ground material  New chest pain or pain under the shoulder blades  Painful or persistently difficult swallowing  New shortness of breath  Fever of 100F or higher  Black, tarry-looking stools  For urgent or emergent issues,  a gastroenterologist can be reached at any hour by calling [phone]. Do not use MyChart messaging for urgent concerns.    DIET:  We do recommend clear liquids until 3:30 p m.  A soft diet is suggested for the rest of today.  You may resume a regular diet tomorrow.  Drink plenty of fluids but you should avoid alcoholic beverages for 24 hours.  ACTIVITY:  You should plan to take it easy for the rest of today and you should NOT DRIVE or use heavy machinery until tomorrow (because of the sedation medicines used during the test).    FOLLOW UP: Our staff will call the number listed on your records 48-72 hours following your procedure to check on you and address any questions or concerns that you may have regarding the information given to you following your procedure. If we do not reach you, we will leave a message.  We will attempt to reach you two times.  During this call, we will ask if you have developed any symptoms of COVID 19. If you develop any symptoms (ie: fever, flu-like symptoms, shortness of breath, cough etc.) before then, please call [phone].  If you test positive for Covid 19 in the 2 weeks post procedure, please call and report this information to [place].    If any biopsies were taken you will be contacted by phone or by letter within the next 1-3 weeks.  Please call us at [phone] if you have not heard about the biopsies in 3 weeks.    SIGNATURES/CONFIDENTIALITY: You and/or your care partner have signed paperwork which will be entered into your electronic medical record.  These signatures attest to the fact that that the information above on your After Visit Summary has been reviewed and is understood.  Full responsibility of the confidentiality of this discharge information lies with you and/or your care-partner.

## 2020-11-15 NOTE — Op Note (Signed)
[facility] Patient Name: Michael Horn Procedure Date: 11/15/2020 1:34 PM MRN: [id number] Endoscopist: Remo Lipps P. Havery Moros , MD Age: 70 Referring MD:  Date of Birth: 11/26/1950 Gender: Male Account #: [id number] Procedure:                Upper GI endoscopy Indications:              Dysphagia, history of GERD Medicines:                Monitored Anesthesia Care Procedure:                Pre-Anesthesia Assessment:                           - Prior to the procedure, a History and Physical                            was performed, and patient medications and                            allergies were reviewed. The patient's tolerance of                            previous anesthesia was also reviewed. The risks                            and benefits of the procedure and the sedation                            options and risks were discussed with the patient.                            All questions were answered, and informed consent                            was obtained. Prior Anticoagulants: The patient has                            taken no previous anticoagulant or antiplatelet                            agents. ASA Grade Assessment: III - A patient with                            severe systemic disease. After reviewing the risks                            and benefits, the patient was deemed in                            satisfactory condition to undergo the procedure.                           After obtaining informed consent, the endoscope was  passed under direct vision. Throughout the                            procedure, the patient's blood pressure, pulse, and                            oxygen saturations were monitored continuously. The                            [id number] was introduced through the                            mouth, and advanced to the second part of duodenum.                            The upper GI endoscopy  was accomplished without                            difficulty. The patient tolerated the procedure                            well. Scope In: Scope Out: Findings:                 Esophagogastric landmarks were identified: the                            Z-line was found at 44 cm, the gastroesophageal                            junction was found at 44 cm and the upper extent of                            the gastric folds was found at 44 cm from the                            incisors.                           The exam of the esophagus was otherwise normal. No                            focal stenosis / stricture appreciated.                           A guidewire was placed and the scope was withdrawn.                            Empiric dilation was performed in the entire                            esophagus with a Savary dilator with mild                            resistance  at 17 mm. Relook endoscopy showd no                            mucosal wrents. Biopsies were taken with a cold                            forceps in the upper third of the esophagus, in the                            middle third of the esophagus and in the lower                            third of the esophagus for histology.                           The entire examined stomach was normal.                           The duodenal bulb and second portion of the                            duodenum were normal. Complications:            No immediate complications. Estimated blood loss:                            Minimal. Estimated Blood Loss:     Estimated blood loss was minimal. Impression:               - Esophagogastric landmarks identified.                           - Normal esophagus otherwise - empiric dilation                            performed to 67m and biopsies taken to rule out EoE                           - Normal stomach.                           - Normal duodenal bulb and second portion of the                             duodenum. Recommendation:           - Patient has a contact number available for                            emergencies. The signs and symptoms of potential                            delayed complications were discussed with the                            patient. Return to  normal activities tomorrow.                            Written discharge instructions were provided to the                            patient.                           - Resume previous diet.                           - Continue present medications.                           - Await pathology results and course post dilation. Remo Lipps P. Havery Moros, MD 11/15/2020 2:24:45 PM This report has been signed electronically.

## 2020-11-15 NOTE — Progress Notes (Signed)
To pacu, VSS. Report to [email or web address] 

## 2020-11-15 NOTE — Op Note (Signed)
[facility] Patient Name: Michael Horn Procedure Date: 11/15/2020 1:33 PM MRN: [id number] Endoscopist: Viviann Spare P. Adela Lank , MD Age: 70 Referring MD:  Date of Birth: 1950/12/25 Gender: Male Account #: [id number] Procedure:                Colonoscopy Indications:              High risk colon cancer surveillance: Personal                            history of colonic polyps - last exam 2017 -                            adenomas Medicines:                Monitored Anesthesia Care Procedure:                Pre-Anesthesia Assessment:                           - Prior to the procedure, a History and Physical                            was performed, and patient medications and                            allergies were reviewed. The patient's tolerance of                            previous anesthesia was also reviewed. The risks                            and benefits of the procedure and the sedation                            options and risks were discussed with the patient.                            All questions were answered, and informed consent                            was obtained. Prior Anticoagulants: The patient has                            taken no previous anticoagulant or antiplatelet                            agents. ASA Grade Assessment: III - A patient with                            severe systemic disease. After reviewing the risks                            and benefits, the patient was deemed in  satisfactory condition to undergo the procedure.                           After obtaining informed consent, the colonoscope                            was passed under direct vision. Throughout the                            procedure, the patient's blood pressure, pulse, and                            oxygen saturations were monitored continuously. The                            CF [id number] #[id number] was introduced through the anus                             and advanced to the the cecum, identified by                            appendiceal orifice and ileocecal valve. The                            colonoscopy was technically difficult and complex                            due to significant looping. The patient tolerated                            the procedure well. The quality of the bowel                            preparation was mostly adequate except for cecal                            cap and proximal right colon. The ileocecal valve,                            appendiceal orifice, and rectum were photographed. Scope In: 1:52:27 PM Scope Out: 2:11:21 PM Scope Withdrawal Time: 0 hours 8 minutes 44 seconds  Total Procedure Duration: 0 hours 18 minutes 54 seconds  Findings:                 The perianal and digital rectal examinations were                            normal.                           A large amount of liquid stool was found in the                            entire colon, making visualization difficult.  Lavage of the colon was performed using copious                            amounts of sterile water, resulting in clearance                            with adequate visualization in most portions.                            However, cecum was difficult to clear despite                            extensive lavage, adherent stool on the wall of the                            colon - subtle of flat polyps may not have been                            appreciated in the cecum or proximal ascending                            colon in this light                           A single medium-sized angiodysplastic lesion was                            found in the sigmoid colon.                           Internal hemorrhoids were found during retroflexion.                           The colon revealed excessive looping in the right                            colon.                            The exam was otherwise without abnormality. Complications:            No immediate complications. Estimated blood loss:                            None. Estimated Blood Loss:     Estimated blood loss: none. Impression:               - Preparation of the colon was fair in the cecal                            cap and proximal right colon.                           - Stool in the entire examined colon leading to  lavage with mostly adequate views as outlined.                           - A single colonic angiodysplastic lesion.                           - Internal hemorrhoids.                           - There was significant looping of the colon.                           - The examination was otherwise normal.                           - No obvious polyps - subtle or flat lesions may                            have not been appreciated in cecum or proximal                            right colon. Recommendation:           - Patient has a contact number available for                            emergencies. The signs and symptoms of potential                            delayed complications were discussed with the                            patient. Return to normal activities tomorrow.                            Written discharge instructions were provided to the                            patient.                           - Resume previous diet.                           - Continue present medications.                           - Consider repeat colonoscopy in 1 year because the                            bowel preparation was suboptimal in the right                            colon, using 2 day prep. Viviann Spare P. Kean Gautreau, MD 11/15/2020 2:21:19 PM This report has been signed electronically.

## 2020-11-15 NOTE — Progress Notes (Signed)
History and Physical Interval Note: Patient seen on 10/20/20 for dysphagia and history of colon polyps. Here for EGD with possible dilation and colonoscopy today. He had nexium increased to twice daily since his clinic visit. Reflux seems better controlled. Continues to have intermittent dysphagia. Otherwise denies any changes since his last visit with [place], feels the same. Denies any cardiopulmonary symptoms today. Have discussed risks / benefits of the procedure and he wishes to proceed today.  11/15/2020 1:35 PM  Michael Horn  has presented today for endoscopic procedure(s), with the diagnosis of  Encounter Diagnoses  Name Primary?   Dysphagia, unspecified type Yes   Personal history of colonic polyps   .  The various methods of evaluation and treatment have been discussed with the patient and/or family. After consideration of risks, benefits and other options for treatment, the patient has consented to  the endoscopic procedure(s).   The patient's history has been reviewed, patient examined, no change in status, stable for surgery.  I have reviewed the patient's chart and labs.  Questions were answered to the patient's satisfaction.    Jolly Mango, MD [facility]

## 2020-11-17 ENCOUNTER — Telehealth: Payer: Self-pay

## 2020-11-17 ENCOUNTER — Telehealth: Payer: Self-pay | Admitting: *Deleted

## 2020-11-17 NOTE — Telephone Encounter (Signed)
  Follow up Call-  Call back number 11/15/2020  Post procedure Call Back phone  # [phone]  Permission to leave phone message Yes  Some recent data might be hidden     Patient questions:  Do you have a fever, pain , or abdominal swelling? No. Pain Score  0 *  Have you tolerated food without any problems? Yes.    Have you been able to return to your normal activities? Yes.    Do you have any questions about your discharge instructions: Diet   No. Medications  No. Follow up visit  No.  Do you have questions or concerns about your Care? No.  Actions: * If pain score is 4 or above: No action needed, pain <4. Have you developed a fever since your procedure? no  2.   Have you had an respiratory symptoms (SOB or cough) since your procedure? no  3.   Have you tested positive for COVID 19 since your procedure no  4.   Have you had any family members/close contacts diagnosed with the COVID 19 since your procedure?  no   If yes to any of these questions please route to Joylene John, RN and Joella Prince, RN

## 2020-11-17 NOTE — Telephone Encounter (Signed)
First f/u call attempt.  No answer.

## 2020-12-07 DIAGNOSIS — M1712 Unilateral primary osteoarthritis, left knee: Secondary | ICD-10-CM | POA: Diagnosis not present

## 2020-12-07 IMAGING — DX DG SHOULDER 1V*L*
1 series · 1 of 1 positions shown · non-contrast
Comparison: None.

CLINICAL DATA: Status post left shoulder arthroplasty

EXAM:
LEFT SHOULDER - 1 VIEW

[shoulder]
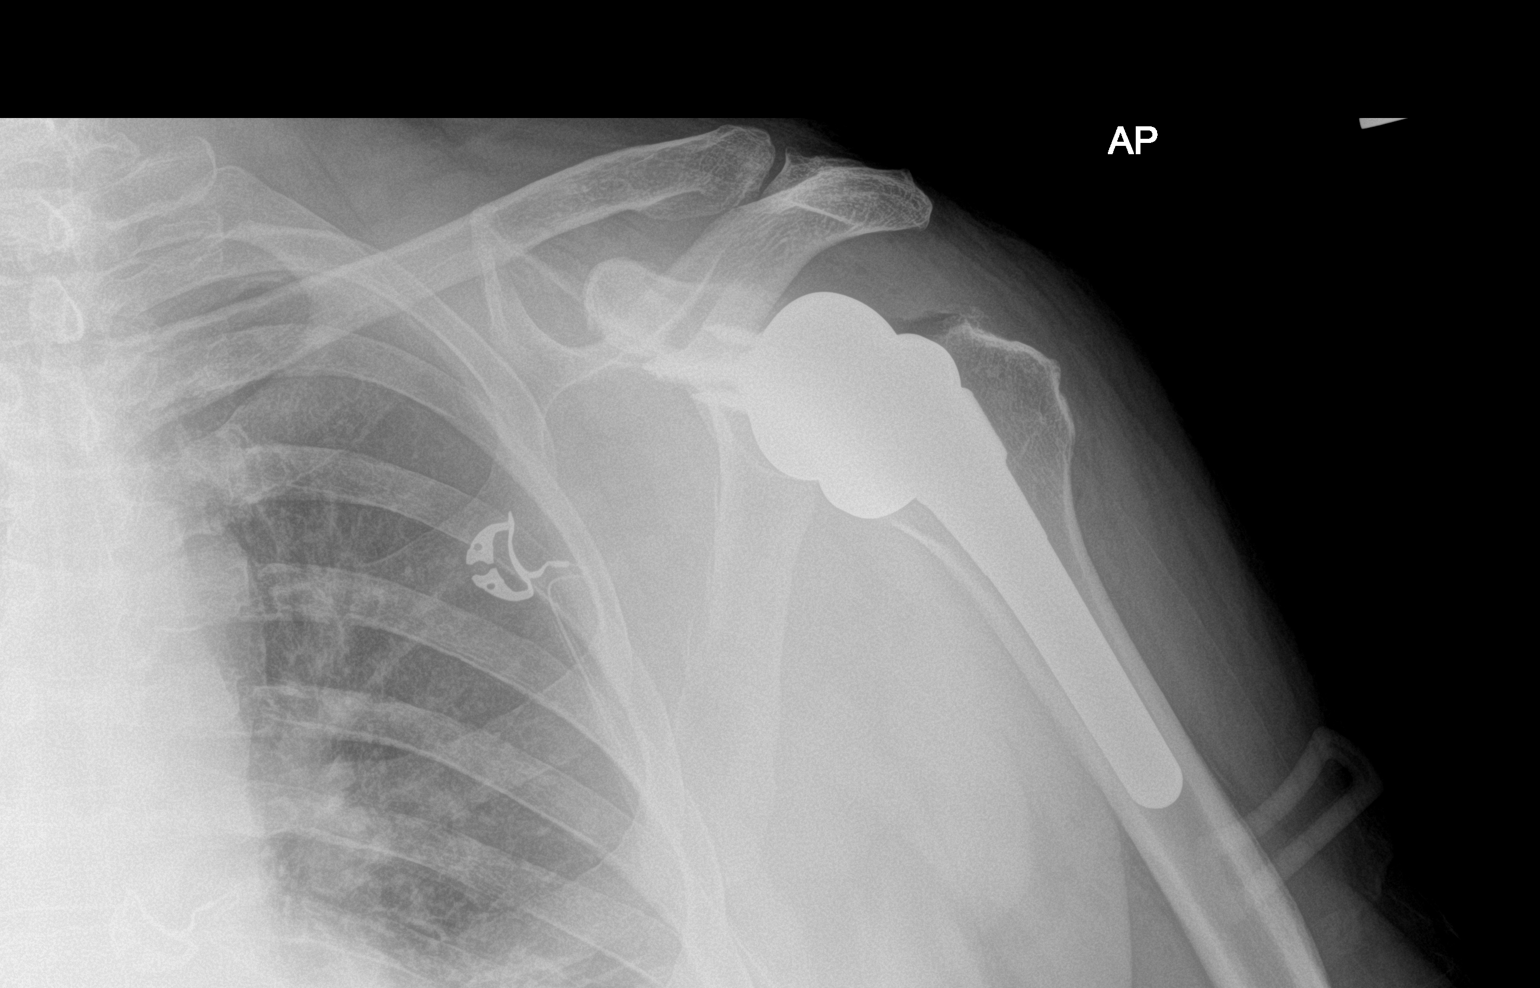

[1 of 1 positions shown; findings below may reference images not displayed]

FINDINGS: Left shoulder replacement is noted. No acute bony or soft tissue
abnormality is seen.
IMPRESSION: Status post left shoulder replacement

## 2020-12-25 DIAGNOSIS — G894 Chronic pain syndrome: Secondary | ICD-10-CM | POA: Diagnosis not present

## 2020-12-25 DIAGNOSIS — M47817 Spondylosis without myelopathy or radiculopathy, lumbosacral region: Secondary | ICD-10-CM | POA: Diagnosis not present

## 2020-12-25 DIAGNOSIS — M25561 Pain in right knee: Secondary | ICD-10-CM | POA: Diagnosis not present

## 2020-12-25 DIAGNOSIS — M62830 Muscle spasm of back: Secondary | ICD-10-CM | POA: Diagnosis not present

## 2020-12-26 ENCOUNTER — Telehealth: Payer: Self-pay | Admitting: Internal Medicine

## 2020-12-26 DIAGNOSIS — J449 Chronic obstructive pulmonary disease, unspecified: Secondary | ICD-10-CM

## 2020-12-26 NOTE — Telephone Encounter (Signed)
Patient scheduled COPD consult 01/02/21 with Dr. Shearon Stalls. Papers from [facility] faxed.  No recent cbc with diff. Called and spoke with Patient.  Patient coming to have cbc with diff 12/27/20.

## 2020-12-27 ENCOUNTER — Other Ambulatory Visit (INDEPENDENT_AMBULATORY_CARE_PROVIDER_SITE_OTHER): Payer: PPO

## 2020-12-27 DIAGNOSIS — J449 Chronic obstructive pulmonary disease, unspecified: Secondary | ICD-10-CM

## 2020-12-27 LAB — CBC WITH DIFFERENTIAL/PLATELET
Basophils Absolute: 0 10*3/uL (ref 0.0–0.1)
Basophils Relative: 0.6 % (ref 0.0–3.0)
Eosinophils Absolute: 0.2 10*3/uL (ref 0.0–0.7)
Eosinophils Relative: 2.7 % (ref 0.0–5.0)
HCT: 44.3 % (ref 39.0–52.0)
Hemoglobin: 14.8 g/dL (ref 13.0–17.0)
Lymphocytes Relative: 28.5 % (ref 12.0–46.0)
Lymphs Abs: 1.9 10*3/uL (ref 0.7–4.0)
MCHC: 33.5 g/dL (ref 30.0–36.0)
MCV: 85.5 fl (ref 78.0–100.0)
Monocytes Absolute: 0.3 10*3/uL (ref 0.1–1.0)
Monocytes Relative: 4.6 % (ref 3.0–12.0)
Neutro Abs: 4.3 10*3/uL (ref 1.4–7.7)
Neutrophils Relative %: 63.6 % (ref 43.0–77.0)
Platelets: 201 10*3/uL (ref 150.0–400.0)
RBC: 5.18 Mil/uL (ref 4.22–5.81)
RDW: 13.5 % (ref 11.5–15.5)
WBC: 6.8 10*3/uL (ref 4.0–10.5)

## 2021-01-02 ENCOUNTER — Encounter: Payer: Self-pay | Admitting: Internal Medicine

## 2021-01-02 ENCOUNTER — Ambulatory Visit: Payer: PPO | Admitting: Internal Medicine

## 2021-01-02 ENCOUNTER — Other Ambulatory Visit: Payer: Self-pay

## 2021-01-02 VITALS — BP 122/64 | HR 87 | Temp 98.4°F | Ht 70.0 in | Wt 261.2 lb

## 2021-01-02 DIAGNOSIS — J439 Emphysema, unspecified: Secondary | ICD-10-CM

## 2021-01-02 DIAGNOSIS — Z716 Tobacco abuse counseling: Secondary | ICD-10-CM

## 2021-01-02 DIAGNOSIS — F17210 Nicotine dependence, cigarettes, uncomplicated: Secondary | ICD-10-CM

## 2021-01-02 DIAGNOSIS — G4733 Obstructive sleep apnea (adult) (pediatric): Secondary | ICD-10-CM

## 2021-01-02 DIAGNOSIS — J449 Chronic obstructive pulmonary disease, unspecified: Secondary | ICD-10-CM | POA: Diagnosis not present

## 2021-01-02 MED ORDER — ALBUTEROL SULFATE HFA 108 (90 BASE) MCG/ACT IN AERS: 2.0000 | INHALATION_SPRAY | RESPIRATORY_TRACT | 5 refills | Status: AC | PRN

## 2021-01-02 MED ORDER — ANORO ELLIPTA 62.5-25 MCG/ACT IN AEPB: 1.0000 | INHALATION_SPRAY | Freq: Every day | RESPIRATORY_TRACT | 3 refills | Status: DC

## 2021-01-02 MED ORDER — BUPROPION HCL ER (SR) 150 MG PO TB12: 150.0000 mg | ORAL_TABLET | Freq: Two times a day (BID) | ORAL | 2 refills | Status: DC

## 2021-01-02 NOTE — Progress Notes (Signed)
Michael Horn    [id number]    [birth year]  Primary Care Physician:Shaw, Nathen May, MD  Referring Physician: Mayra Neer, MD [address] [place],  [place] [address] Reason for Consultation: shortness of breath Date of Consultation: 01/02/2021  Chief complaint:   Chief Complaint  Patient presents with   Consult    Pt. Wants to talk about getting oxygen.     HPI: Michael Horn is a 70 y.o. gentleman who presents for new patient evaluation of dyspnea on exertion and request for oxygen.  Has shortness of breath with exertion   Also has concerns about his sleep as well - worried he has OSA. He finally presented by being concerned about worsening symptoms during sleep. Has to sleep up right in recliner. Can't sleep on side due to bilateral shoulder OA.   Never been hospitalized for breathing. No flares of COPD requiring prednisone or antibiotics in the last couple years.   Has been prescribed inhalers in the past - has a rescue inhaler. Did well with spiriva handihaler in the past. Symbicort leave bad taste in his mouth.    OBSTRUCTIVE SLEEP APNEA SCREENING  1.  Snoring?:  Yes 2.  Tired?:  Yes 3.  Observed apnea, stop breathing or choking/gasping during sleep?:  Yes 4.  Pressure. HTN history?  Yes 5.  BMI more than 35 kg/m2?  Yes 6.  Age more than 27 yrs?  Yes 7.  Neck size larger than 17 in for male or 16 in for male?  Yes 8.  Gender = Male?  Yes  Total:  8  For general population  OSA - Low Risk : Yes to 0 - 2 questions OSA - Intermediate Risk : Yes to 3 - 4 questions OSA - High Risk : Yes to 5 - 8 questions  or Yes to 2 or more of 4 STOP questions + male gender or Yes to 2 or more of 4 STOP questions + BMI > 35kg/m2  or Yes to 2 or more of 4 STOP questions + neck circumference 17 inches / 43cm in male or 16 inches / 41cm in male  References: Rinaldo Cloud al. Anesthesiology 2008; 108: 812-821,  Gabriel Cirri et al Br Dara Hoyer 2012;  108: 998-338,  Gabriel Cirri et al J Clin Sleep Med Sept 2014.  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 Pet Schnauzer.  Smoking history: 1.5 ppd since age 60  Social History   Occupational History   Occupation: retired    Comment: Charity fundraiser business  Tobacco Use   Smoking status: Every Day    Packs/day: 1.50    Years: 60.00    Pack years: 90.00    Types: Cigarettes   Smokeless tobacco: Never  Vaping Use   Vaping Use: Never used  Substance and Sexual Activity   Alcohol use: No    Alcohol/week: 0.0 standard drinks   Drug use: No   Sexual activity: Not on file    Relevant family history:  Family History  Problem Relation Age of Onset   Heart attack Mother    Colon cancer Neg Hx    Rectal cancer Neg Hx    Stomach cancer Neg Hx     Past Medical History:  Diagnosis Date   Arthritis    Arthritis -DDD."chronic pain med used"   Bladder tumor  states that it was cancer   Cancer [facility])    Bladder cancer"-Dr.Ottelin- checks every 3 months--no signs last checks.   Chronic knee pain RIGHT KNEE --  S/P KNEE REPLACEMENT JAN 2012--  PT STATES  NEEDS ANOTHER REPLACEMENT DUE TO JOINT RECALL   Complication of anesthesia EMERGENCE DELIRIUM   occurred x1- 2 yrs ago.few surgeries ago, none recently   COPD (chronic obstructive pulmonary disease) ([place])    Coronary artery disease    Depression    DM type 2 (diabetes mellitus, type 2) (HCC)    GERD (gastroesophageal reflux disease)    H/O hiatal hernia    Headache    History of bladder cancer TCC OF BLADDER  --  FOLLOWED BY DR Karsten Ro   History of peptic ulcer AGE 58   Hypertension    pt denies, takes lisimopril for kidneys   Mild obstructive sleep apnea    per study 09-26-2007--  no cpap rx   Nocturia    Peripheral neuropathy BOTTOM RIGHT FOOT   Seasonal allergies    Short of breath on exertion    Urgency of urination     Past  Surgical History:  Procedure Laterality Date   BACK SURGERY  X2   lower back   BILATERAL KNEE ARTHROSCOPY     BILATERAL SHOULDER SURG.  2003   CARDIAC CATHETERIZATION  04-13-2007   ---- DR Irish Lack   NO SIGNIFICANT CAD/ NORMAL LVF   CARPAL TUNNEL RELEASE  04/11/2006   RIGHT   COLONOSCOPY WITH PROPOFOL N/A 05/01/2015   Procedure: COLONOSCOPY WITH PROPOFOL;  Surgeon: Garlan Fair, MD;  Location: [facility];  Service: Endoscopy;  Laterality: N/A;   CYSTOSCOPY W/ RETROGRADES Bilateral 12/23/2019   Procedure: CYSTOSCOPY WITH RETROGRADE PYELOGRAM;  Surgeon: Franchot Gallo, MD;  Location: [facility];  Service: Urology;  Laterality: Bilateral;   HAND SURGERY Right    cyst removed   LEFT CARPAL. TUNNEL RELEASE     LEFT EYE SURG.   2008   REMOVAL OF BB   REPAIR RECURRENT LEFT ROTATOR CUFF TEAR  01/02/2011   REVERSE SHOULDER ARTHROPLASTY Left 04/08/2018   Procedure: REVERSE SHOULDER ARTHROPLASTY;  Surgeon: Nicholes Stairs, MD;  Location: [facility];  Service: Orthopedics;  Laterality: Left;  2.5 hours   SIGMOID COLECTOMY  09/29/2001   DIVERTICULITIS   TOTAL KNEE ARTHROPLASTY  04/03/2010   RIGHT   TRANSURETHRAL RESECTION OF BLADDER TUMOR  07/06/2010   TRANSURETHRAL RESECTION OF BLADDER TUMOR  05/20/2011   Procedure: TRANSURETHRAL RESECTION OF BLADDER TUMOR (TURBT);  Surgeon: Claybon Jabs, MD;  Location: [facility];  Service: Urology;  Laterality: N/A;  GYRUS INSTILL MYTOMYCIN C NO BED   TRANSURETHRAL RESECTION OF BLADDER TUMOR  01/31/2012   Procedure: TRANSURETHRAL RESECTION OF BLADDER TUMOR (TURBT);  Surgeon: Claybon Jabs, MD;  Location: [facility];  Service: Urology;  Laterality: N/A;   TRANSURETHRAL RESECTION OF BLADDER TUMOR WITH GYRUS (TURBT-GYRUS) N/A 11/22/2013   Procedure: Bladder biopsy with fulgeration;  Surgeon: Claybon Jabs, MD;  Location: [facility];  Service: Urology;  Laterality: N/A;    TRANSURETHRAL RESECTION OF BLADDER TUMOR WITH MITOMYCIN-C N/A 12/23/2019   Procedure: TRANSURETHRAL RESECTION OF BLADDER TUMOR WITH POST-OP  GEMCITABINE;  Surgeon: Franchot Gallo, MD;  Location: [facility];  Service: Urology;  Laterality: N/A;  45 MINS     Physical Exam: Blood pressure 122/64, pulse 87, temperature 98.4 F (36.9 C), temperature source Oral, height 5\' 10"  (1.778  m), weight 261 lb 3.2 oz (118.5 kg), SpO2 97 %. Gen:      No acute distress ENT:  mallampati IV, no nasal polyps, mucus membranes moist Lungs:    diminished, no wheezes or crackles CV:         Regular rate and rhythm; no murmurs, rubs, or gallops.  No pedal edema Abd:      + bowel sounds; soft, non-tender; no distension MSK: no acute synovitis of DIP or PIP joints, no mechanics hands.  Skin:      Warm and dry; no rashes Neuro: normal speech, no focal facial asymmetry Psych: alert and oriented x3, normal mood and affect   Data Reviewed/Medical Decision Making:  Independent interpretation of tests: Imaging:  Review of patient's chest xray Nov 2021 images revealed no acute process. The patient's images have been independently reviewed by me.    PFTs: None on file No flowsheet data found.  Labs:  Lab Results  Component Value Date   WBC 6.8 12/27/2020   HGB 14.8 12/27/2020   HCT 44.3 12/27/2020   MCV 85.5 12/27/2020   PLT 201.0 12/27/2020   Lab Results  Component Value Date   NA 133 (L) 12/23/2019   K 4.8 12/23/2019   CL 101 12/23/2019   CO2 23 04/09/2018     Immunization status:   There is no immunization history on file for this patient.   I reviewed prior external note(s) from PCP  I reviewed the result(s) of the labs and imaging as noted above.   I have ordered PFTs home sleep apnea test.  .   Assessment:  COPD new diagnosis Everyday tobacco use disorder OSA - high likelihood  Plan/Recommendations:  Plan - start Anoro. Start prn albuterol Will obtain  PFTs Starting wellbutrin for smoking cessation Home sleep apnea test ordered.   We discussed disease management and progression at length today.   Smoking Cessation Counseling:  1. The patient is an everyday smoker and symptomatic due to the following condition COPD 2. The patient is currently contemplative in quitting smoking. 3. I advised patient to quit smoking. 4. We identified patient specific barriers to change.  5. I personally spent 4 minutes counseling the patient regarding tobacco use disorder. 6. We discussed management of stress and anxiety to help with smoking cessation, when applicable. 7. We discussed nicotine replacement therapy, Wellbutrin, Chantix as possible options. 8. I advised setting a quit date. 9. Follow?up arranged with our office to continue ongoing discussions. 10.Resources given to patient including quit hotline.    Return to Care: Return in about 4 weeks (around 01/30/2021).  Lenice Llamas, MD Pulmonary and [place]  CC: Mayra Neer, MD

## 2021-01-02 NOTE — Patient Instructions (Addendum)
Please schedule follow up scheduled with myself in 1 months.   Before your next visit I would like you to have: Full set of PFTs - 45 minutes, before next visit Home sleep test - we will contact you with this.   Start taking Anoro inhaler 1 puff once a day.   Start taking albuterol inhaler as needed up to 4 times a day. Take the albuterol rescue inhaler every 4 to 6 hours as needed for wheezing or shortness of breath. You can also take it 15 minutes before exercise or exertional activity. Side effects include heart racing or pounding, jitters or anxiety. If you have a history of an irregular heart rhythm, it can make this worse. Can also give some patients a hard time sleeping.   Bupropion -- Bupropion (brand names: Zyban, Wellbutrin) is an antidepressant that can be used to help you stop smoking.  Start taking it once per day for three days, then increase to twice daily starting four weeks before the quit date.  You should typically continue for 7 to 12 weeks after you quit smoking.  Please do not stop taking this medication abruptly.  When you are ready to stop we will have you go to once daily for two weeks and then you can do once every other day for a week before you stop.   Bupropion is generally well-tolerated, but it may cause dry mouth and difficulty sleeping. The drug should not be used by people who have a seizure disorder or bipolar (manic-depressive) disorder, and it is not recommended for those who have head trauma, anorexia nervosa, or bulimia, or for those who drink alcohol excessively.  What are the benefits of quitting smoking? Quitting smoking can lower your chances of getting or dying from heart disease, lung disease, kidney failure, infection, or cancer. It can also lower your chances of getting osteoporosis, a condition that makes your bones weak. Plus, quitting smoking can help your skin look younger and reduce the chances that you will have problems with sex.  Quitting  smoking will improve your health no matter how old you are, and no matter how long or how much you have smoked.  What should I do if I want to quit smoking? The letters in the word "START" can help you remember the steps to take: S = Set a quit date. T = Tell family, friends, and the people around you that you plan to quit. A = Anticipate or plan ahead for the tough times you'll face while quitting. R = Remove cigarettes and other tobacco products from your home, car, and work. T = Talk to your doctor about getting help to quit.  How can my doctor or nurse help? Your doctor or nurse can give you advice on the best way to quit. He or she can also put you in touch with counselors or other people you can call for support. Plus, your doctor or nurse can give you medicines to: ?Reduce your craving for cigarettes ?Reduce the unpleasant symptoms that happen when you stop smoking (called "withdrawal symptoms"). You can also get help from a free phone line (1-800-QUIT-NOW) or go online to [employer].  What are the symptoms of withdrawal? The symptoms include: ?Trouble sleeping ?Being irritable, anxious or restless ?Getting frustrated or angry ?Having trouble thinking clearly  Some people who stop smoking become temporarily depressed. Some people need treatment for depression, such as counseling or antidepressant medicines. Depressed people might: ?No longer enjoy or care about doing the  things they used to like to do ?Feel sad, down, hopeless, nervous, or cranky most of the day, almost every day ?Lose or gain weight ?Sleep too much or too little ?Feel tired or like they have no energy ?Feel guilty or like they are worth nothing ?Forget things or feel confused ?Move and speak more slowly than usual ?Act restless or have trouble staying still ?Think about death or suicide  If you think you might be depressed, see your doctor or nurse. Only someone trained in mental health can tell for  sure if you are depressed. If you ever feel like you might hurt yourself, go straight to the nearest emergency department. Or you can call for an ambulance (in the [place] and [place], [place] 9-1-1) or call your doctor or nurse right away and tell them it is an emergency. You can also reach the [place] National Suicide Prevention Lifeline at 6281210325 or http://walker-sanchez.info/.  How do medicines help you stop smoking? Different medicines work in different ways: ?Nicotine replacement therapy eases withdrawal and reduces your body's craving for nicotine, the main drug found in cigarettes. There are different forms of nicotine replacement, including skin patches, lozenges, gum, nasal sprays, and "puffers" or inhalers. Many can be bought without a prescription, while others might require one. ?Bupropion is a prescription medicine that reduces your desire to smoke. This medicine is sold under the brand names Zyban and Wellbutrin. It is also available in a generic version, which is cheaper than brand name medicines. ?Varenicline (brand names: Chantix, Champix) is a prescription medicine that reduces withdrawal symptoms and cigarette cravings. If you think you'd like to take varenicline and you have a history of depression, anxiety, or heart disease, discuss this with your doctor or nurse before taking the medicine. Varenicline can also increase the effects of alcohol in some people. It's a good idea to limit drinking while you're taking it, at least until you know how it affects you.  How does counseling work? Counseling can happen during formal office visits or just over the phone. A counselor can help you: ?Figure out what triggers your smoking and what to do instead ?Overcome cravings ?Figure out what went wrong when you tried to quit before  What works best? Studies show that people have the best luck at quitting if they take medicines to help them quit and work with a Social worker. It might also be  helpful to combine nicotine replacement with one of the prescription medicines that help people quit. In some cases, it might even make sense to take bupropion and varenicline together.  What about e-cigarettes? Sometimes people wonder if using electronic cigarettes, or "e-cigarettes," might help them quit smoking. Using e-cigarettes is also called "vaping." Doctors do not recommend e-cigarettes in place of medicines and counseling. That's because e-cigarettes still contain nicotine as well as other substances that might be harmful. It's not clear how they can affect a person's health in the long term.  Will I gain weight if I quit? Yes, you might gain a few pounds. But quitting smoking will have a much more positive effect on your health than weighing a few pounds more. Plus, you can help prevent some weight gain by being more active and eating less. Taking the medicine bupropion might help control weight gain.   What else can I do to improve my chances of quitting? You can: ?Start exercising. ?Stay away from smokers and places that you associate with smoking. If people close to you smoke, ask them to  quit with you. ?Keep gum, hard candy, or something to put in your mouth handy. If you get a craving for a cigarette, try one of these instead. ?Don't give up, even if you start smoking again. It takes most people a few tries before they succeed.  What if I am pregnant and I smoke? If you are pregnant, it's really important for the health of your baby that you quit. Ask your doctor what options you have, and what is safest for your baby  Understanding COPD   What is COPD? COPD stands for chronic obstructive pulmonary (lung) disease. COPD is a general term used for several lung diseases.  COPD is an umbrella term and encompasses other  common diseases in this group like chronic bronchitis and emphysema. Chronic asthma may also be included in this group. While some patients with COPD have only  chronic bronchitis or emphysema, most patients have a combination of both.  You might hear these terms used in exchange for one another.   COPD adds to the work of the heart. Diseased lungs may reduce the amount of oxygen that goes to the blood. High blood pressure in blood vessels from the heart to the lungs makes it difficult for the heart to pump. Lung disease can also cause the body to produce too many red blood cells which may make the blood thicker and harder to pump.   Patients who have COPD with low oxygen levels may develop an enlarged heart (cor pulmonale). This condition weakens the heart and causes increased shortness of breath and swelling in the legs and feet.   Chronic bronchitis Chronic bronchitis is irritation and inflammation (swelling) of the lining in the bronchial tubes (air passages). The irritation causes coughing and an excess amount of mucus in the airways. The swelling makes it difficult to get air in and out of the lungs. The small, hair-like structures on the inside of the airways (called cilia) may be damaged by the irritation. The cilia are then unable to help clean mucus from the airways.  Bronchitis is generally considered to be chronic when you have: a productive cough (cough up mucus) and shortness of breath that lasts about 3 months or more each year for 2 or more years in a row. Your doctor may define chronic bronchitis differently.   Emphysema Emphysema is the destruction, or breakdown, of the walls of the alveoli (air sacs) located at the end of the bronchial tubes. The damaged alveoli are not able to exchange oxygen and carbon dioxide between the lungs and the blood. The bronchioles lose their elasticity and collapse when you exhale, trapping air in the lungs. The trapped air keeps fresh air and oxygen from entering the lungs.   Who is affected by COPD? Emphysema and chronic bronchitis affect approximately 16 million people in the [place], or close to 11  percent of the population.   Symptoms of COPD  Shortness of breath  Shortness of breath with mild exercise (walking, using the stairs, etc.)  Chronic, productive cough (with mucus)  A feeling of "tightness" in the chest  Wheezing   What causes COPD? The two primary causes of COPD are cigarette smoking and alpha1-antitrypsin (AAT) deficiency. Air pollution and occupational dusts may also contribute to COPD, especially when the person exposed to these substances is a cigarette smoker.  Cigarette smoke causes COPD by irritating the airways and creating inflammation that narrows the airways, making it more difficult to breathe. Cigarette smoke also causes the cilia  to stop working properly so mucus and trapped particles are not cleaned from the airways. As a result, chronic cough and excess mucus production develop, leading to chronic bronchitis.  In some people, chronic bronchitis and infections can lead to destruction of the small airways, or emphysema.  AAT deficiency, an inherited disorder, can also lead to emphysema. Alpha antitrypsin (AAT) is a protective material produced in the liver and transported to the lungs to help combat inflammation. When there is not enough of the chemical AAT, the body is no longer protected from an enzyme in the white blood cells.   How is COPD diagnosed?  To diagnose COPD, the physician needs to know: Do you smoke?  Have you had chronic exposure to dust or air pollutants?  Do other members of your family have lung disease?  Are you short of breath?  Do you get short of breath with exercise?  Do you have chronic cough and/or wheezing?  Do you cough up excess mucus?  To help with the diagnosis, the physician will conduct a thorough physical exam which includes:  Listening to your lungs and heart  Checking your blood pressure and pulse  Examining your nose and throat  Checking your feet and ankles for swelling   Laboratory and other tests Several laboratory  and other tests are needed to confirm a diagnosis of COPD. These tests may include:  Chest X-ray to look for lung changes that could be caused by COPD   Spirometry and pulmonary function tests (PFTs) to determine lung volume and air flow  Pulse oximetry to measure the saturation of oxygen in the blood  Arterial blood gases (ABGs) to determine the amount of oxygen and carbon dioxide in the blood  Exercise testing to determine if the oxygen level in the blood drops during exercise   Treatment In the beginning stages of COPD, there is minimal shortness of breath that may be noticed only during exercise. As the disease progresses, shortness of breath may worsen and you may need to wear an oxygen device.   To help control other symptoms of COPD, the following treatments and lifestyle changes may be prescribed.  Quitting smoking  Avoiding cigarette smoke and other irritants  Taking medications including: a. bronchodilators b. anti-inflammatory agents c. oxygen d. antibiotics  Maintaining a healthy diet  Following a structured exercise program such as pulmonary rehabilitation Preventing respiratory infections  Controlling stress   If your COPD progresses, you may be eligible to be evaluated for lung volume reduction surgery or lung transplantation. You may also be eligible to participate in certain clinical trials (research studies). Ask your health care providers about studies being conducted in your hospital.   What is the outlook? Although COPD can not be cured, its symptoms can be treated and your quality of life can be improved. Your prognosis or outlook for the future will depend on how well your lungs are functioning, your symptoms, and how well you respond to and follow your treatment plan.

## 2021-01-02 NOTE — Progress Notes (Signed)
The patient has been prescribed the inhaler albuterol, anoro. Inhaler technique was demonstrated to patient. The patient subsequently demonstrated correct technique.

## 2021-01-08 DIAGNOSIS — N401 Enlarged prostate with lower urinary tract symptoms: Secondary | ICD-10-CM | POA: Diagnosis not present

## 2021-01-08 DIAGNOSIS — Z8551 Personal history of malignant neoplasm of bladder: Secondary | ICD-10-CM | POA: Diagnosis not present

## 2021-01-08 DIAGNOSIS — R3915 Urgency of urination: Secondary | ICD-10-CM | POA: Diagnosis not present

## 2021-02-02 ENCOUNTER — Ambulatory Visit (INDEPENDENT_AMBULATORY_CARE_PROVIDER_SITE_OTHER): Payer: PPO | Admitting: Internal Medicine

## 2021-02-02 ENCOUNTER — Other Ambulatory Visit: Payer: Self-pay

## 2021-02-02 DIAGNOSIS — J449 Chronic obstructive pulmonary disease, unspecified: Secondary | ICD-10-CM | POA: Diagnosis not present

## 2021-02-02 LAB — PULMONARY FUNCTION TEST
DL/VA % pred: 90 %
DL/VA: 3.65 ml/min/mmHg/L
DLCO cor % pred: 89 %
DLCO cor: 22.73 ml/min/mmHg
DLCO unc % pred: 90 %
DLCO unc: 22.86 ml/min/mmHg
FEF 25-75 Post: 1.9 L/sec
FEF 25-75 Pre: 1.49 L/sec
FEF2575-%Change-Post: 27 %
FEF2575-%Pred-Post: 79 %
FEF2575-%Pred-Pre: 62 %
FEV1-%Change-Post: 7 %
FEV1-%Pred-Post: 82 %
FEV1-%Pred-Pre: 77 %
FEV1-Post: 2.6 L
FEV1-Pre: 2.43 L
FEV1FVC-%Change-Post: 3 %
FEV1FVC-%Pred-Pre: 93 %
FEV6-%Change-Post: 4 %
FEV6-%Pred-Post: 90 %
FEV6-%Pred-Pre: 86 %
FEV6-Post: 3.68 L
FEV6-Pre: 3.51 L
FEV6FVC-%Change-Post: 0 %
FEV6FVC-%Pred-Post: 106 %
FEV6FVC-%Pred-Pre: 105 %
FVC-%Change-Post: 3 %
FVC-%Pred-Post: 85 %
FVC-%Pred-Pre: 82 %
FVC-Post: 3.68 L
FVC-Pre: 3.54 L
Post FEV1/FVC ratio: 71 %
Post FEV6/FVC ratio: 100 %
Pre FEV1/FVC ratio: 69 %
Pre FEV6/FVC Ratio: 100 %
RV % pred: 156 %
RV: 3.81 L
TLC % pred: 105 %
TLC: 7.28 L

## 2021-02-02 NOTE — Progress Notes (Signed)
PFT done today. 

## 2021-02-08 ENCOUNTER — Ambulatory Visit: Payer: PPO | Admitting: Internal Medicine

## 2021-02-08 ENCOUNTER — Encounter: Payer: Self-pay | Admitting: Internal Medicine

## 2021-02-08 ENCOUNTER — Other Ambulatory Visit: Payer: Self-pay

## 2021-02-08 VITALS — BP 122/72 | HR 84 | Temp 98.5°F | Ht 69.5 in | Wt 258.0 lb

## 2021-02-08 DIAGNOSIS — Z5320 Procedure and treatment not carried out because of patient's decision for unspecified reasons: Secondary | ICD-10-CM

## 2021-02-08 DIAGNOSIS — Z122 Encounter for screening for malignant neoplasm of respiratory organs: Secondary | ICD-10-CM

## 2021-02-08 DIAGNOSIS — G4733 Obstructive sleep apnea (adult) (pediatric): Secondary | ICD-10-CM | POA: Diagnosis not present

## 2021-02-08 MED ORDER — BREZTRI AEROSPHERE 160-9-4.8 MCG/ACT IN AERO: 2.0000 | INHALATION_SPRAY | Freq: Two times a day (BID) | RESPIRATORY_TRACT | 3 refills | Status: DC

## 2021-02-08 NOTE — Progress Notes (Signed)
Michael Horn    [id number]    1950-04-01  Primary Care Physician:Shaw, Nathen May, MD Date of Appointment: 02/08/2021 Established Patient Visit  Chief complaint:   Chief Complaint  Patient presents with   Follow-up    Patient is about the same.      HPI: Michael Horn is a 70 y.o. man with tobacco use disorder and COPD.   Interval Updates: Here for follow up after starting wellbutrin for smoking cessation and anoro inhaler. Feels it isn't working as well. Feels that anoro is coating his throat.  Also feels wellbutrin is not working as well. He has cut back from 1.5 ppd to 1 ppd.  Still with coughing and dyspnea.   I have reviewed the patient's family social and past medical history and updated as appropriate.   Past Medical History:  Diagnosis Date   Arthritis    Arthritis -DDD."chronic pain med used"   Bladder tumor    states that it was cancer   Cancer ([place])    Bladder cancer"-Dr.Ottelin- checks every 3 months--no signs last checks.   Chronic knee pain RIGHT KNEE --  S/P KNEE REPLACEMENT JAN 2012--  PT STATES  NEEDS ANOTHER REPLACEMENT DUE TO JOINT RECALL   Complication of anesthesia EMERGENCE DELIRIUM   occurred x1- 2 yrs ago.few surgeries ago, none recently   COPD (chronic obstructive pulmonary disease) ([place])    Coronary artery disease    Depression    DM type 2 (diabetes mellitus, type 2) ([facility])    GERD (gastroesophageal reflux disease)    H/O hiatal hernia    Headache    History of bladder cancer [facility]CC OF BLADDER  --  FOLLOWED BY DR Karsten Ro   History of peptic ulcer AGE 38   Hypertension    pt denies, takes lisimopril for kidneys   Mild obstructive sleep apnea    per study 09-26-2007--  no cpap rx   Nocturia    Peripheral neuropathy BOTTOM RIGHT FOOT   Seasonal allergies    Short of breath on exertion    Urgency of urination     Past Surgical History:  Procedure Laterality Date   BACK SURGERY  X2   lower back   BILATERAL KNEE  ARTHROSCOPY     BILATERAL SHOULDER SURG.  2003   CARDIAC CATHETERIZATION  04-13-2007   ---- DR Irish Lack   NO SIGNIFICANT CAD/ NORMAL LVF   CARPAL TUNNEL RELEASE  04/11/2006   RIGHT   COLONOSCOPY WITH PROPOFOL N/A 05/01/2015   Procedure: COLONOSCOPY WITH PROPOFOL;  Surgeon: Garlan Fair, MD;  Location: [facility];  Service: Endoscopy;  Laterality: N/A;   CYSTOSCOPY W/ RETROGRADES Bilateral 12/23/2019   Procedure: CYSTOSCOPY WITH RETROGRADE PYELOGRAM;  Surgeon: Franchot Gallo, MD;  Location: [facility];  Service: Urology;  Laterality: Bilateral;   HAND SURGERY Right    cyst removed   LEFT CARPAL. TUNNEL RELEASE     LEFT EYE SURG.   2008   REMOVAL OF BB   REPAIR RECURRENT LEFT ROTATOR CUFF TEAR  01/02/2011   REVERSE SHOULDER ARTHROPLASTY Left 04/08/2018   Procedure: REVERSE SHOULDER ARTHROPLASTY;  Surgeon: Nicholes Stairs, MD;  Location: [facility];  Service: Orthopedics;  Laterality: Left;  2.5 hours   SIGMOID COLECTOMY  09/29/2001   DIVERTICULITIS   TOTAL KNEE ARTHROPLASTY  04/03/2010   RIGHT   TRANSURETHRAL RESECTION OF BLADDER TUMOR  07/06/2010   TRANSURETHRAL RESECTION OF BLADDER TUMOR  05/20/2011   Procedure:  TRANSURETHRAL RESECTION OF BLADDER TUMOR (TURBT);  Surgeon: Claybon Jabs, MD;  Location: [facility];  Service: Urology;  Laterality: N/A;  GYRUS INSTILL MYTOMYCIN C NO BED   TRANSURETHRAL RESECTION OF BLADDER TUMOR  01/31/2012   Procedure: TRANSURETHRAL RESECTION OF BLADDER TUMOR (TURBT);  Surgeon: Claybon Jabs, MD;  [facility];  Service: Urology;  Laterality: N/A;   TRANSURETHRAL RESECTION OF BLADDER TUMOR WITH GYRUS (TURBT-GYRUS) N/A 11/22/2013   Procedure: Bladder biopsy with fulgeration;  Surgeon: Claybon Jabs, MD;  Location: [facility];  Service: Urology;  Laterality: N/A;   TRANSURETHRAL RESECTION OF BLADDER TUMOR WITH MITOMYCIN-C N/A 12/23/2019   Procedure: TRANSURETHRAL RESECTION  OF BLADDER TUMOR WITH POST-OP  GEMCITABINE;  Surgeon: Franchot Gallo, MD;  Location: [facility];  Service: Urology;  Laterality: N/A;  64 MINS    Family History  Problem Relation Age of Onset   Heart attack Mother    Lung cancer Nephew    Colon cancer Neg Hx    Rectal cancer Neg Hx    Stomach cancer Neg Hx     Social History   Occupational History   Occupation: retired    Comment: [employer] business  Tobacco Use   Smoking status: Every Day    Packs/day: 1.50    Years: 60.00    Pack years: 90.00    Types: Cigarettes   Smokeless tobacco: Never  Vaping Use   Vaping Use: Never used  Substance and Sexual Activity   Alcohol use: No    Alcohol/week: 0.0 standard drinks   Drug use: No   Sexual activity: Not on file     Physical Exam: Blood pressure 122/72, pulse 84, temperature 98.5 F (36.9 C), temperature source Oral, height 5' 9.5" (1.765 m), weight 258 lb (117 kg), SpO2 95 %.  Gen:      No acute distress ENT:  no nasal polyps, mucus membranes moist Lungs:    No increased respiratory effort, symmetric chest wall excursion, clear to auscultation bilaterally, no wheezes or crackles CV:         Regular rate and rhythm; no murmurs, rubs, or gallops.  No pedal edema   Data Reviewed: Imaging: I have personally reviewed the chest xray Nov 2021 - no acute process  PFTs:  PFT Results Latest Ref Rng & Units 02/02/2021  FVC-Pre L 3.54  FVC-Predicted Pre % 82  FVC-Post L 3.68  FVC-Predicted Post % 85  Pre FEV1/FVC % % 69  Post FEV1/FCV % % 71  FEV1-Pre L 2.43  FEV1-Predicted Pre % 77  FEV1-Post L 2.60  DLCO uncorrected ml/min/mmHg 22.86  DLCO UNC% % 90  DLCO corrected ml/min/mmHg 22.73  DLCO COR %Predicted % 89  DLVA Predicted % 90  TLC L 7.28  TLC % Predicted % 105  RV % Predicted % 156   I have personally reviewed the patient's PFTs and mild airflow limitation without BD response.+ air trapping. Normal dlco.   Labs: Lab Results  Component  Value Date   WBC 6.8 12/27/2020   HGB 14.8 12/27/2020   HCT 44.3 12/27/2020   MCV 85.5 12/27/2020   PLT 201.0 12/27/2020    Immunization status: Immunization History  Administered Date(s) Administered   Fluad Quad(high Dose 65+) 01/18/2021   Influenza, High Dose Seasonal PF 01/12/2018   Influenza,inj,quad, With Preservative 01/12/2018   Moderna Sars-Covid-2 Vaccination 04/02/2019, 05/03/2019     Assessment:  COPD with mild airflow limitation FEV1 77% of predicted  Plan/Recommendations: Continue wellbutrin Continue prn albuterol Proceed with HSAT. Will obtain ONO in the meantime  Stop anoro/symbicort. Start breztri 2 puff BID. Gargle after use.  Ambulatory desaturation today. Did not drop below 94%   Return to Care: Return in about 3 months (around 05/09/2021).   Lenice Llamas, MD Pulmonary and [place]

## 2021-02-08 NOTE — Addendum Note (Signed)
Addended by: Dessie Coma on: 02/08/2021 03:28 PM   Modules accepted: Orders

## 2021-02-08 NOTE — Patient Instructions (Addendum)
Please schedule follow up scheduled with myself in 3 months.  If my schedule is not open yet, we will contact you with a reminder closer to that time.  Stop anoro. Stop symbicort.  Start breztri 2 puffs in the morning, 2 puffs at night.   Keep up the good work with cutting back on smoking.   We will prescribe overnight oximetry to check oxygen levels at night until you get your sleep study.   We walked you around today to check your oxygen levels.

## 2021-02-09 ENCOUNTER — Other Ambulatory Visit (HOSPITAL_COMMUNITY): Payer: Self-pay

## 2021-02-09 ENCOUNTER — Telehealth: Payer: Self-pay | Admitting: Internal Medicine

## 2021-02-09 MED ORDER — BREZTRI AEROSPHERE 160-9-4.8 MCG/ACT IN AERO: 2.0000 | INHALATION_SPRAY | Freq: Two times a day (BID) | RESPIRATORY_TRACT | 0 refills | Status: DC

## 2021-02-09 NOTE — Addendum Note (Signed)
Addended by: Dessie Coma on: 02/09/2021 10:07 AM   Modules accepted: Orders

## 2021-02-09 NOTE — Telephone Encounter (Signed)
Remonia Richter, CPhT to [facility]     3:08 PM Called the patient's [place] for recent copay on Brezitri, which was $154.25.  RTC for Trelegy and copay is $151.11.  The copays are high because the pt. is in the coverage gap (donut hole).  There is still a remaining deductible balance of $604.45.   I called and made pt aware of what the pharm stated above. He states that he still has some Anoro left over and asks if he can just use this until out of the coverage gap. Please advise if this is okay, thanks!

## 2021-02-09 NOTE — Telephone Encounter (Signed)
Yes fine to keep using anoro. Can also give him a sample of breztri for him to pick up - 2 weeks worth

## 2021-02-09 NOTE — Telephone Encounter (Signed)
Spoke with the pt  He is aware of response from Dr Shearon Stalls  Samples of Breztri up front for pick up

## 2021-02-09 NOTE — Telephone Encounter (Signed)
Will route over to the pharmacy to see what other inhalers are covered by the insurance.   thanks

## 2021-02-13 ENCOUNTER — Encounter: Payer: Self-pay | Admitting: Internal Medicine

## 2021-02-19 DIAGNOSIS — M47817 Spondylosis without myelopathy or radiculopathy, lumbosacral region: Secondary | ICD-10-CM | POA: Diagnosis not present

## 2021-02-19 DIAGNOSIS — Z79891 Long term (current) use of opiate analgesic: Secondary | ICD-10-CM | POA: Diagnosis not present

## 2021-02-19 DIAGNOSIS — G894 Chronic pain syndrome: Secondary | ICD-10-CM | POA: Diagnosis not present

## 2021-02-19 DIAGNOSIS — M25561 Pain in right knee: Secondary | ICD-10-CM | POA: Diagnosis not present

## 2021-02-19 DIAGNOSIS — M62830 Muscle spasm of back: Secondary | ICD-10-CM | POA: Diagnosis not present

## 2021-02-23 DIAGNOSIS — G5632 Lesion of radial nerve, left upper limb: Secondary | ICD-10-CM | POA: Diagnosis not present

## 2021-02-23 DIAGNOSIS — G5602 Carpal tunnel syndrome, left upper limb: Secondary | ICD-10-CM | POA: Diagnosis not present

## 2021-03-08 DIAGNOSIS — M79602 Pain in left arm: Secondary | ICD-10-CM | POA: Diagnosis not present

## 2021-03-13 ENCOUNTER — Telehealth: Payer: Self-pay | Admitting: Internal Medicine

## 2021-03-14 NOTE — Telephone Encounter (Signed)
Spoke to patient, who is requesting ONO results.   Spoke to [place] with [facility] and requested ONO report to be faxed to our office.  Dr. Shearon Stalls, please advise. thanks

## 2021-03-19 DIAGNOSIS — E1142 Type 2 diabetes mellitus with diabetic polyneuropathy: Secondary | ICD-10-CM | POA: Diagnosis not present

## 2021-03-19 DIAGNOSIS — Z794 Long term (current) use of insulin: Secondary | ICD-10-CM | POA: Diagnosis not present

## 2021-03-19 DIAGNOSIS — E1165 Type 2 diabetes mellitus with hyperglycemia: Secondary | ICD-10-CM | POA: Diagnosis not present

## 2021-03-19 DIAGNOSIS — E669 Obesity, unspecified: Secondary | ICD-10-CM | POA: Diagnosis not present

## 2021-03-19 DIAGNOSIS — Z720 Tobacco use: Secondary | ICD-10-CM | POA: Diagnosis not present

## 2021-03-20 DIAGNOSIS — M79602 Pain in left arm: Secondary | ICD-10-CM | POA: Diagnosis not present

## 2021-03-20 DIAGNOSIS — M79632 Pain in left forearm: Secondary | ICD-10-CM | POA: Diagnosis not present

## 2021-03-23 DIAGNOSIS — F17200 Nicotine dependence, unspecified, uncomplicated: Secondary | ICD-10-CM | POA: Diagnosis not present

## 2021-03-23 DIAGNOSIS — C679 Malignant neoplasm of bladder, unspecified: Secondary | ICD-10-CM | POA: Diagnosis not present

## 2021-03-23 DIAGNOSIS — I10 Essential (primary) hypertension: Secondary | ICD-10-CM | POA: Diagnosis not present

## 2021-03-23 DIAGNOSIS — E782 Mixed hyperlipidemia: Secondary | ICD-10-CM | POA: Diagnosis not present

## 2021-03-23 DIAGNOSIS — M159 Polyosteoarthritis, unspecified: Secondary | ICD-10-CM | POA: Diagnosis not present

## 2021-03-23 DIAGNOSIS — Z0000 Encounter for general adult medical examination without abnormal findings: Secondary | ICD-10-CM | POA: Diagnosis not present

## 2021-03-23 DIAGNOSIS — N528 Other male erectile dysfunction: Secondary | ICD-10-CM | POA: Diagnosis not present

## 2021-03-23 DIAGNOSIS — I700 Atherosclerosis of aorta: Secondary | ICD-10-CM | POA: Diagnosis not present

## 2021-03-23 DIAGNOSIS — E1142 Type 2 diabetes mellitus with diabetic polyneuropathy: Secondary | ICD-10-CM | POA: Diagnosis not present

## 2021-03-23 DIAGNOSIS — J449 Chronic obstructive pulmonary disease, unspecified: Secondary | ICD-10-CM | POA: Diagnosis not present

## 2021-03-23 DIAGNOSIS — F3341 Major depressive disorder, recurrent, in partial remission: Secondary | ICD-10-CM | POA: Diagnosis not present

## 2021-03-26 DIAGNOSIS — M79632 Pain in left forearm: Secondary | ICD-10-CM | POA: Diagnosis not present

## 2021-04-02 DIAGNOSIS — M25512 Pain in left shoulder: Secondary | ICD-10-CM | POA: Diagnosis not present

## 2021-04-02 DIAGNOSIS — M25522 Pain in left elbow: Secondary | ICD-10-CM | POA: Diagnosis not present

## 2021-04-03 ENCOUNTER — Telehealth: Payer: Self-pay | Admitting: Acute Care

## 2021-04-03 NOTE — Telephone Encounter (Signed)
Spoke with patient regarding [facility] referral.  Discussed importance of annual LDCT for early detection of lung cancer due to higher risk in patients with a smoking history.  Patient states he is not interested and would prefer we close the referral and not communicate further.Referral closed and referring provider routed message for update.

## 2021-04-04 ENCOUNTER — Telehealth: Payer: Self-pay | Admitting: Internal Medicine

## 2021-04-04 NOTE — Telephone Encounter (Signed)
Dr. Shearon Stalls, please advise if you have received results of pt's ONO.

## 2021-04-05 ENCOUNTER — Other Ambulatory Visit: Payer: Self-pay | Admitting: Physician Assistant

## 2021-04-05 NOTE — Telephone Encounter (Signed)
I called pt he is now sched. Nothing further needed.

## 2021-04-05 NOTE — Telephone Encounter (Signed)
I have received his ONO. Does not require home oxygen at night but sleep study was recommended. I have ordered HSAT - have told him he will be contacted by [facility] once this is ready.

## 2021-04-05 NOTE — Telephone Encounter (Signed)
Estill Bamberg called him on 04/04/2021 to schedule

## 2021-04-05 NOTE — Telephone Encounter (Signed)
Do see the order for HST that was placed. HSTs are currently 10-12 weeks out before able to be scheduled. PCCs will contact pt once they are able to get him in for appt.

## 2021-04-09 DIAGNOSIS — M25522 Pain in left elbow: Secondary | ICD-10-CM | POA: Diagnosis not present

## 2021-04-09 DIAGNOSIS — M25512 Pain in left shoulder: Secondary | ICD-10-CM | POA: Diagnosis not present

## 2021-04-16 ENCOUNTER — Other Ambulatory Visit: Payer: Self-pay

## 2021-04-16 ENCOUNTER — Ambulatory Visit: Payer: PPO

## 2021-04-16 DIAGNOSIS — G4733 Obstructive sleep apnea (adult) (pediatric): Secondary | ICD-10-CM | POA: Diagnosis not present

## 2021-04-16 DIAGNOSIS — M62830 Muscle spasm of back: Secondary | ICD-10-CM | POA: Diagnosis not present

## 2021-04-16 DIAGNOSIS — M47817 Spondylosis without myelopathy or radiculopathy, lumbosacral region: Secondary | ICD-10-CM | POA: Diagnosis not present

## 2021-04-16 DIAGNOSIS — G894 Chronic pain syndrome: Secondary | ICD-10-CM | POA: Diagnosis not present

## 2021-04-16 DIAGNOSIS — M25561 Pain in right knee: Secondary | ICD-10-CM | POA: Diagnosis not present

## 2021-04-25 DIAGNOSIS — G4733 Obstructive sleep apnea (adult) (pediatric): Secondary | ICD-10-CM | POA: Diagnosis not present

## 2021-04-27 NOTE — Telephone Encounter (Signed)
ONO was received and pt was made aware of results.

## 2021-05-03 DIAGNOSIS — M1712 Unilateral primary osteoarthritis, left knee: Secondary | ICD-10-CM | POA: Diagnosis not present

## 2021-06-04 ENCOUNTER — Encounter: Payer: Self-pay | Admitting: Pulmonary Disease

## 2021-06-04 ENCOUNTER — Ambulatory Visit: Payer: PPO | Admitting: Pulmonary Disease

## 2021-06-04 ENCOUNTER — Other Ambulatory Visit: Payer: Self-pay

## 2021-06-04 VITALS — BP 138/80 | HR 89 | Temp 98.9°F | Ht 70.0 in | Wt 266.4 lb

## 2021-06-04 DIAGNOSIS — G894 Chronic pain syndrome: Secondary | ICD-10-CM | POA: Diagnosis not present

## 2021-06-04 DIAGNOSIS — G4733 Obstructive sleep apnea (adult) (pediatric): Secondary | ICD-10-CM

## 2021-06-04 DIAGNOSIS — M47817 Spondylosis without myelopathy or radiculopathy, lumbosacral region: Secondary | ICD-10-CM | POA: Diagnosis not present

## 2021-06-04 DIAGNOSIS — R0609 Other forms of dyspnea: Secondary | ICD-10-CM | POA: Diagnosis not present

## 2021-06-04 DIAGNOSIS — J449 Chronic obstructive pulmonary disease, unspecified: Secondary | ICD-10-CM

## 2021-06-04 DIAGNOSIS — M25561 Pain in right knee: Secondary | ICD-10-CM | POA: Diagnosis not present

## 2021-06-04 DIAGNOSIS — M62830 Muscle spasm of back: Secondary | ICD-10-CM | POA: Diagnosis not present

## 2021-06-04 NOTE — Patient Instructions (Addendum)
? ?  X Ambulatory sat ? ?X CPAP titration study ? ?X schedule echocardiogram ? ?Use nicotine patch  21 mg the day of study ? ? ?

## 2021-06-04 NOTE — Assessment & Plan Note (Signed)
He is convinced that he needs oxygen.  He desaturates mildly on walking but not to the point of requiring oxygen.  Surprisingly PFTs did not show significant airway obstruction or even restriction.  DLCO is normal.  This makes ILD or pulmonary hypertension less likely. ?He may need cardiac evaluation for CAD.  We will start with an echocardiogram. ? ?

## 2021-06-04 NOTE — Assessment & Plan Note (Signed)
He has mild OSA, worse during supine sleep ?Desaturations seems significantly worse than would be expected for this degree of OSA. ?I explained to him that the only way he would qualify for oxygen was via CPAP titration study.  This will enable [place] to see whether events/hypoxia is controlled by CPAP or whether additional oxygen is required. ?He had a lot of reservations about using CPAP, his wife uses one.  We discussed various kinds of mask interfaces.  He would not qualify for hypoglossal nerve implant ?

## 2021-06-04 NOTE — Progress Notes (Signed)
? ?Subjective:  ? ? Patient ID: Michael Horn, male    DOB: 1950/04/27, 71 y.o.   MRN: [id number] ? ?HPI ? ?71 year old smoker presents for evaluation of sleep disordered breathing. ?He smoked 2 packs/day, more than 60 pack years.  He is convinced that he needs oxygen.  He was evaluated by my partner Dr. Shearon Stalls but feels that his primary concerns were not addressed.  I note that PFTs were normal, he was initially on Anoro and this was changed to Alicia.  He continues to complain about dyspnea on exertion which has been ongoing for 2 years.  Dyspnea also occurs at night and sitting up makes this better.  Nocturnal oximetry was obtained which showed a bilateral desaturation consistent with sleep apnea.  Home sleep study was obtained ?He is accompanied by his spouse Lenna Sciara today who corroborates history.  He sleeps in a recliner until midnight and then gets up and gets into the bed, he sleeps on his side with 2 pillows, reports multiple awakenings and will occasionally get up to smoke, out of bed at 7 AM feeling tired without dryness of mouth or headaches. ?He he is often dozing during the day ?Epworth sleepiness score is 10 ?There is no history suggestive of cataplexy, sleep paralysis or parasomnias ? ?On ambulation saturation dropped to 92% and heart rate increased from 80 to 115 ? ?Significant tests/ events reviewed ? ?PFTs 01/2021 showed ratio 69, FEV1 77%, FVC 82%, TLC normal, DLCO 90%. ? ?HST 04/2021 >> AHI 7/hour, 15/hour when supine, lowest desaturation 77% ? ? ? ?Past Medical History:  ?Diagnosis Date  ? Arthritis   ? Arthritis -DDD."chronic pain med used"  ? Bladder tumor   ? states that it was cancer  ? Cancer [facility])   ? Bladder cancer"-Dr.Ottelin- checks every 3 months--no signs last checks.  ? Chronic knee pain RIGHT KNEE --  S/P KNEE REPLACEMENT JAN 2012--  PT STATES  NEEDS ANOTHER REPLACEMENT DUE TO JOINT RECALL  ? Complication of anesthesia EMERGENCE DELIRIUM  ? occurred x1- 2 yrs ago.few surgeries  ago, none recently  ? COPD (chronic obstructive pulmonary disease) ([place])   ? Coronary artery disease   ? Depression   ? DM type 2 (diabetes mellitus, type 2) ([place])   ? GERD (gastroesophageal reflux disease)   ? H/O hiatal hernia   ? Headache   ? History of bladder cancer [facility] OF BLADDER  --  FOLLOWED BY DR Karsten Ro  ? History of peptic ulcer AGE 20  ? Hypertension   ? pt denies, takes lisimopril for kidneys  ? Mild obstructive sleep apnea   ? per study 09-26-2007--  no cpap rx  ? Nocturia   ? Peripheral neuropathy BOTTOM RIGHT FOOT  ? Seasonal allergies   ? Short of breath on exertion   ? Urgency of urination   ? ?Past Surgical History:  ?Procedure Laterality Date  ? BACK SURGERY  X2  ? lower back  ? BILATERAL KNEE ARTHROSCOPY    ? BILATERAL SHOULDER SURG.  2003  ? CARDIAC CATHETERIZATION  04-13-2007   ---- DR Irish Lack  ? NO SIGNIFICANT CAD/ NORMAL LVF  ? CARPAL TUNNEL RELEASE  04/11/2006  ? RIGHT  ? COLONOSCOPY WITH PROPOFOL N/A 05/01/2015  ? Procedure: COLONOSCOPY WITH PROPOFOL;  Surgeon: Garlan Fair, MD;  Location: [facility];  Service: Endoscopy;  Laterality: N/A;  ? CYSTOSCOPY W/ RETROGRADES Bilateral 12/23/2019  ? Procedure: CYSTOSCOPY WITH RETROGRADE PYELOGRAM;  Surgeon: Franchot Gallo, MD;  Location: [facility];  Service: Urology;  Laterality: Bilateral;  ? HAND SURGERY Right   ? cyst removed  ? LEFT CARPAL. TUNNEL RELEASE    ? LEFT EYE SURG.   2008  ? REMOVAL OF BB  ? REPAIR RECURRENT LEFT ROTATOR CUFF TEAR  01/02/2011  ? REVERSE SHOULDER ARTHROPLASTY Left 04/08/2018  ? Procedure: REVERSE SHOULDER ARTHROPLASTY;  Surgeon: Nicholes Stairs, MD;  Location: [place];  Service: Orthopedics;  Laterality: Left;  2.5 hours  ? SIGMOID COLECTOMY  09/29/2001  ? DIVERTICULITIS  ? TOTAL KNEE ARTHROPLASTY  04/03/2010  ? RIGHT  ? TRANSURETHRAL RESECTION OF BLADDER TUMOR  07/06/2010  ? TRANSURETHRAL RESECTION OF BLADDER TUMOR  05/20/2011  ? Procedure: TRANSURETHRAL RESECTION OF BLADDER TUMOR  (TURBT);  Surgeon: Claybon Jabs, MD;  Location: [facility];  Service: Urology;  Laterality: N/A;  GYRUS ?INSTILL MYTOMYCIN C ?NO BED  ? TRANSURETHRAL RESECTION OF BLADDER TUMOR  01/31/2012  ? Procedure: TRANSURETHRAL RESECTION OF BLADDER TUMOR (TURBT);  Surgeon: Claybon Jabs, MD;  Location: [facility];  Service: Urology;  Laterality: N/A;  ? TRANSURETHRAL RESECTION OF BLADDER TUMOR WITH GYRUS (TURBT-GYRUS) N/A 11/22/2013  ? Procedure: Bladder biopsy with fulgeration;  Surgeon: Claybon Jabs, MD;  Location: [facility];  Service: Urology;  Laterality: N/A;  ? TRANSURETHRAL RESECTION OF BLADDER TUMOR WITH MITOMYCIN-C N/A 12/23/2019  ? Procedure: TRANSURETHRAL RESECTION OF BLADDER TUMOR WITH POST-OP  GEMCITABINE;  Surgeon: Franchot Gallo, MD;  Location: [facility];  Service: Urology;  Laterality: N/A;  94 MINS  ? ? ?Allergies  ?Allergen Reactions  ? Contrast Media [Iodinated Contrast Media] Rash  ? ? ?Social History  ? ?Socioeconomic History  ? Marital status: Married  ?  Spouse name: Delrae Rend  ? Number of children: 4  ? Years of education: Not on file  ? Highest education level: Not on file  ?Occupational History  ? Occupation: retired  ?  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 Smoking status: Every Day  ?  Packs/day: 1.50  ?  Years: 60.00  ?  Pack years: 90.00  ?  Types: Cigarettes  ? Smokeless tobacco: Never  ? Tobacco comments:  ?  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 Vaping Use: Never used  ?Substance and Sexual Activity  ? Alcohol use: No  ?  Alcohol/week: 0.0 standard drinks  ? Drug use: No  ? Sexual activity: Not on file  ?Other Topics Concern  ? Not on file  ?Social History Narrative  ? Not on file  ? ?Social Determinants of Health  ? ?Financial Resource Strain: Not on file  ?Food Insecurity: Not on file  ?Transportation Needs: Not on file  ?Physical Activity: Not on file  ?Stress: Not on file  ?Social Connections:  Not on file  ?Intimate Partner Violence: Not on file  ? ? ?Family History  ?Problem Relation Age of Onset  ? Heart attack Mother   ? Lung cancer Nephew   ? Colon cancer Neg Hx   ? Rectal cancer Neg Hx   ? Stomach cancer Neg Hx   ? ? ? ? ?Review of Systems ?Constitutional: negative for anorexia, fevers and sweats  ?Eyes: negative for irritation, redness and visual disturbance  ?Ears, nose, mouth, throat, and face: negative for earaches, epistaxis, nasal congestion and sore throat  ? ?Cardiovascular: negative for chest pain, lower extremity edema, orthopnea, palpitations and syncope  ?Gastrointestinal: negative for abdominal pain, constipation,  diarrhea, melena, nausea and vomiting  ?Genitourinary:negative for dysuria, frequency and hematuria  ?Hematologic/lymphatic: negative for bleeding, easy bruising and lymphadenopathy  ?Musculoskeletal:negative for arthralgias, muscle weakness and stiff joints  ?Neurological: negative for coordination problems, gait problems, headaches and weakness  ?Endocrine: negative for diabetic symptoms including polydipsia, polyuria and weight loss ? ?   ?Objective:  ? Physical Exam ? ?Gen. Pleasant, obese, in no distress, normal affect ?ENT - no pallor,icterus, no post nasal drip, class 2-3 airway ?Neck: No JVD, no thyromegaly, no carotid bruits ?Lungs: no use of accessory muscles, no dullness to percussion, decreased without rales or rhonchi  ?Cardiovascular: Rhythm regular, heart sounds  normal, no murmurs or gallops, no peripheral edema ?Abdomen: soft and non-tender, no hepatosplenomegaly, BS normal. ?Musculoskeletal: No deformities, no cyanosis or clubbing ?Neuro:  alert, non focal, no tremors ? ? ? ?   ?Assessment & Plan:  ? ? ?

## 2021-06-20 ENCOUNTER — Ambulatory Visit (HOSPITAL_COMMUNITY)
Admission: RE | Admit: 2021-06-20 | Discharge: 2021-06-20 | Disposition: A | Discharge status: Home / Self Care | Payer: PPO | Source: Ambulatory Visit | Attending: Family Medicine | Admitting: Family Medicine

## 2021-06-20 DIAGNOSIS — E119 Type 2 diabetes mellitus without complications: Secondary | ICD-10-CM | POA: Insufficient documentation

## 2021-06-20 DIAGNOSIS — I10 Essential (primary) hypertension: Secondary | ICD-10-CM | POA: Diagnosis not present

## 2021-06-20 DIAGNOSIS — J449 Chronic obstructive pulmonary disease, unspecified: Secondary | ICD-10-CM | POA: Insufficient documentation

## 2021-06-20 DIAGNOSIS — R0609 Other forms of dyspnea: Secondary | ICD-10-CM

## 2021-06-20 DIAGNOSIS — R0600 Dyspnea, unspecified: Secondary | ICD-10-CM | POA: Diagnosis not present

## 2021-06-20 DIAGNOSIS — I2510 Atherosclerotic heart disease of native coronary artery without angina pectoris: Secondary | ICD-10-CM | POA: Insufficient documentation

## 2021-06-20 LAB — ECHOCARDIOGRAM COMPLETE
Area-P 1/2: 3.83 cm2
S' Lateral: 2.63 cm

## 2021-06-25 DIAGNOSIS — H02831 Dermatochalasis of right upper eyelid: Secondary | ICD-10-CM | POA: Diagnosis not present

## 2021-06-25 DIAGNOSIS — H02834 Dermatochalasis of left upper eyelid: Secondary | ICD-10-CM | POA: Diagnosis not present

## 2021-07-09 ENCOUNTER — Ambulatory Visit (HOSPITAL_BASED_OUTPATIENT_CLINIC_OR_DEPARTMENT_OTHER): Payer: PPO | Attending: Pulmonary Disease | Admitting: Pulmonary Disease

## 2021-07-09 DIAGNOSIS — G4733 Obstructive sleep apnea (adult) (pediatric): Secondary | ICD-10-CM | POA: Diagnosis not present

## 2021-07-10 DIAGNOSIS — G4733 Obstructive sleep apnea (adult) (pediatric): Secondary | ICD-10-CM | POA: Diagnosis not present

## 2021-07-10 NOTE — Procedures (Signed)
Patient Name: Michael Horn, Michael Horn ?Study Date: 07/09/2021 ?Gender: Male ?D.O.B: 04/14/1950 ?Age (years): 89 ?Referring Provider: Kara Mead MD, ABSM ?Height (inches): 70 ?Interpreting Physician: Kara Mead MD, ABSM ?Weight (lbs): 365 ?RPSGT: Steffey, Lennette Bihari ?BMI: 52 ?MRN: [id number] ?Neck Size: 20.00 ?<br> <br> ?CLINICAL INFORMATION ?The patient is referred for a CPAP titration to treat sleep apnea. ? ? ? ?Date of  HST: 04/2021 >> AHI 7/hour, 15/hour when supine, lowest desaturation 77% ? ?SLEEP STUDY TECHNIQUE ?As per the AASM Manual for the Scoring of Sleep and Associated Events v2.3 (April 2016) with a hypopnea requiring 4% desaturations. ? ?The channels recorded and monitored were frontal, central and occipital EEG, electrooculogram (EOG), submentalis EMG (chin), nasal and oral airflow, thoracic and abdominal wall motion, anterior tibialis EMG, snore microphone, electrocardiogram, and pulse oximetry. Continuous positive airway pressure (CPAP) was initiated at the beginning of the study and titrated to treat sleep-disordered breathing. ? ?MEDICATIONS ?Medications self-administered by patient taken the night of the study : N/A ? ?RESPIRATORY PARAMETERS ?Optimal PAP Pressure (cm): 15 AHI at Optimal Pressure (/hr): 1.4 ?Overall Minimal O2 (%): 82.0 Supine % at Optimal Pressure (%): 100 ?Minimal O2 at Optimal Pressure (%): 89.0  ? ?SLEEP ARCHITECTURE ?The study was initiated at 10:51:40 PM and ended at 4:54:10 AM. ? ?Sleep onset time was 15.3 minutes and the sleep efficiency was 70.5%%. The total sleep time was 255.5 minutes. ? ?The patient spent 4.9%% of the night in stage N1 sleep, 40.9%% in stage N2 sleep, 0.0%% in stage N3 and 54.2% in REM.Stage REM latency was 39.5 minutes ? ?Wake after sleep onset was 91.7. Alpha intrusion was absent. Supine sleep was 25.44%. ? ?CARDIAC DATA ?The 2 lead EKG demonstrated sinus rhythm. The mean heart rate was 66.7 beats per minute. Other EKG findings include: None. ? ?LEG MOVEMENT  DATA ?The total Periodic Limb Movements of Sleep (PLMS) were 0. The PLMS index was 0.0. A PLMS index of <15 is considered normal in adults. ? ?IMPRESSIONS ?- The optimal PAP pressure was 15 cm of water. ?- Moderate oxygen desaturations were observed during this titration (min O2 = 82.0%). ?- No snoring was audible during this study. ?- No cardiac abnormalities were observed during this study. ?- Clinically significant periodic limb movements were not noted during this study. Arousals associated with PLMs were rare. ? ? ?DIAGNOSIS ?- Obstructive Sleep Apnea (G47.33) ? ? ?RECOMMENDATIONS ?- Trial of CPAP therapy on 15 cm H2O with a Medium size Fisher&Paykel Full Face Vitera mask and heated humidification. ?- Avoid alcohol, sedatives and other CNS depressants that may worsen sleep apnea and disrupt normal sleep architecture. ?- Sleep hygiene should be reviewed to assess factors that may improve sleep quality. ?- Weight management and regular exercise should be initiated or continued. ?- Return to Sleep Center for re-evaluation after 4 weeks of therapy ? ? ?Kara Mead MD ?Board Certified in Sleep medicine ? ?

## 2021-07-12 ENCOUNTER — Other Ambulatory Visit: Payer: Self-pay

## 2021-07-12 DIAGNOSIS — G4733 Obstructive sleep apnea (adult) (pediatric): Secondary | ICD-10-CM

## 2021-07-25 DIAGNOSIS — R079 Chest pain, unspecified: Secondary | ICD-10-CM | POA: Diagnosis not present

## 2021-07-25 DIAGNOSIS — F17200 Nicotine dependence, unspecified, uncomplicated: Secondary | ICD-10-CM | POA: Diagnosis not present

## 2021-07-25 DIAGNOSIS — M791 Myalgia, unspecified site: Secondary | ICD-10-CM | POA: Diagnosis not present

## 2021-07-25 DIAGNOSIS — H9202 Otalgia, left ear: Secondary | ICD-10-CM | POA: Diagnosis not present

## 2021-07-25 DIAGNOSIS — E782 Mixed hyperlipidemia: Secondary | ICD-10-CM | POA: Diagnosis not present

## 2021-08-02 DIAGNOSIS — G894 Chronic pain syndrome: Secondary | ICD-10-CM | POA: Diagnosis not present

## 2021-08-02 DIAGNOSIS — M47817 Spondylosis without myelopathy or radiculopathy, lumbosacral region: Secondary | ICD-10-CM | POA: Diagnosis not present

## 2021-08-02 DIAGNOSIS — Z79891 Long term (current) use of opiate analgesic: Secondary | ICD-10-CM | POA: Diagnosis not present

## 2021-08-02 DIAGNOSIS — M25561 Pain in right knee: Secondary | ICD-10-CM | POA: Diagnosis not present

## 2021-08-02 DIAGNOSIS — M62830 Muscle spasm of back: Secondary | ICD-10-CM | POA: Diagnosis not present

## 2021-08-08 DIAGNOSIS — M79643 Pain in unspecified hand: Secondary | ICD-10-CM | POA: Diagnosis not present

## 2021-08-08 DIAGNOSIS — M25462 Effusion, left knee: Secondary | ICD-10-CM | POA: Diagnosis not present

## 2021-08-08 DIAGNOSIS — R768 Other specified abnormal immunological findings in serum: Secondary | ICD-10-CM | POA: Diagnosis not present

## 2021-08-08 DIAGNOSIS — M25561 Pain in right knee: Secondary | ICD-10-CM | POA: Diagnosis not present

## 2021-08-08 DIAGNOSIS — M79642 Pain in left hand: Secondary | ICD-10-CM | POA: Diagnosis not present

## 2021-08-08 DIAGNOSIS — M1990 Unspecified osteoarthritis, unspecified site: Secondary | ICD-10-CM | POA: Diagnosis not present

## 2021-08-08 DIAGNOSIS — M79641 Pain in right hand: Secondary | ICD-10-CM | POA: Diagnosis not present

## 2021-08-22 ENCOUNTER — Encounter: Payer: Self-pay | Admitting: Interventional Cardiology

## 2021-08-22 ENCOUNTER — Ambulatory Visit: Payer: PPO | Admitting: Interventional Cardiology

## 2021-08-22 VITALS — BP 132/70 | HR 80 | Ht 70.0 in | Wt 265.0 lb

## 2021-08-22 DIAGNOSIS — Z794 Long term (current) use of insulin: Secondary | ICD-10-CM

## 2021-08-22 DIAGNOSIS — R079 Chest pain, unspecified: Secondary | ICD-10-CM

## 2021-08-22 DIAGNOSIS — E782 Mixed hyperlipidemia: Secondary | ICD-10-CM | POA: Diagnosis not present

## 2021-08-22 DIAGNOSIS — E1169 Type 2 diabetes mellitus with other specified complication: Secondary | ICD-10-CM

## 2021-08-22 NOTE — Patient Instructions (Signed)
Medication Instructions:  Your physician recommends that you continue on your current medications as directed. Please refer to the Current Medication list given to you today.  *If you need a refill on your cardiac medications before your next appointment, please call your pharmacy*   Lab Work: none If you have labs (blood work) drawn today and your tests are completely normal, you will receive your results only by: [place] (if you have MyChart) OR A paper copy in the mail If you have any lab test that is abnormal or we need to change your treatment, we will call you to review the results.   Testing/Procedures: none   Follow-Up: At [facility], you and your health needs are our priority.  As part of our continuing mission to provide you with exceptional heart care, we have created designated Provider Care Teams.  These Care Teams include your primary Cardiologist (physician) and Advanced Practice Providers (APPs -  Physician Assistants and Nurse Practitioners) who all work together to provide you with the care you need, when you need it.  We recommend signing up for the patient portal called "MyChart".  Sign up information is provided on this After Visit Summary.  MyChart is used to connect with patients for Virtual Visits (Telemedicine).  Patients are able to view lab/test results, encounter notes, upcoming appointments, etc.  Non-urgent messages can be sent to your provider as well.   To learn more about what you can do with MyChart, go to [email or web address].    Your next appointment:   As needed  The format for your next appointment:   In Person  Provider:   Larae Grooms, MD     Other Instructions  High-Fiber Eating Plan Fiber, also called dietary fiber, is a type of carbohydrate. It is found foods such as fruits, vegetables, whole grains, and beans. A high-fiber diet can have many health benefits. Your health care provider may recommend a high-fiber diet to  help: Prevent constipation. Fiber can make your bowel movements more regular. Lower your cholesterol. Relieve the following conditions: Inflammation of veins in the anus (hemorrhoids). Inflammation of specific areas of the digestive tract (uncomplicated diverticulosis). A problem of the large intestine, also called the colon, that sometimes causes pain and diarrhea (irritable bowel syndrome, or IBS). Prevent overeating as part of a weight-loss plan. Prevent heart disease, type 2 diabetes, and certain cancers. What are tips for following this plan? Reading food labels  Check the nutrition facts label on food products for the amount of dietary fiber. Choose foods that have 5 grams of fiber or more per serving. The goals for recommended daily fiber intake include: Men (age 3 or younger): 34-38 g. Men (over age 70): 28-34 g. Women (age 18 or younger): 25-28 g. Women (over age 46): 22-25 g. Your daily fiber goal is _____________ g. Shopping Choose whole fruits and vegetables instead of processed forms, such as apple juice or applesauce. Choose a wide variety of high-fiber foods such as avocados, lentils, oats, and kidney beans. Read the nutrition facts label of the foods you choose. Be aware of foods with added fiber. These foods often have high sugar and sodium amounts per serving. Cooking Use whole-grain flour for baking and cooking. Cook with brown rice instead of white rice. Meal planning Start the day with a breakfast that is high in fiber, such as a cereal that contains 5 g of fiber or more per serving. Eat breads and cereals that are made with whole-grain flour instead of  refined flour or white flour. Eat brown rice, bulgur wheat, or millet instead of white rice. Use beans in place of meat in soups, salads, and pasta dishes. Be sure that half of the grains you eat each day are whole grains. General information You can get the recommended daily intake of dietary fiber by: Eating a  variety of fruits, vegetables, grains, nuts, and beans. Taking a fiber supplement if you are not able to take in enough fiber in your diet. It is better to get fiber through food than from a supplement. Gradually increase how much fiber you consume. If you increase your intake of dietary fiber too quickly, you may have bloating, cramping, or gas. Drink plenty of water to help you digest fiber. Choose high-fiber snacks, such as berries, raw vegetables, nuts, and popcorn. What foods should I eat? Fruits Berries. Pears. Apples. Oranges. Avocado. Prunes and raisins. Dried figs. Vegetables Sweet potatoes. Spinach. Kale. Artichokes. Cabbage. Broccoli. Cauliflower. Green peas. Carrots. Squash. Grains Whole-grain breads. Multigrain cereal. Oats and oatmeal. Brown rice. Barley. Bulgur wheat. [place]. Quinoa. Bran muffins. Popcorn. Rye wafer crackers. Meats and other proteins Navy beans, kidney beans, and pinto beans. Soybeans. Split peas. Lentils. Nuts and seeds. Dairy Fiber-fortified yogurt. Beverages Fiber-fortified soy milk. Fiber-fortified orange juice. Other foods Fiber bars. The items listed above may not be a complete list of recommended foods and beverages. Contact a dietitian for more information. What foods should I avoid? Fruits Fruit juice. Cooked, strained fruit. Vegetables Fried potatoes. Canned vegetables. Well-cooked vegetables. Grains White bread. Pasta made with refined flour. White rice. Meats and other proteins Fatty cuts of meat. Fried chicken or fried fish. Dairy Milk. Yogurt. Cream cheese. Sour cream. Fats and oils Butters. Beverages Soft drinks. Other foods Cakes and pastries. The items listed above may not be a complete list of foods and beverages to avoid. Talk with your dietitian about what choices are best for you. Summary Fiber is a type of carbohydrate. It is found in foods such as fruits, vegetables, whole grains, and beans. A high-fiber diet has many  benefits. It can help to prevent constipation, lower blood cholesterol, aid weight loss, and reduce your risk of heart disease, diabetes, and certain cancers. Increase your intake of fiber gradually. Increasing fiber too quickly may cause cramping, bloating, and gas. Drink plenty of water while you increase the amount of fiber you consume. The best sources of fiber include whole fruits and vegetables, whole grains, nuts, seeds, and beans. This information is not intended to replace advice given to you by your health care provider. Make sure you discuss any questions you have with your health care provider. Document Revised: 07/01/2019 Document Reviewed: 07/01/2019 Elsevier Patient Education  [place]

## 2021-08-22 NOTE — Progress Notes (Signed)
Cardiology Office Note   Date:  08/22/2021   ID:  Michael Horn, DOB 1950/10/21, MRN [id number]  PCP:  Mayra Neer, MD    Chief Complaint  Patient presents with   Establish Care   Chest pain  Wt Readings from Last 3 Encounters:  08/22/21 265 lb (120.2 kg)  07/09/21 265 lb (120.2 kg)  06/04/21 266 lb 6.4 oz (120.8 kg)       History of Present Illness: Michael Horn is a 71 y.o. male who is being seen today for the evaluation of chest pain at the request of Mayra Neer, MD.  Has occasional right sided chest pain.  Happens randomly, no triggers.  Occurs once every few months. Lasts a few minutes. Improves with massaging the area.   Cardiac cath was done in 2009 and did not show any blockages: "No significant coronary artery disease.  The patient is likely      having noncardiac chest pain.  2. Normal ventricular function.  3. No abdominal aortic aneurysm.  4. Normal hemodynamics.  5. No renal artery stenosis"    Echo in 2023 showed: "Left ventricular ejection fraction, by estimation, is 60 to 65%. Left  ventricular ejection fraction by 3D volume is 61 %. The left ventricle has  normal function. The left ventricle has no regional wall motion  abnormalities. There is mild concentric  left ventricular hypertrophy. Left ventricular diastolic parameters are  consistent with Grade I diastolic dysfunction (impaired relaxation).   2. Right ventricular systolic function is normal. The right ventricular  size is mildly enlarged. Tricuspid regurgitation signal is inadequate for  assessing PA pressure.   3. The mitral valve is normal in structure. No evidence of mitral valve  regurgitation. No evidence of mitral stenosis.   4. The aortic valve is tricuspid. Aortic valve regurgitation is not  visualized. Aortic valve sclerosis is present, with no evidence of aortic  valve stenosis.   5. The inferior vena cava is dilated in size with <50% respiratory  variability,  suggesting right atrial pressure of 15 mmHg. "  He feels that the right-sided chest pain that has been going on occurs about once every 3 months and has been going on for years.  It does not alarm him too much.  His walking has been limited by other issues.  He is hoping to do more walking as the weather warms up.  He has had multiple orthopedic issues and multiple orthopedic surgeries in the past.   Past Medical History:  Diagnosis Date   Arthritis    Arthritis -DDD."chronic pain med used"   Bladder tumor    states that it was cancer   Cancer ([place])    Bladder cancer"-Dr.Ottelin- checks every 3 months--no signs last checks.   Chronic knee pain RIGHT KNEE --  S/P KNEE REPLACEMENT JAN 2012--  PT STATES  NEEDS ANOTHER REPLACEMENT DUE TO JOINT RECALL   Complication of anesthesia EMERGENCE DELIRIUM   occurred x1- 2 yrs ago.few surgeries ago, none recently   COPD (chronic obstructive pulmonary disease) ([place])    Coronary artery disease    Depression    DM type 2 (diabetes mellitus, type 2) (HCC)    GERD (gastroesophageal reflux disease)    H/O hiatal hernia    Headache    History of bladder cancer TCC OF BLADDER  --  FOLLOWED BY DR Karsten Ro   History of peptic ulcer AGE 44   Hypertension    pt denies, takes lisimopril for kidneys  Mild obstructive sleep apnea    per study 09-26-2007--  no cpap rx   Nocturia    Peripheral neuropathy BOTTOM RIGHT FOOT   Seasonal allergies    Short of breath on exertion    Urgency of urination     Past Surgical History:  Procedure Laterality Date   BACK SURGERY  X2   lower back   BILATERAL KNEE ARTHROSCOPY     BILATERAL SHOULDER SURG.  2003   CARDIAC CATHETERIZATION  04-13-2007   ---- DR Irish Lack   NO SIGNIFICANT CAD/ NORMAL LVF   CARPAL TUNNEL RELEASE  04/11/2006   RIGHT   COLONOSCOPY WITH PROPOFOL N/A 05/01/2015   Procedure: COLONOSCOPY WITH PROPOFOL;  Surgeon: Garlan Fair, MD;  Location: [facility];  Service: Endoscopy;  Laterality:  N/A;   CYSTOSCOPY W/ RETROGRADES Bilateral 12/23/2019   Procedure: CYSTOSCOPY WITH RETROGRADE PYELOGRAM;  Surgeon: Franchot Gallo, MD;  Location: [facility];  Service: Urology;  Laterality: Bilateral;   HAND SURGERY Right    cyst removed   LEFT CARPAL. TUNNEL RELEASE     LEFT EYE SURG.   2008   REMOVAL OF BB   REPAIR RECURRENT LEFT ROTATOR CUFF TEAR  01/02/2011   REVERSE SHOULDER ARTHROPLASTY Left 04/08/2018   Procedure: REVERSE SHOULDER ARTHROPLASTY;  Surgeon: Nicholes Stairs, MD;  Location: [place];  Service: Orthopedics;  Laterality: Left;  2.5 hours   SIGMOID COLECTOMY  09/29/2001   DIVERTICULITIS   TOTAL KNEE ARTHROPLASTY  04/03/2010   RIGHT   TRANSURETHRAL RESECTION OF BLADDER TUMOR  07/06/2010   TRANSURETHRAL RESECTION OF BLADDER TUMOR  05/20/2011   Procedure: TRANSURETHRAL RESECTION OF BLADDER TUMOR (TURBT);  Surgeon: Claybon Jabs, MD;  Location: [facility];  Service: Urology;  Laterality: N/A;  GYRUS INSTILL MYTOMYCIN C NO BED   TRANSURETHRAL RESECTION OF BLADDER TUMOR  01/31/2012   Procedure: TRANSURETHRAL RESECTION OF BLADDER TUMOR (TURBT);  Surgeon: Claybon Jabs, MD;  Location: [facility];  Service: Urology;  Laterality: N/A;   TRANSURETHRAL RESECTION OF BLADDER TUMOR WITH GYRUS (TURBT-GYRUS) N/A 11/22/2013   Procedure: Bladder biopsy with fulgeration;  Surgeon: Claybon Jabs, MD;  Location: [facility];  Service: Urology;  Laterality: N/A;   TRANSURETHRAL RESECTION OF BLADDER TUMOR WITH MITOMYCIN-C N/A 12/23/2019   Procedure: TRANSURETHRAL RESECTION OF BLADDER TUMOR WITH POST-OP  GEMCITABINE;  Surgeon: Franchot Gallo, MD;  Location: [facility];  Service: Urology;  Laterality: N/A;  45 MINS     Current Outpatient Medications  Medication Sig Dispense Refill   albuterol (VENTOLIN HFA) 108 (90 Base) MCG/ACT inhaler Inhale 2 puffs into the lungs every 4 (four) hours as needed for  wheezing or shortness of breath. 1 each 5   aspirin EC 81 MG tablet Take 81 mg by mouth daily. Swallow whole.     Budeson-Glycopyrrol-Formoterol (BREZTRI AEROSPHERE) 160-9-4.8 MCG/ACT AERO Inhale 2 puffs into the lungs in the morning and at bedtime. 10.7 g 3   esomeprazole (NEXIUM) 40 MG capsule Take 1 capsule by mouth once daily 90 capsule 2   ezetimibe (ZETIA) 10 MG tablet Take 10 mg by mouth at bedtime.     insulin aspart protamine- aspart (NOVOLOG MIX 70/30) (70-30) 100 UNIT/ML injection Inject 44 Units into the skin 2 (two) times daily.      lisinopril (PRINIVIL,ZESTRIL) 10 MG tablet Take 10 mg by mouth every morning.     metFORMIN (GLUCOPHAGE) 1000 MG tablet Take 1,000 mg by mouth 2 (two)  times daily.     Oxycodone HCl 10 MG TABS Take 10 mg by mouth every 6 (six) hours as needed.     pravastatin (PRAVACHOL) 40 MG tablet Take 40 mg by mouth daily.     pregabalin (LYRICA) 100 MG capsule Take 100 mg by mouth 2 (two) times daily.     tamsulosin (FLOMAX) 0.4 MG CAPS capsule Take 0.4 mg by mouth every morning.     traZODone (DESYREL) 50 MG tablet Take 50 mg by mouth at bedtime.     trospium (SANCTURA) 20 MG tablet Take 20 mg by mouth 2 (two) times daily.     No current facility-administered medications for this visit.    Allergies:   Aripiprazole, Contrast media [iodinated contrast media], Dulaglutide, and Rosuvastatin    Social History:  The patient  reports that he has been smoking cigarettes. He has a 90.00 pack-year smoking history. He has never used smokeless tobacco. He reports that he does not drink alcohol and does not use drugs.   Family History:  The patient's family history includes Heart attack in his mother; Lung cancer in his nephew.    ROS:  Please see the history of present illness.   Otherwise, review of systems are positive for right sided chest pain.   All other systems are reviewed and negative.    PHYSICAL EXAM: VS:  BP 132/70   Pulse 80   Ht '5\' 10"'$  (1.778 m)    Wt 265 lb (120.2 kg)   SpO2 96%   BMI 38.02 kg/m  , BMI Body mass index is 38.02 kg/m. GEN: Well nourished, well developed, in no acute distress HEENT: normal Neck: no JVD, carotid bruits, or masses Cardiac: RRR; no murmurs, rubs, or gallops,no edema  Respiratory:  clear to auscultation bilaterally, normal work of breathing GI: soft, nontender, nondistended, + BS MS: no deformity or atrophy Skin: warm and dry, no rash Neuro:  Strength and sensation are intact Psych: euthymic mood, full affect   EKG:   The ekg ordered today demonstrates normal ECG   Recent Labs: 12/27/2020: Hemoglobin 14.8; Platelets 201.0   Lipid Panel No results found for: "CHOL", "TRIG", "HDL", "CHOLHDL", "VLDL", "LDLCALC", "LDLDIRECT"   Other studies Reviewed: Additional studies/ records that were reviewed today with results demonstrating: LDL 64, triglycerides 154, HDL 38, total cholesterol 129 in January 2023.   ASSESSMENT AND PLAN:  Chest pain: Atypical for cardiac pain.  It is on the right side of his chest.  We discussed ischemic evaluation but given the time course of the symptoms, he is comfortable with waiting.  He will let us know if symptoms get worse, could consider coronary CTA if the character, frequency or severity of the pain changed.  He will let us know. Diabetes: A1c 6.8.  Well-controlled.  Whole food, plant-based diet.  High-fiber diet.  Avoid processed foods. Hyperlipidemia: Lipids controlled on Zetia and pravastatin. We spoke about the importance of increasing activity.  Otherwise, risk factors are well controlled.   Current medicines are reviewed at length with the patient today.  The patient concerns regarding his medicines were addressed.  The following changes have been made:  No change  Labs/ tests ordered today include:  No orders of the defined types were placed in this encounter.   Recommend 150 minutes/week of aerobic exercise Low fat, low carb, high fiber diet  recommended  Disposition:   FU as needed   Signed, Larae Grooms, MD  08/22/2021 11:09 AM    [facility] [place], [place], [place]  [address] Phone: [phone]; Fax: [phone]

## 2021-09-05 DIAGNOSIS — M25462 Effusion, left knee: Secondary | ICD-10-CM | POA: Diagnosis not present

## 2021-09-05 DIAGNOSIS — M79643 Pain in unspecified hand: Secondary | ICD-10-CM | POA: Diagnosis not present

## 2021-09-05 DIAGNOSIS — M1990 Unspecified osteoarthritis, unspecified site: Secondary | ICD-10-CM | POA: Diagnosis not present

## 2021-09-05 DIAGNOSIS — M25561 Pain in right knee: Secondary | ICD-10-CM | POA: Diagnosis not present

## 2021-09-05 DIAGNOSIS — R768 Other specified abnormal immunological findings in serum: Secondary | ICD-10-CM | POA: Diagnosis not present

## 2021-09-17 DIAGNOSIS — E669 Obesity, unspecified: Secondary | ICD-10-CM | POA: Diagnosis not present

## 2021-09-17 DIAGNOSIS — E1142 Type 2 diabetes mellitus with diabetic polyneuropathy: Secondary | ICD-10-CM | POA: Diagnosis not present

## 2021-09-17 DIAGNOSIS — Z720 Tobacco use: Secondary | ICD-10-CM | POA: Diagnosis not present

## 2021-09-17 DIAGNOSIS — Z794 Long term (current) use of insulin: Secondary | ICD-10-CM | POA: Diagnosis not present

## 2021-09-18 DIAGNOSIS — H53453 Other localized visual field defect, bilateral: Secondary | ICD-10-CM | POA: Diagnosis not present

## 2021-09-18 DIAGNOSIS — H02831 Dermatochalasis of right upper eyelid: Secondary | ICD-10-CM | POA: Diagnosis not present

## 2021-09-18 DIAGNOSIS — H02834 Dermatochalasis of left upper eyelid: Secondary | ICD-10-CM | POA: Diagnosis not present

## 2021-09-20 DIAGNOSIS — E782 Mixed hyperlipidemia: Secondary | ICD-10-CM | POA: Diagnosis not present

## 2021-09-20 DIAGNOSIS — Z23 Encounter for immunization: Secondary | ICD-10-CM | POA: Diagnosis not present

## 2021-09-20 DIAGNOSIS — I700 Atherosclerosis of aorta: Secondary | ICD-10-CM | POA: Diagnosis not present

## 2021-09-20 DIAGNOSIS — E1159 Type 2 diabetes mellitus with other circulatory complications: Secondary | ICD-10-CM | POA: Diagnosis not present

## 2021-09-20 DIAGNOSIS — F17200 Nicotine dependence, unspecified, uncomplicated: Secondary | ICD-10-CM | POA: Diagnosis not present

## 2021-09-20 DIAGNOSIS — I10 Essential (primary) hypertension: Secondary | ICD-10-CM | POA: Diagnosis not present

## 2021-09-20 DIAGNOSIS — J449 Chronic obstructive pulmonary disease, unspecified: Secondary | ICD-10-CM | POA: Diagnosis not present

## 2021-09-21 ENCOUNTER — Other Ambulatory Visit: Payer: Self-pay | Admitting: Family Medicine

## 2021-09-21 DIAGNOSIS — R748 Abnormal levels of other serum enzymes: Secondary | ICD-10-CM

## 2021-09-26 ENCOUNTER — Ambulatory Visit (INDEPENDENT_AMBULATORY_CARE_PROVIDER_SITE_OTHER): Payer: PPO

## 2021-09-26 ENCOUNTER — Ambulatory Visit: Payer: PPO | Admitting: Podiatry

## 2021-09-26 DIAGNOSIS — M722 Plantar fascial fibromatosis: Secondary | ICD-10-CM

## 2021-09-26 DIAGNOSIS — M79675 Pain in left toe(s): Secondary | ICD-10-CM | POA: Diagnosis not present

## 2021-09-26 DIAGNOSIS — E119 Type 2 diabetes mellitus without complications: Secondary | ICD-10-CM | POA: Diagnosis not present

## 2021-09-26 DIAGNOSIS — M79674 Pain in right toe(s): Secondary | ICD-10-CM | POA: Diagnosis not present

## 2021-09-26 DIAGNOSIS — B351 Tinea unguium: Secondary | ICD-10-CM

## 2021-09-26 MED ORDER — BETAMETHASONE SOD PHOS & ACET 6 (3-3) MG/ML IJ SUSP
3.0000 mg | Freq: Once | INTRAMUSCULAR | Status: AC
  Administered 2021-09-26: 3 mg via INTRA_ARTICULAR

## 2021-09-26 NOTE — Progress Notes (Signed)
Chief Complaint  Patient presents with   Foot Pain    Right foot pain     SUBJECTIVE Patient with a history of diabetes mellitus presents to office today complaining of elongated, thickened nails that cause pain while ambulating in shoes.  Patient is unable to trim their own nails.   Patient also states that he has been experiencing excruciating heel pain to the plantar aspect of the right heel for the last few weeks.  He does have a history of plantar fasciitis.  Currently he is not on anything for treatment.  He presents for further treatment and evaluation  Past Medical History:  Diagnosis Date   Arthritis    Arthritis -DDD."chronic pain med used"   Bladder tumor    states that it was cancer   Cancer ([place])    Bladder cancer"-Dr.Ottelin- checks every 3 months--no signs last checks.   Chronic knee pain RIGHT KNEE --  S/P KNEE REPLACEMENT JAN 2012--  PT STATES  NEEDS ANOTHER REPLACEMENT DUE TO JOINT RECALL   Complication of anesthesia EMERGENCE DELIRIUM   occurred x1- 2 yrs ago.few surgeries ago, none recently   COPD (chronic obstructive pulmonary disease) ([place])    Coronary artery disease    Depression    DM type 2 (diabetes mellitus, type 2) (HCC)    GERD (gastroesophageal reflux disease)    H/O hiatal hernia    Headache    History of bladder cancer TCC OF BLADDER  --  FOLLOWED BY DR Karsten Ro   History of peptic ulcer AGE 19   Hypertension    pt denies, takes lisimopril for kidneys   Mild obstructive sleep apnea    per study 09-26-2007--  no cpap rx   Nocturia    Peripheral neuropathy BOTTOM RIGHT FOOT   Seasonal allergies    Short of breath on exertion    Urgency of urination    Past Surgical History:  Procedure Laterality Date   BACK SURGERY  X2   lower back   BILATERAL KNEE ARTHROSCOPY     BILATERAL SHOULDER SURG.  2003   CARDIAC CATHETERIZATION  04-13-2007   ---- DR Irish Lack   NO SIGNIFICANT CAD/ NORMAL LVF   CARPAL TUNNEL RELEASE  04/11/2006   RIGHT    COLONOSCOPY WITH PROPOFOL N/A 05/01/2015   Procedure: COLONOSCOPY WITH PROPOFOL;  Surgeon: Garlan Fair, MD;  [facility];  Service: Endoscopy;  Laterality: N/A;   CYSTOSCOPY W/ RETROGRADES Bilateral 12/23/2019   Procedure: CYSTOSCOPY WITH RETROGRADE PYELOGRAM;  Surgeon: Franchot [place]o, MD;  Location: [facility];  Service: Urology;  Laterality: Bilateral;   HAND SURGERY Right    cyst removed   LEFT CARPAL. TUNNEL RELEASE     LEFT EYE SURG.   2008   REMOVAL OF BB   REPAIR RECURRENT LEFT ROTATOR CUFF TEAR  01/02/2011   REVERSE SHOULDER ARTHROPLASTY Left 04/08/2018   Procedure: REVERSE SHOULDER ARTHROPLASTY;  Surgeon: Nicholes Stairs, MD;  Location: [place];  Service: Orthopedics;  Laterality: Left;  2.5 hours   SIGMOID COLECTOMY  09/29/2001   DIVERTICULITIS   TOTAL KNEE ARTHROPLASTY  04/03/2010   RIGHT   TRANSURETHRAL RESECTION OF BLADDER TUMOR  07/06/2010   TRANSURETHRAL RESECTION OF BLADDER TUMOR  05/20/2011   Procedure: TRANSURETHRAL RESECTION OF BLADDER TUMOR (TURBT);  Surgeon: Claybon Jabs, MD;  Location[facility];  Service: Urology;  Laterality: N/A;  GYRUS INSTILL MYTOMYCIN C NO BED   TRANSURETHRAL RESECTION OF BLADDER TUMOR  01/31/2012  Procedure: TRANSURETHRAL RESECTION OF BLADDER TUMOR (TURBT);  Surgeon: Claybon Jabs, MD;  Location: [facility];  Service: Urology;  Laterality: N/A;   TRANSURETHRAL RESECTION OF BLADDER TUMOR WITH GYRUS (TURBT-GYRUS) N/A 11/22/2013   Procedure: Bladder biopsy with fulgeration;  Surgeon: Claybon Jabs, MD;  Location: [facility];  Service: Urology;  Laterality: N/A;   TRANSURETHRAL RESECTION OF BLADDER TUMOR WITH MITOMYCIN-C N/A 12/23/2019   Procedure: TRANSURETHRAL RESECTION OF BLADDER TUMOR WITH POST-OP  GEMCITABINE;  Surgeon: Franchot [place]o, MD;  Location: [facility];  Service: Urology;  Laterality: N/A;  45 MINS   Allergies  Allergen  Reactions   Aripiprazole     Other reaction(s): Unknown   Contrast Media [Iodinated Contrast Media] Rash   Dulaglutide     Other reaction(s): Unknown   Rosuvastatin     Other reaction(s): Unknown    OBJECTIVE General Patient is awake, alert, and oriented x 3 and in no acute distress. Derm Skin is dry and supple bilateral. Negative open lesions or macerations. Remaining integument unremarkable. Nails are tender, long, thickened and dystrophic with subungual debris, consistent with onychomycosis, 1-5 bilateral. No signs of infection noted. Vasc  DP and PT pedal pulses palpable bilaterally. Temperature gradient within normal limits.  Neuro Epicritic and protective threshold sensation diminished bilaterally.  Musculoskeletal Exam pain on palpation noted to the plantar medial aspect of the right heel  ASSESSMENT 1. Diabetes Mellitus w/ peripheral neuropathy 2.  Pain due to onychomycosis of toenails bilateral 3.  Plantar fasciitis right  PLAN OF CARE 1. Patient evaluated today.  Comprehensive diabetic foot exam performed today 2. Instructed to maintain good pedal hygiene and foot care. Stressed importance of controlling blood sugar.  3. Mechanical debridement of nails 1-5 bilaterally performed using a nail nipper. Filed with dremel without incident.  4.  Injection of 0.5 cc Celestone Soluspan injected into the plantar fascia right  5.  Recommend good supportive shoes and sneakers  6.  Return to clinic in 3 mos.     Edrick Kins, DPM [facility]  Dr. Edrick Kins, DPM    [address], [place] [address]                Office [phone]  Fax [phone]

## 2021-09-27 ENCOUNTER — Ambulatory Visit
Admission: RE | Admit: 2021-09-27 | Discharge: 2021-09-27 | Disposition: A | Discharge status: Home / Self Care | Payer: PPO | Source: Ambulatory Visit | Attending: Family Medicine | Admitting: Family Medicine

## 2021-09-27 DIAGNOSIS — G894 Chronic pain syndrome: Secondary | ICD-10-CM | POA: Diagnosis not present

## 2021-09-27 DIAGNOSIS — R748 Abnormal levels of other serum enzymes: Secondary | ICD-10-CM

## 2021-09-27 DIAGNOSIS — M25561 Pain in right knee: Secondary | ICD-10-CM | POA: Diagnosis not present

## 2021-09-27 DIAGNOSIS — M47817 Spondylosis without myelopathy or radiculopathy, lumbosacral region: Secondary | ICD-10-CM | POA: Diagnosis not present

## 2021-09-27 DIAGNOSIS — R945 Abnormal results of liver function studies: Secondary | ICD-10-CM | POA: Diagnosis not present

## 2021-09-27 DIAGNOSIS — M62830 Muscle spasm of back: Secondary | ICD-10-CM | POA: Diagnosis not present

## 2021-10-02 ENCOUNTER — Other Ambulatory Visit: Payer: Self-pay | Admitting: Podiatry

## 2021-10-02 ENCOUNTER — Telehealth: Payer: Self-pay

## 2021-10-02 ENCOUNTER — Ambulatory Visit: Payer: Medicare Other | Admitting: Podiatrist

## 2021-10-02 MED ORDER — METHYLPREDNISOLONE 4 MG PO TBPK: ORAL_TABLET | ORAL | 0 refills | Status: DC

## 2021-10-02 MED ORDER — MELOXICAM 15 MG PO TABS: 15.0000 mg | ORAL_TABLET | Freq: Every day | ORAL | 1 refills | Status: DC

## 2021-10-02 NOTE — Telephone Encounter (Signed)
Prescription for Medrol Dosepak and meloxicam 15 mg daily sent to the pharmacy.  This should help with the inflammation and reduce the pain.  Notify the patient via telephone.  Thanks, Dr. Amalia Hailey

## 2021-10-02 NOTE — Progress Notes (Signed)
PRN plantar fasciitis

## 2021-10-03 NOTE — Telephone Encounter (Signed)
Spoke with the patient and let him know that the rx has been sent to the pharmacy for pick up and he verbalized understanding.

## 2021-10-09 ENCOUNTER — Telehealth: Payer: Self-pay | Admitting: Pulmonary Disease

## 2021-10-09 NOTE — Telephone Encounter (Signed)
Called and spoke to [place] at [facility] and she states that they do have CPAP order. She states someone will call the patient and reach out to him regarding the cpap. Nothing further needed

## 2021-11-21 DIAGNOSIS — J449 Chronic obstructive pulmonary disease, unspecified: Secondary | ICD-10-CM | POA: Diagnosis not present

## 2021-11-21 DIAGNOSIS — H02839 Dermatochalasis of unspecified eye, unspecified eyelid: Secondary | ICD-10-CM | POA: Diagnosis not present

## 2021-11-21 DIAGNOSIS — H04123 Dry eye syndrome of bilateral lacrimal glands: Secondary | ICD-10-CM | POA: Diagnosis not present

## 2021-11-21 DIAGNOSIS — H52223 Regular astigmatism, bilateral: Secondary | ICD-10-CM | POA: Diagnosis not present

## 2021-11-21 DIAGNOSIS — H5203 Hypermetropia, bilateral: Secondary | ICD-10-CM | POA: Diagnosis not present

## 2021-11-21 DIAGNOSIS — H31092 Other chorioretinal scars, left eye: Secondary | ICD-10-CM | POA: Diagnosis not present

## 2021-11-21 DIAGNOSIS — H2513 Age-related nuclear cataract, bilateral: Secondary | ICD-10-CM | POA: Diagnosis not present

## 2021-11-21 DIAGNOSIS — E119 Type 2 diabetes mellitus without complications: Secondary | ICD-10-CM | POA: Diagnosis not present

## 2021-11-22 DIAGNOSIS — G894 Chronic pain syndrome: Secondary | ICD-10-CM | POA: Diagnosis not present

## 2021-11-22 DIAGNOSIS — M25561 Pain in right knee: Secondary | ICD-10-CM | POA: Diagnosis not present

## 2021-11-22 DIAGNOSIS — M47817 Spondylosis without myelopathy or radiculopathy, lumbosacral region: Secondary | ICD-10-CM | POA: Diagnosis not present

## 2021-11-22 DIAGNOSIS — M62830 Muscle spasm of back: Secondary | ICD-10-CM | POA: Diagnosis not present

## 2021-11-27 DIAGNOSIS — M1711 Unilateral primary osteoarthritis, right knee: Secondary | ICD-10-CM | POA: Diagnosis not present

## 2021-11-27 DIAGNOSIS — M1712 Unilateral primary osteoarthritis, left knee: Secondary | ICD-10-CM | POA: Diagnosis not present

## 2021-11-29 ENCOUNTER — Encounter: Payer: Self-pay | Admitting: Gastroenterology

## 2021-12-05 DIAGNOSIS — L720 Epidermal cyst: Secondary | ICD-10-CM | POA: Diagnosis not present

## 2021-12-20 ENCOUNTER — Ambulatory Visit: Payer: PPO | Admitting: Internal Medicine

## 2021-12-20 ENCOUNTER — Encounter: Payer: Self-pay | Admitting: Internal Medicine

## 2021-12-20 VITALS — BP 108/60 | HR 88 | Temp 98.2°F | Ht 70.0 in | Wt 270.2 lb

## 2021-12-20 DIAGNOSIS — J439 Emphysema, unspecified: Secondary | ICD-10-CM

## 2021-12-20 DIAGNOSIS — J4489 Other specified chronic obstructive pulmonary disease: Secondary | ICD-10-CM | POA: Diagnosis not present

## 2021-12-20 NOTE — Patient Instructions (Addendum)
Please schedule follow up scheduled with APP in 3 months.   Before your next visit I would like you to have: Resume Breztri 2 puffs twice a day. Gargle after use to prevent thrush or irritation in your throat.  I will check with our pharmacy team to see if there is a cheaper alternative for you.  Resume albuterol inhaler as needed for shortness of breath in between using breztri.  Continue wearing your CPAP machine.  I am referring you for our lung cancer screening program. They will call you to schedule an appointment to discuss further how to protect you from lung cancer related to your smoking.    What are the benefits of quitting smoking? Quitting smoking can lower your chances of getting or dying from heart disease, lung disease, kidney failure, infection, or cancer. It can also lower your chances of getting osteoporosis, a condition that makes your bones weak. Plus, quitting smoking can help your skin look younger and reduce the chances that you will have problems with sex.  Quitting smoking will improve your health no matter how old you are, and no matter how long or how much you have smoked.  What should I do if I want to quit smoking? The letters in the word "START" can help you remember the steps to take: S = Set a quit date. T = Tell family, friends, and the people around you that you plan to quit. A = Anticipate or plan ahead for the tough times you'll face while quitting. R = Remove cigarettes and other tobacco products from your home, car, and work. T = Talk to your doctor about getting help to quit.  How can my doctor or nurse help? Your doctor or nurse can give you advice on the best way to quit. He or she can also put you in touch with counselors or other people you can call for support. Plus, your doctor or nurse can give you medicines to: ?Reduce your craving for cigarettes ?Reduce the unpleasant symptoms that happen when you stop smoking (called "withdrawal  symptoms"). You can also get help from a free phone line (1-800-QUIT-NOW) or go online to [place].fr.  What are the symptoms of withdrawal? The symptoms include: ?Trouble sleeping ?Being irritable, anxious or restless ?Getting frustrated or angry ?Having trouble thinking clearly  Some people who stop smoking become temporarily depressed. Some people need treatment for depression, such as counseling or antidepressant medicines. Depressed people might: ?No longer enjoy or care about doing the things they used to like to do ?Feel sad, down, hopeless, nervous, or cranky most of the day, almost every day ?Lose or gain weight ?Sleep too much or too little ?Feel tired or like they have no energy ?Feel guilty or like they are worth nothing ?Forget things or feel confused ?Move and speak more slowly than usual ?Act restless or have trouble staying still ?Think about death or suicide  If you think you might be depressed, see your doctor or nurse. Only someone trained in mental health can tell for sure if you are depressed. If you ever feel like you might hurt yourself, go straight to the nearest emergency department. Or you can call for an ambulance (in the [place] and [place], [place] 9-1-1) or call your doctor or nurse right away and tell them it is an emergency. You can also reach the [place] National Suicide Prevention Lifeline at 5638789086 or http://walker-sanchez.info/.  How do medicines help you stop smoking? Different medicines work in different ways: ?  Nicotine replacement therapy eases withdrawal and reduces your body's craving for nicotine, the main drug found in cigarettes. There are different forms of nicotine replacement, including skin patches, lozenges, gum, nasal sprays, and "puffers" or inhalers. Many can be bought without a prescription, while others might require one. ?Bupropion is a prescription medicine that reduces your desire to smoke. This medicine is sold under the  brand names Zyban and Wellbutrin. It is also available in a generic version, which is cheaper than brand name medicines. ?Varenicline (brand names: Chantix, Champix) is a prescription medicine that reduces withdrawal symptoms and cigarette cravings. If you think you'd like to take varenicline and you have a history of depression, anxiety, or heart disease, discuss this with your doctor or nurse before taking the medicine. Varenicline can also increase the effects of alcohol in some people. It's a good idea to limit drinking while you're taking it, at least until you know how it affects you.  How does counseling work? Counseling can happen during formal office visits or just over the phone. A counselor can help you: ?Figure out what triggers your smoking and what to do instead ?Overcome cravings ?Figure out what went wrong when you tried to quit before  What works best? Studies show that people have the best luck at quitting if they take medicines to help them quit and work with a Social worker. It might also be helpful to combine nicotine replacement with one of the prescription medicines that help people quit. In some cases, it might even make sense to take bupropion and varenicline together.  What about e-cigarettes? Sometimes people wonder if using electronic cigarettes, or "e-cigarettes," might help them quit smoking. Using e-cigarettes is also called "vaping." Doctors do not recommend e-cigarettes in place of medicines and counseling. That's because e-cigarettes still contain nicotine as well as other substances that might be harmful. It's not clear how they can affect a person's health in the long term.  Will I gain weight if I quit? Yes, you might gain a few pounds. But quitting smoking will have a much more positive effect on your health than weighing a few pounds more. Plus, you can help prevent some weight gain by being more active and eating less. Taking the medicine bupropion might help  control weight gain.   What else can I do to improve my chances of quitting? You can: ?Start exercising. ?Stay away from smokers and places that you associate with smoking. If people close to you smoke, ask them to quit with you. ?Keep gum, hard candy, or something to put in your mouth handy. If you get a craving for a cigarette, try one of these instead. ?Don't give up, even if you start smoking again. It takes most people a few tries before they succeed.  What if I am pregnant and I smoke? If you are pregnant, it's really important for the health of your baby that you quit. Ask your doctor what options you have, and what is safest for your baby

## 2021-12-20 NOTE — Progress Notes (Addendum)
Michael Horn    [id number]    1950/07/19  Primary Care Physician:Shaw, Nathen May, MD Date of Appointment: 12/20/2021 Established Patient Visit  Chief complaint:   Chief Complaint  Patient presents with   Acute Visit    Sleeps well with bipap at night, but is sob with activity for the past couple of weeks since he received his CPAP machine.     HPI: Michael Horn is a 71 y.o. man with tobacco use disorder and COPD. Also has OSA on CPAP.  Interval Updates: Here for follow up. Last seen by me in Dec 2022. Has seen Dr. Elsworth Soho for OSA in the interim. Started on CPAP therapy. Feels sleep quality has improved a lot.   Not taking albuterol consistently with dyspnea.  Here for worsening shortness of breath. Not taking breztri consistently. Not taking albuterol inhaler. Still smoking 1.5 ppd.   Having wheezing, chest tightness.  Coughing up clear phlegm.  No hemoptysis, fevers, chills, night sweats or unintentional weight loss.   Asking if he needs oxygen.    I have reviewed the patient's family social and past medical history and updated as appropriate.   Past Medical History:  Diagnosis Date   Arthritis    Arthritis -DDD."chronic pain med used"   Bladder tumor    states that it was cancer   Cancer ([place])    Bladder cancer"-Dr.Ottelin- checks every 3 months--no signs last checks.   Chronic knee pain RIGHT KNEE --  S/P KNEE REPLACEMENT JAN 2012--  PT STATES  NEEDS ANOTHER REPLACEMENT DUE TO JOINT RECALL   Complication of anesthesia EMERGENCE DELIRIUM   occurred x1- 2 yrs ago.few surgeries ago, none recently   COPD (chronic obstructive pulmonary disease) ([place])    Coronary artery disease    Depression    DM type 2 (diabetes mellitus, type 2) (HCC)    GERD (gastroesophageal reflux disease)    H/O hiatal hernia    Headache    History of bladder cancer TCC OF BLADDER  --  FOLLOWED BY DR Karsten Ro   History of peptic ulcer AGE 83   Hypertension    pt denies,  takes lisimopril for kidneys   Mild obstructive sleep apnea    per study 09-26-2007--  no cpap rx   Nocturia    Peripheral neuropathy BOTTOM RIGHT FOOT   Seasonal allergies    Short of breath on exertion    Urgency of urination     Past Surgical History:  Procedure Laterality Date   BACK SURGERY  X2   lower back   BILATERAL KNEE ARTHROSCOPY     BILATERAL SHOULDER SURG.  2003   CARDIAC CATHETERIZATION  04-13-2007   ---- DR Irish Lack   NO SIGNIFICANT CAD/ NORMAL LVF   CARPAL TUNNEL RELEASE  04/11/2006   RIGHT   COLONOSCOPY WITH PROPOFOL N/A 05/01/2015   Procedure: COLONOSCOPY WITH PROPOFOL;  Surgeon: Garlan Fair, MD;  Location: [facility];  Service: Endoscopy;  Laterality: N/A;   CYSTOSCOPY W/ RETROGRADES Bilateral 12/23/2019   Procedure: CYSTOSCOPY WITH RETROGRADE PYELOGRAM;  Surgeon: Franchot Gallo, MD;  Location: [facility];  Service: Urology;  Laterality: Bilateral;   HAND SURGERY Right    cyst removed   LEFT CARPAL. TUNNEL RELEASE     LEFT EYE SURG.   2008   REMOVAL OF BB   REPAIR RECURRENT LEFT ROTATOR CUFF TEAR  01/02/2011   REVERSE SHOULDER ARTHROPLASTY Left 04/08/2018   Procedure: REVERSE SHOULDER ARTHROPLASTY;  Surgeon: Nicholes Stairs, MD;  Location: [facility];  Service: Orthopedics;  Laterality: Left;  2.5 hours   SIGMOID COLECTOMY  09/29/2001   DIVERTICULITIS   TOTAL KNEE ARTHROPLASTY  04/03/2010   RIGHT   TRANSURETHRAL RESECTION OF BLADDER TUMOR  07/06/2010   TRANSURETHRAL RESECTION OF BLADDER TUMOR  05/20/2011   Procedure: TRANSURETHRAL RESECTION OF BLADDER TUMOR (TURBT);  Surgeon: Claybon Jabs, MD;  Location: [facility];  Service: Urology;  Laterality: N/A;  GYRUS INSTILL MYTOMYCIN C NO BED   TRANSURETHRAL RESECTION OF BLADDER TUMOR  01/31/2012   Procedure: TRANSURETHRAL RESECTION OF BLADDER TUMOR (TURBT);  Surgeon: Claybon Jabs, MD;  Location: [facility];  Service: Urology;  Laterality: N/A;    TRANSURETHRAL RESECTION OF BLADDER TUMOR WITH GYRUS (TURBT-GYRUS) N/A 11/22/2013   Procedure: Bladder biopsy with fulgeration;  Surgeon: Claybon Jabs, MD;  Location: [facility];  Service: Urology;  Laterality: N/A;   TRANSURETHRAL RESECTION OF BLADDER TUMOR WITH MITOMYCIN-C N/A 12/23/2019   Procedure: TRANSURETHRAL RESECTION OF BLADDER TUMOR WITH POST-OP  GEMCITABINE;  Surgeon: Franchot Gallo, MD;  Location: [facility];  Service: Urology;  Laterality: N/A;  75 MINS    Family History  Problem Relation Age of Onset   Heart attack Mother    Lung cancer Nephew    Colon cancer Neg Hx    Rectal cancer Neg Hx    Stomach cancer Neg Hx     Social History   Occupational History   Occupation: retired    Comment: [employer]  Tobacco Use   Smoking status: Every Day    Packs/day: 2.00    Years: 60.00    Total pack years: 120.00    Types: Cigarettes   Smokeless tobacco: Never   Tobacco comments:    Smoking a little over a pack per day.  Trying to quit.  12/20/21 hfb  Vaping Use   Vaping Use: Never used  Substance and Sexual Activity   Alcohol use: No    Alcohol/week: 0.0 standard drinks of alcohol   Drug use: No   Sexual activity: Not on file     Physical Exam: Blood pressure 108/60, pulse 88, temperature 98.2 F (36.8 C), temperature source Oral, height '5\' 10"'$  (1.778 m), weight 270 lb 3.2 oz (122.6 kg), SpO2 94 %.  Gen:      No acute distress, obese Lungs:   bilateral end expiratory wheezes, no crackles, no increased wob.  CV:         Regular rate and rhythm; no murmurs, rubs, or gallops.  No pedal edema   Data Reviewed: Imaging: I have personally reviewed the chest xray Nov 2021 - no acute process  PFTs:     Latest Ref Rng & Units 02/02/2021   10:51 AM  PFT Results  FVC-Pre L 3.54   FVC-Predicted Pre % 82   FVC-Post L 3.68   FVC-Predicted Post % 85   Pre FEV1/FVC % % 69   Post FEV1/FCV % % 71   FEV1-Pre L 2.43    FEV1-Predicted Pre % 77   FEV1-Post L 2.60   DLCO uncorrected ml/min/mmHg 22.86   DLCO UNC% % 90   DLCO corrected ml/min/mmHg 22.73   DLCO COR %Predicted % 89   DLVA Predicted % 90   TLC L 7.28   TLC % Predicted % 105   RV % Predicted % 156    I have personally reviewed the patient's PFTs and mild airflow limitation  without BD response.+ air trapping. Normal dlco.   Labs: Lab Results  Component Value Date   WBC 6.8 12/27/2020   HGB 14.8 12/27/2020   HCT 44.3 12/27/2020   MCV 85.5 12/27/2020   PLT 201.0 12/27/2020    Immunization status: Immunization History  Administered Date(s) Administered   Fluad Quad(high Dose 65+) 01/18/2021   Influenza Split 01/14/2008, 01/26/2009, 01/21/2012, 12/30/2012, 12/24/2016, 01/12/2018, 01/16/2021   Influenza, High Dose Seasonal PF 01/12/2018   Influenza,inj,Quad PF,6+ Mos 01/15/2011   Influenza,inj,quad, With Preservative 12/27/2013, 01/12/2018   Moderna Sars-Covid-2 Vaccination 04/02/2019, 05/03/2019, 02/11/2020   Pneumococcal Conjugate-13 08/04/2014   Pneumococcal Polysaccharide-23 01/12/2007, 01/12/2016   Td 02/11/2004   Tdap 10/23/2009   Zoster, Live 06/04/2012, 03/23/2021     Assessment:  COPD with mild airflow limitation FEV1 77% of predicted, not well controlled OSA on CPAP  download reviewed today shows 100% adherence, AHI <1, no leak Everyday tobacco use disorder 1.5 ppd.   Plan/Recommendations: Resume Breztri 2 puffs twice a day. Gargle after use to prevent thrush or irritation in your throat. I will check with our pharmacy team to see if there is a cheaper alternative for you.  Resume albuterol inhaler as needed for shortness of breath in between using breztri.  Continue wearing your CPAP machine. Well controlled Need for lung cancer screening. Will refer.  Ambulatory desaturation study today.  Flu shot today.   Return to Care: Return in about 3 months (around 03/22/2022).   Lenice Llamas, MD [facility] [place]

## 2021-12-21 ENCOUNTER — Other Ambulatory Visit (HOSPITAL_COMMUNITY): Payer: Self-pay

## 2021-12-21 ENCOUNTER — Telehealth: Payer: Self-pay

## 2021-12-21 DIAGNOSIS — J449 Chronic obstructive pulmonary disease, unspecified: Secondary | ICD-10-CM | POA: Diagnosis not present

## 2021-12-22 NOTE — Telephone Encounter (Signed)
Michael Horn can you see if patient qualifies for patient assistance for breztri and enroll him? See cost for breztri and trelegy below from pharmacy:   "Received request for a BIV on ICS+LABA+LAMA for patient. Here are my findings:   Both ICS+LABA+LAMA Breztri and Trelegy are covered by insurance. [name] is a $143.28 co-pay and Trelegy is a $146.06 co-pay. There was no mention of being in a coverage gap or showing a deductible. Both medications have patient assistance programs for Medicare patients as long as they meet requirements."

## 2021-12-24 ENCOUNTER — Other Ambulatory Visit: Payer: Self-pay | Admitting: Physician Assistant

## 2021-12-24 NOTE — Telephone Encounter (Signed)
Spoke with pt and confirmed home address to mail pt assistance paperwork out to. Paperwork placed in outgoing mail today. Nothing further needed at this time.

## 2021-12-26 DIAGNOSIS — M25511 Pain in right shoulder: Secondary | ICD-10-CM | POA: Diagnosis not present

## 2021-12-31 ENCOUNTER — Ambulatory Visit: Payer: PPO | Admitting: Podiatry

## 2021-12-31 DIAGNOSIS — B351 Tinea unguium: Secondary | ICD-10-CM

## 2021-12-31 DIAGNOSIS — M79675 Pain in left toe(s): Secondary | ICD-10-CM | POA: Diagnosis not present

## 2021-12-31 DIAGNOSIS — E0843 Diabetes mellitus due to underlying condition with diabetic autonomic (poly)neuropathy: Secondary | ICD-10-CM

## 2021-12-31 DIAGNOSIS — M79674 Pain in right toe(s): Secondary | ICD-10-CM | POA: Diagnosis not present

## 2021-12-31 NOTE — Progress Notes (Signed)
   Chief Complaint  Patient presents with   foot care    Patient is here for diabetic foot care.    SUBJECTIVE Patient with a history of diabetes mellitus presents to office today complaining of elongated, thickened nails that cause pain while ambulating in shoes.  Patient is unable to trim their own nails. Patient is here for further evaluation and treatment.  Past Medical History:  Diagnosis Date   Arthritis    Arthritis -DDD."chronic pain med used"   Bladder tumor    states that it was cancer   Cancer ([place])    Bladder cancer"-Dr.Ottelin- checks every 3 months--no signs last checks.   Chronic knee pain RIGHT KNEE --  S/P KNEE REPLACEMENT JAN 2012--  PT STATES  NEEDS ANOTHER REPLACEMENT DUE TO JOINT RECALL   Complication of anesthesia EMERGENCE DELIRIUM   occurred x1- 2 yrs ago.few surgeries ago, none recently   COPD (chronic obstructive pulmonary disease) ([place])    Coronary artery disease    Depression    DM type 2 (diabetes mellitus, type 2) (HCC)    GERD (gastroesophageal reflux disease)    H/O hiatal hernia    Headache    History of bladder cancer TCC OF BLADDER  --  FOLLOWED BY DR Karsten Ro   History of peptic ulcer AGE 2   Hypertension    pt denies, takes lisimopril for kidneys   Mild obstructive sleep apnea    per study 09-26-2007--  no cpap rx   Nocturia    Peripheral neuropathy BOTTOM RIGHT FOOT   Seasonal allergies    Short of breath on exertion    Urgency of urination     Allergies  Allergen Reactions   Aripiprazole     Other reaction(s): Unknown   Contrast Media [Iodinated Contrast Media] Rash   Dulaglutide     Other reaction(s): Unknown   Rosuvastatin     Other reaction(s): Unknown     OBJECTIVE General Patient is awake, alert, and oriented x 3 and in no acute distress. Derm Skin is dry and supple bilateral. Negative open lesions or macerations. Remaining integument unremarkable. Nails are tender, long, thickened and dystrophic with subungual debris,  consistent with onychomycosis, 1-5 bilateral. No signs of infection noted. Vasc  DP and PT pedal pulses palpable bilaterally. Temperature gradient within normal limits.  Neuro Epicritic and protective threshold sensation diminished bilaterally.  Musculoskeletal Exam No symptomatic pedal deformities noted bilateral. Muscular strength within normal limits.  ASSESSMENT 1. Diabetes Mellitus w/ peripheral neuropathy 2.  Pain due to onychomycosis of toenails bilateral  PLAN OF CARE 1. Patient evaluated today. 2. Instructed to maintain good pedal hygiene and foot care. Stressed importance of controlling blood sugar.  3. Mechanical debridement of nails 1-5 bilaterally performed using a nail nipper. Filed with dremel without incident.  4.  Appointment for diabetic shoes with insoles  5.  Return to clinic in 3 mos.     Edrick Kins, DPM [facility]  Dr. Edrick Kins, DPM    [address], [place] [address]                Office [phone]  Fax [phone]

## 2022-01-01 DIAGNOSIS — J449 Chronic obstructive pulmonary disease, unspecified: Secondary | ICD-10-CM | POA: Diagnosis not present

## 2022-01-02 ENCOUNTER — Ambulatory Visit: Payer: PPO | Admitting: Pulmonary Disease

## 2022-01-03 ENCOUNTER — Telehealth: Payer: Self-pay | Admitting: *Deleted

## 2022-01-03 NOTE — Telephone Encounter (Signed)
Paperwork received from patient for patient assistance for [employer] inhaler.  Form completed and placed in Dr. Mauricio Po box for her signature.

## 2022-01-09 DIAGNOSIS — J449 Chronic obstructive pulmonary disease, unspecified: Secondary | ICD-10-CM | POA: Diagnosis not present

## 2022-01-11 ENCOUNTER — Encounter: Payer: Self-pay | Admitting: Physician Assistant

## 2022-01-11 DIAGNOSIS — E1159 Type 2 diabetes mellitus with other circulatory complications: Secondary | ICD-10-CM | POA: Diagnosis not present

## 2022-01-11 DIAGNOSIS — J449 Chronic obstructive pulmonary disease, unspecified: Secondary | ICD-10-CM | POA: Diagnosis not present

## 2022-01-11 DIAGNOSIS — G8929 Other chronic pain: Secondary | ICD-10-CM

## 2022-01-11 DIAGNOSIS — F1190 Opioid use, unspecified, uncomplicated: Secondary | ICD-10-CM | POA: Diagnosis not present

## 2022-01-11 DIAGNOSIS — I10 Essential (primary) hypertension: Secondary | ICD-10-CM | POA: Diagnosis not present

## 2022-01-11 DIAGNOSIS — Z8551 Personal history of malignant neoplasm of bladder: Secondary | ICD-10-CM | POA: Diagnosis not present

## 2022-01-11 DIAGNOSIS — R1031 Right lower quadrant pain: Secondary | ICD-10-CM | POA: Diagnosis not present

## 2022-01-14 ENCOUNTER — Ambulatory Visit
Admission: RE | Admit: 2022-01-14 | Discharge: 2022-01-14 | Disposition: A | Discharge status: Home / Self Care | Payer: PPO | Source: Ambulatory Visit | Attending: Physician Assistant | Admitting: Physician Assistant

## 2022-01-14 ENCOUNTER — Other Ambulatory Visit: Payer: Self-pay | Admitting: Physician Assistant

## 2022-01-14 DIAGNOSIS — N200 Calculus of kidney: Secondary | ICD-10-CM | POA: Diagnosis not present

## 2022-01-14 DIAGNOSIS — R1031 Right lower quadrant pain: Secondary | ICD-10-CM

## 2022-01-14 DIAGNOSIS — R945 Abnormal results of liver function studies: Secondary | ICD-10-CM | POA: Diagnosis not present

## 2022-01-14 DIAGNOSIS — R109 Unspecified abdominal pain: Secondary | ICD-10-CM | POA: Diagnosis not present

## 2022-01-17 DIAGNOSIS — M25561 Pain in right knee: Secondary | ICD-10-CM | POA: Diagnosis not present

## 2022-01-17 DIAGNOSIS — M62830 Muscle spasm of back: Secondary | ICD-10-CM | POA: Diagnosis not present

## 2022-01-17 DIAGNOSIS — G894 Chronic pain syndrome: Secondary | ICD-10-CM | POA: Diagnosis not present

## 2022-01-17 DIAGNOSIS — M47817 Spondylosis without myelopathy or radiculopathy, lumbosacral region: Secondary | ICD-10-CM | POA: Diagnosis not present

## 2022-01-21 ENCOUNTER — Other Ambulatory Visit: Payer: Self-pay | Admitting: Physician Assistant

## 2022-01-21 DIAGNOSIS — J449 Chronic obstructive pulmonary disease, unspecified: Secondary | ICD-10-CM | POA: Diagnosis not present

## 2022-01-21 NOTE — Telephone Encounter (Signed)
Please advise 

## 2022-01-26 ENCOUNTER — Other Ambulatory Visit: Payer: Self-pay | Admitting: Physician Assistant

## 2022-01-29 ENCOUNTER — Telehealth: Payer: Self-pay | Admitting: Internal Medicine

## 2022-01-29 MED ORDER — BREZTRI AEROSPHERE 160-9-4.8 MCG/ACT IN AERO: 2.0000 | INHALATION_SPRAY | Freq: Two times a day (BID) | RESPIRATORY_TRACT | 5 refills | Status: DC

## 2022-01-29 NOTE — Telephone Encounter (Signed)
Called and spoke to wife and verified what medication she was needing refilled. Verified pharmacy as well with wife. Refill sent in. Nothing further needed

## 2022-01-30 ENCOUNTER — Telehealth: Payer: Self-pay | Admitting: Internal Medicine

## 2022-01-30 NOTE — Telephone Encounter (Signed)
I spoke to pt about his [facility] patient assistant form and informed the pt that I have faxed it to [employer] again. Also will leave Breztri up front for him to pick at his convenience. Pt verbalized understanding and nothing further needed at this time.

## 2022-02-06 DIAGNOSIS — M25511 Pain in right shoulder: Secondary | ICD-10-CM | POA: Diagnosis not present

## 2022-02-06 DIAGNOSIS — C678 Malignant neoplasm of overlapping sites of bladder: Secondary | ICD-10-CM | POA: Diagnosis not present

## 2022-02-12 ENCOUNTER — Other Ambulatory Visit: Payer: Self-pay | Admitting: Urology

## 2022-02-20 DIAGNOSIS — J449 Chronic obstructive pulmonary disease, unspecified: Secondary | ICD-10-CM | POA: Diagnosis not present

## 2022-02-21 DIAGNOSIS — R051 Acute cough: Secondary | ICD-10-CM | POA: Diagnosis not present

## 2022-02-21 DIAGNOSIS — J189 Pneumonia, unspecified organism: Secondary | ICD-10-CM | POA: Diagnosis not present

## 2022-02-22 ENCOUNTER — Ambulatory Visit: Payer: PPO | Admitting: Physician Assistant

## 2022-02-28 ENCOUNTER — Observation Stay (HOSPITAL_COMMUNITY)
Admission: EM | Admit: 2022-02-28 | Discharge: 2022-03-01 | Disposition: A | Discharge status: Home / Self Care | Payer: PPO | Attending: Student | Admitting: Student

## 2022-02-28 ENCOUNTER — Emergency Department (HOSPITAL_COMMUNITY): Payer: PPO

## 2022-02-28 ENCOUNTER — Observation Stay (HOSPITAL_COMMUNITY): Payer: PPO

## 2022-02-28 ENCOUNTER — Other Ambulatory Visit: Payer: Self-pay

## 2022-02-28 DIAGNOSIS — I4891 Unspecified atrial fibrillation: Secondary | ICD-10-CM

## 2022-02-28 DIAGNOSIS — Z794 Long term (current) use of insulin: Secondary | ICD-10-CM | POA: Insufficient documentation

## 2022-02-28 DIAGNOSIS — Z96651 Presence of right artificial knee joint: Secondary | ICD-10-CM | POA: Diagnosis not present

## 2022-02-28 DIAGNOSIS — Z7982 Long term (current) use of aspirin: Secondary | ICD-10-CM | POA: Insufficient documentation

## 2022-02-28 DIAGNOSIS — Z8551 Personal history of malignant neoplasm of bladder: Secondary | ICD-10-CM | POA: Diagnosis not present

## 2022-02-28 DIAGNOSIS — E6609 Other obesity due to excess calories: Secondary | ICD-10-CM | POA: Diagnosis not present

## 2022-02-28 DIAGNOSIS — Z7984 Long term (current) use of oral hypoglycemic drugs: Secondary | ICD-10-CM | POA: Diagnosis not present

## 2022-02-28 DIAGNOSIS — R0609 Other forms of dyspnea: Secondary | ICD-10-CM | POA: Diagnosis not present

## 2022-02-28 DIAGNOSIS — G4733 Obstructive sleep apnea (adult) (pediatric): Secondary | ICD-10-CM | POA: Diagnosis present

## 2022-02-28 DIAGNOSIS — J449 Chronic obstructive pulmonary disease, unspecified: Secondary | ICD-10-CM | POA: Diagnosis not present

## 2022-02-28 DIAGNOSIS — Z79899 Other long term (current) drug therapy: Secondary | ICD-10-CM | POA: Diagnosis not present

## 2022-02-28 DIAGNOSIS — E669 Obesity, unspecified: Secondary | ICD-10-CM | POA: Diagnosis present

## 2022-02-28 DIAGNOSIS — R55 Syncope and collapse: Secondary | ICD-10-CM | POA: Diagnosis not present

## 2022-02-28 DIAGNOSIS — J439 Emphysema, unspecified: Secondary | ICD-10-CM | POA: Diagnosis not present

## 2022-02-28 DIAGNOSIS — R4182 Altered mental status, unspecified: Secondary | ICD-10-CM | POA: Diagnosis not present

## 2022-02-28 DIAGNOSIS — R6510 Systemic inflammatory response syndrome (SIRS) of non-infectious origin without acute organ dysfunction: Secondary | ICD-10-CM | POA: Diagnosis not present

## 2022-02-28 DIAGNOSIS — J189 Pneumonia, unspecified organism: Secondary | ICD-10-CM | POA: Diagnosis not present

## 2022-02-28 DIAGNOSIS — Z720 Tobacco use: Secondary | ICD-10-CM | POA: Diagnosis present

## 2022-02-28 DIAGNOSIS — F17200 Nicotine dependence, unspecified, uncomplicated: Secondary | ICD-10-CM | POA: Diagnosis present

## 2022-02-28 DIAGNOSIS — R059 Cough, unspecified: Secondary | ICD-10-CM | POA: Diagnosis not present

## 2022-02-28 DIAGNOSIS — E872 Acidosis, unspecified: Secondary | ICD-10-CM | POA: Diagnosis not present

## 2022-02-28 DIAGNOSIS — E119 Type 2 diabetes mellitus without complications: Secondary | ICD-10-CM | POA: Insufficient documentation

## 2022-02-28 DIAGNOSIS — Z9189 Other specified personal risk factors, not elsewhere classified: Secondary | ICD-10-CM

## 2022-02-28 DIAGNOSIS — F17210 Nicotine dependence, cigarettes, uncomplicated: Secondary | ICD-10-CM | POA: Insufficient documentation

## 2022-02-28 DIAGNOSIS — I2510 Atherosclerotic heart disease of native coronary artery without angina pectoris: Secondary | ICD-10-CM | POA: Diagnosis not present

## 2022-02-28 DIAGNOSIS — R918 Other nonspecific abnormal finding of lung field: Secondary | ICD-10-CM | POA: Diagnosis not present

## 2022-02-28 DIAGNOSIS — N400 Enlarged prostate without lower urinary tract symptoms: Secondary | ICD-10-CM | POA: Diagnosis not present

## 2022-02-28 DIAGNOSIS — Z1152 Encounter for screening for COVID-19: Secondary | ICD-10-CM | POA: Insufficient documentation

## 2022-02-28 DIAGNOSIS — E1165 Type 2 diabetes mellitus with hyperglycemia: Secondary | ICD-10-CM

## 2022-02-28 DIAGNOSIS — R519 Headache, unspecified: Secondary | ICD-10-CM | POA: Diagnosis not present

## 2022-02-28 HISTORY — DX: Unspecified atrial fibrillation: I48.91

## 2022-02-28 LAB — RAPID URINE DRUG SCREEN, HOSP PERFORMED
Amphetamines: NOT DETECTED
Barbiturates: NOT DETECTED
Benzodiazepines: NOT DETECTED
Cocaine: NOT DETECTED
Opiates: POSITIVE — AB
Tetrahydrocannabinol: NOT DETECTED

## 2022-02-28 LAB — RESPIRATORY PANEL BY PCR

## 2022-02-28 LAB — CBC
HCT: 39.5 % (ref 39.0–52.0)
Hemoglobin: 12.7 g/dL — ABNORMAL LOW (ref 13.0–17.0)
MCH: 23.8 pg — ABNORMAL LOW (ref 26.0–34.0)
MCHC: 32.2 g/dL (ref 30.0–36.0)
MCV: 74 fL — ABNORMAL LOW (ref 80.0–100.0)
Platelets: 292 10*3/uL (ref 150–400)
RBC: 5.34 MIL/uL (ref 4.22–5.81)
RDW: 16 % — ABNORMAL HIGH (ref 11.5–15.5)
WBC: 14.8 10*3/uL — ABNORMAL HIGH (ref 4.0–10.5)
nRBC: 0 % (ref 0.0–0.2)

## 2022-02-28 LAB — DIFFERENTIAL
Abs Immature Granulocytes: 0.14 10*3/uL — ABNORMAL HIGH (ref 0.00–0.07)
Basophils Absolute: 0.1 10*3/uL (ref 0.0–0.1)
Basophils Relative: 1 %
Eosinophils Absolute: 0.2 10*3/uL (ref 0.0–0.5)
Eosinophils Relative: 1 %
Immature Granulocytes: 1 %
Lymphocytes Relative: 26 %
Lymphs Abs: 3.8 10*3/uL (ref 0.7–4.0)
Monocytes Absolute: 0.9 10*3/uL (ref 0.1–1.0)
Monocytes Relative: 6 %
Neutro Abs: 9.7 10*3/uL — ABNORMAL HIGH (ref 1.7–7.7)
Neutrophils Relative %: 65 %

## 2022-02-28 LAB — RESP PANEL BY RT-PCR (RSV, FLU A&B, COVID)  RVPGX2
Influenza A by PCR: NEGATIVE
Influenza B by PCR: NEGATIVE
Resp Syncytial Virus by PCR: NEGATIVE
SARS Coronavirus 2 by RT PCR: NEGATIVE

## 2022-02-28 LAB — BLOOD GAS, VENOUS
Acid-Base Excess: 2.2 mmol/L — ABNORMAL HIGH (ref 0.0–2.0)
Bicarbonate: 27.3 mmol/L (ref 20.0–28.0)
O2 Saturation: 73.7 %
Patient temperature: 37
pCO2, Ven: 43 mmHg — ABNORMAL LOW (ref 44–60)
pH, Ven: 7.41 (ref 7.25–7.43)
pO2, Ven: 43 mmHg (ref 32–45)

## 2022-02-28 LAB — URINALYSIS, ROUTINE W REFLEX MICROSCOPIC
Bilirubin Urine: NEGATIVE
Glucose, UA: NEGATIVE mg/dL
Hgb urine dipstick: NEGATIVE
Ketones, ur: NEGATIVE mg/dL
Leukocytes,Ua: NEGATIVE
Nitrite: NEGATIVE
Protein, ur: NEGATIVE mg/dL
Specific Gravity, Urine: 1.02 (ref 1.005–1.030)
pH: 5 (ref 5.0–8.0)

## 2022-02-28 LAB — I-STAT CHEM 8, ED
BUN: 23 mg/dL (ref 8–23)
Calcium, Ion: 1.14 mmol/L — ABNORMAL LOW (ref 1.15–1.40)
Chloride: 102 mmol/L (ref 98–111)
Creatinine, Ser: 1.2 mg/dL (ref 0.61–1.24)
Glucose, Bld: 84 mg/dL (ref 70–99)
HCT: 38 % — ABNORMAL LOW (ref 39.0–52.0)
Hemoglobin: 12.9 g/dL — ABNORMAL LOW (ref 13.0–17.0)
Potassium: 3.9 mmol/L (ref 3.5–5.1)
Sodium: 137 mmol/L (ref 135–145)
TCO2: 23 mmol/L (ref 22–32)

## 2022-02-28 LAB — TROPONIN I (HIGH SENSITIVITY)
Troponin I (High Sensitivity): 9 ng/L (ref <18)
Troponin I (High Sensitivity): 5 ng/L (ref <18)

## 2022-02-28 LAB — TSH: TSH: 2.179 u[IU]/mL (ref 0.350–4.500)

## 2022-02-28 LAB — CBG MONITORING, ED: Glucose-Capillary: 95 mg/dL (ref 70–99)

## 2022-02-28 LAB — CK: Total CK: 157 U/L (ref 49–397)

## 2022-02-28 LAB — COMPREHENSIVE METABOLIC PANEL
ALT: 39 U/L (ref 0–44)
AST: 42 U/L — ABNORMAL HIGH (ref 15–41)
Albumin: 3.6 g/dL (ref 3.5–5.0)
Alkaline Phosphatase: 62 U/L (ref 38–126)
Anion gap: 10 (ref 5–15)
BUN: 26 mg/dL — ABNORMAL HIGH (ref 8–23)
CO2: 22 mmol/L (ref 22–32)
Calcium: 8.8 mg/dL — ABNORMAL LOW (ref 8.9–10.3)
Chloride: 106 mmol/L (ref 98–111)
Creatinine, Ser: 1.17 mg/dL (ref 0.61–1.24)
GFR, Estimated: >60 mL/min (ref >60)
Glucose, Bld: 91 mg/dL (ref 70–99)
Potassium: 4 mmol/L (ref 3.5–5.1)
Sodium: 138 mmol/L (ref 135–145)
Total Bilirubin: 0.5 mg/dL (ref 0.3–1.2)
Total Protein: 6.7 g/dL (ref 6.5–8.1)

## 2022-02-28 LAB — PROTIME-INR
INR: 1.1 (ref 0.8–1.2)
Prothrombin Time: 14.3 seconds (ref 11.4–15.2)

## 2022-02-28 LAB — LACTIC ACID, PLASMA
Lactic Acid, Venous: 2.1 mmol/L (ref 0.5–1.9)
Lactic Acid, Venous: 1.4 mmol/L (ref 0.5–1.9)

## 2022-02-28 LAB — MAGNESIUM: Magnesium: 1.7 mg/dL (ref 1.7–2.4)

## 2022-02-28 LAB — AMMONIA: Ammonia: 23 umol/L (ref 9–35)

## 2022-02-28 LAB — ETHANOL: Alcohol, Ethyl (B): <10 mg/dL (ref <10)

## 2022-02-28 LAB — APTT: aPTT: 29 seconds (ref 24–36)

## 2022-02-28 MED ORDER — UMECLIDINIUM BROMIDE 62.5 MCG/ACT IN AEPB
1.0000 | INHALATION_SPRAY | Freq: Every day | RESPIRATORY_TRACT | Status: DC
  Administered 2022-03-01: 1 via RESPIRATORY_TRACT
  Filled 2022-02-28: qty 7

## 2022-02-28 MED ORDER — ONDANSETRON HCL 4 MG/2ML IJ SOLN: 4.0000 mg | Freq: Four times a day (QID) | INTRAMUSCULAR | Status: DC | PRN

## 2022-02-28 MED ORDER — ACETAMINOPHEN 325 MG PO TABS
650.0000 mg | ORAL_TABLET | Freq: Four times a day (QID) | ORAL | Status: DC | PRN
  Filled 2022-02-28: qty 2

## 2022-02-28 MED ORDER — VANCOMYCIN HCL 1500 MG/300ML IV SOLN: 1500.0000 mg | INTRAVENOUS | Status: DC

## 2022-02-28 MED ORDER — DIPHENHYDRAMINE HCL 50 MG/ML IJ SOLN
12.5000 mg | Freq: Once | INTRAMUSCULAR | Status: AC
  Administered 2022-02-28: 12.5 mg via INTRAVENOUS
  Filled 2022-02-28: qty 1

## 2022-02-28 MED ORDER — ONDANSETRON HCL 4 MG PO TABS: 4.0000 mg | ORAL_TABLET | Freq: Four times a day (QID) | ORAL | Status: DC | PRN

## 2022-02-28 MED ORDER — VANCOMYCIN HCL IN DEXTROSE 1-5 GM/200ML-% IV SOLN: 1000.0000 mg | Freq: Once | INTRAVENOUS | Status: DC

## 2022-02-28 MED ORDER — SODIUM CHLORIDE 0.9 % IV SOLN
2.0000 g | Freq: Three times a day (TID) | INTRAVENOUS | Status: DC
  Administered 2022-02-28 – 2022-03-01 (×2): 2 g via INTRAVENOUS
  Filled 2022-02-28 (×2): qty 12.5

## 2022-02-28 MED ORDER — ACETAMINOPHEN 500 MG PO TABS
1000.0000 mg | ORAL_TABLET | Freq: Once | ORAL | Status: DC
  Filled 2022-02-28: qty 2

## 2022-02-28 MED ORDER — TAMSULOSIN HCL 0.4 MG PO CAPS
0.4000 mg | ORAL_CAPSULE | Freq: Every day | ORAL | Status: DC
  Administered 2022-02-28 – 2022-03-01 (×2): 0.4 mg via ORAL
  Filled 2022-02-28 (×2): qty 1

## 2022-02-28 MED ORDER — ACETAMINOPHEN 650 MG RE SUPP: 650.0000 mg | Freq: Four times a day (QID) | RECTAL | Status: DC | PRN

## 2022-02-28 MED ORDER — OXYCODONE HCL 5 MG PO TABS
5.0000 mg | ORAL_TABLET | Freq: Four times a day (QID) | ORAL | Status: DC | PRN
  Administered 2022-03-01: 5 mg via ORAL
  Filled 2022-02-28: qty 1

## 2022-02-28 MED ORDER — LACTATED RINGERS IV SOLN: INTRAVENOUS | Status: DC

## 2022-02-28 MED ORDER — SODIUM CHLORIDE 0.9 % IV SOLN
2.0000 g | Freq: Once | INTRAVENOUS | Status: AC
  Administered 2022-02-28: 2 g via INTRAVENOUS
  Filled 2022-02-28: qty 12.5

## 2022-02-28 MED ORDER — SENNOSIDES-DOCUSATE SODIUM 8.6-50 MG PO TABS: 1.0000 | ORAL_TABLET | Freq: Two times a day (BID) | ORAL | Status: DC | PRN

## 2022-02-28 MED ORDER — PROCHLORPERAZINE EDISYLATE 10 MG/2ML IJ SOLN
10.0000 mg | Freq: Once | INTRAMUSCULAR | Status: AC
  Administered 2022-02-28: 10 mg via INTRAVENOUS
  Filled 2022-02-28: qty 2

## 2022-02-28 MED ORDER — PANTOPRAZOLE SODIUM 40 MG PO TBEC
40.0000 mg | DELAYED_RELEASE_TABLET | Freq: Every day | ORAL | Status: DC
  Administered 2022-03-01: 40 mg via ORAL
  Filled 2022-02-28: qty 1

## 2022-02-28 MED ORDER — NICOTINE 21 MG/24HR TD PT24
21.0000 mg | MEDICATED_PATCH | Freq: Every day | TRANSDERMAL | Status: DC
  Administered 2022-02-28 – 2022-03-01 (×2): 21 mg via TRANSDERMAL
  Filled 2022-02-28 (×2): qty 1

## 2022-02-28 MED ORDER — IPRATROPIUM-ALBUTEROL 0.5-2.5 (3) MG/3ML IN SOLN: 3.0000 mL | RESPIRATORY_TRACT | Status: DC | PRN

## 2022-02-28 MED ORDER — MOMETASONE FURO-FORMOTEROL FUM 200-5 MCG/ACT IN AERO
2.0000 | INHALATION_SPRAY | Freq: Two times a day (BID) | RESPIRATORY_TRACT | Status: DC
  Administered 2022-03-01: 2 via RESPIRATORY_TRACT
  Filled 2022-02-28: qty 8.8

## 2022-02-28 MED ORDER — LACTATED RINGERS IV BOLUS
1000.0000 mL | Freq: Once | INTRAVENOUS | Status: AC
  Administered 2022-02-28: 1000 mL via INTRAVENOUS

## 2022-02-28 MED ORDER — ASPIRIN 81 MG PO TBEC
81.0000 mg | DELAYED_RELEASE_TABLET | ORAL | Status: DC
  Administered 2022-02-28: 81 mg via ORAL
  Filled 2022-02-28: qty 1

## 2022-02-28 MED ORDER — UMECLIDINIUM BROMIDE 62.5 MCG/ACT IN AEPB: 1.0000 | INHALATION_SPRAY | Freq: Every day | RESPIRATORY_TRACT | Status: DC

## 2022-02-28 MED ORDER — ENOXAPARIN SODIUM 40 MG/0.4ML IJ SOSY
40.0000 mg | PREFILLED_SYRINGE | INTRAMUSCULAR | Status: DC
  Administered 2022-02-28: 40 mg via SUBCUTANEOUS
  Filled 2022-02-28: qty 0.4

## 2022-02-28 MED ORDER — VANCOMYCIN HCL 2000 MG/400ML IV SOLN
2000.0000 mg | Freq: Once | INTRAVENOUS | Status: AC
  Administered 2022-02-28: 2000 mg via INTRAVENOUS
  Filled 2022-02-28: qty 400

## 2022-02-28 MED ORDER — METRONIDAZOLE 500 MG/100ML IV SOLN
500.0000 mg | Freq: Once | INTRAVENOUS | Status: AC
  Administered 2022-02-28: 500 mg via INTRAVENOUS
  Filled 2022-02-28: qty 100

## 2022-02-28 MED ORDER — POLYETHYLENE GLYCOL 3350 17 G PO PACK: 17.0000 g | PACK | Freq: Two times a day (BID) | ORAL | Status: DC | PRN

## 2022-02-28 NOTE — H&P (Signed)
History and Physical    Patient: Michael Horn DVV:[id number] DOB: 1950/06/22 DOA: 02/28/2022 DOS: the patient was seen and examined on 02/28/2022 PCP: Mayra Neer, MD  Patient coming from: Home.  Lives with his wife.  Chief Complaint:  Chief Complaint  Patient presents with   Altered Mental Status   Arm Pain   Dizziness   Visual Field Change   HPI: Michael Horn is a 71 y.o. male with PMH of COPD, CAD, tobacco use disorder, IDDM-2, OSA on CPAP, osteoarthritis, BPH, bladder cancer, morbid obesity and chronic pain on opiates presenting with altered mental status, neck pain, dizziness and transient blurry vision.   History provided by patient and patient's wife at bedside.  Patient had sharp neck pain radiating to his left upper back, dizziness and blurry vision about 11 PM last night that lasted 15 to 20 minutes.  He had return of his symptoms about 11:30 AM this morning that prompted him to come to ED for evaluation.  Patient was recently diagnosed with pneumonia and treated with antibiotic.  He reports productive cough but denies chest pain or shortness of breath.  However, he has chronic dyspnea with exertion.  He also reports headache that has resolved.  He denies focal weakness.  He had left upper arm numbness that has resolved as well.  He admits to runny nose and some nose congestion but denies sore throat.  He denies nausea, vomiting or abdominal pain.  He denies UTI symptoms.  He denies new medication.  He lives with his wife.  Reports smoking 2 pack a day.  Denies drinking alcohol recreational drug use.  Likes to full code.  In ED, HR 112 but improved to 79.  RR 22 but improved to 18.  BP 168/125 and improved to 120s/70s.  Saturation 96% on RA.  Afebrile.  CMP without significant finding other than mild elevated AST to 42.  WBC 14.8 with left shift.  Lactic acid 2.1.  UA negative.  TSH normal.  COVID-19, influenza and RSV PCR nonreactive.  CT head and CXR without acute  finding.  UDS positive for opiates.  EKG features A-fib with RVR at the rate of 120.  Patient has no history of A-fib.  Received a liter of IV fluid bolus.  Blood and urine cultures ordered.  Started on IV cefepime and vancomycin.  Hospitalist service called for admission.  Review of Systems: As mentioned in the history of present illness. All other systems reviewed and are negative. Past Medical History:  Diagnosis Date   Arthritis    Arthritis -DDD."chronic pain med used"   Bladder tumor    states that it was cancer   Cancer ([place])    Bladder cancer"-Dr.Ottelin- checks every 3 months--no signs last checks.   Chronic knee pain RIGHT KNEE --  S/P KNEE REPLACEMENT JAN 2012--  PT STATES  NEEDS ANOTHER REPLACEMENT DUE TO JOINT RECALL   Complication of anesthesia EMERGENCE DELIRIUM   occurred x1- 2 yrs ago.few surgeries ago, none recently   COPD (chronic obstructive pulmonary disease) ([place])    Coronary artery disease    Depression    DM type 2 (diabetes mellitus, type 2) (HCC)    GERD (gastroesophageal reflux disease)    H/O hiatal hernia    Headache    History of bladder cancer TCC OF BLADDER  --  FOLLOWED BY DR Karsten Ro   History of peptic ulcer AGE 14   Hypertension    pt denies, takes lisimopril for kidneys  Mild obstructive sleep apnea    per study 09-26-2007--  no cpap rx   Nocturia    Peripheral neuropathy BOTTOM RIGHT FOOT   Seasonal allergies    Short of breath on exertion    Urgency of urination    Past Surgical History:  Procedure Laterality Date   BACK SURGERY  X2   lower back   BILATERAL KNEE ARTHROSCOPY     BILATERAL SHOULDER SURG.  2003   CARDIAC CATHETERIZATION  04-13-2007   ---- DR Irish Lack   NO SIGNIFICANT CAD/ NORMAL LVF   CARPAL TUNNEL RELEASE  04/11/2006   RIGHT   COLONOSCOPY WITH PROPOFOL N/A 05/01/2015   Procedure: COLONOSCOPY WITH PROPOFOL;  Surgeon: Garlan Fair, MD;  Location: [facility];  Service: Endoscopy;  Laterality: N/A;   CYSTOSCOPY W/  RETROGRADES Bilateral 12/23/2019   Procedure: CYSTOSCOPY WITH RETROGRADE PYELOGRAM;  Surgeon: Franchot Gallo, MD;  Location[facility];  Service: Urology;  Laterality: Bilateral;   HAND SURGERY Right    cyst removed   LEFT CARPAL. TUNNEL RELEASE     LEFT EYE SURG.   2008   REMOVAL OF BB   REPAIR RECURRENT LEFT ROTATOR CUFF TEAR  01/02/2011   REVERSE SHOULDER ARTHROPLASTY Left 04/08/2018   Procedure: REVERSE SHOULDER ARTHROPLASTY;  Surgeon: Nicholes Stairs, MD;  Location: [facility];  Service: Orthopedics;  Laterality: Left;  2.5 hours   SIGMOID COLECTOMY  09/29/2001   DIVERTICULITIS   TOTAL KNEE ARTHROPLASTY  04/03/2010   RIGHT   TRANSURETHRAL RESECTION OF BLADDER TUMOR  07/06/2010   TRANSURETHRAL RESECTION OF BLADDER TUMOR  05/20/2011   Procedure: TRANSURETHRAL RESECTION OF BLADDER TUMOR (TURBT);  Surgeon: Claybon Jabs, MD;  Location: [facility];  Service: Urology;  Laterality: N/A;  GYRUS INSTILL MYTOMYCIN C NO BED   TRANSURETHRAL RESECTION OF BLADDER TUMOR  01/31/2012   Procedure: TRANSURETHRAL RESECTION OF BLADDER TUMOR (TURBT);  Surgeon: Claybon Jabs, MD;  Location: [facility];  Service: Urology;  Laterality: N/A;   TRANSURETHRAL RESECTION OF BLADDER TUMOR WITH GYRUS (TURBT-GYRUS) N/A 11/22/2013   Procedure: Bladder biopsy with fulgeration;  Surgeon: Claybon Jabs, MD;  Location: [facility];  Service: Urology;  Laterality: N/A;   TRANSURETHRAL RESECTION OF BLADDER TUMOR WITH MITOMYCIN-C N/A 12/23/2019   Procedure: TRANSURETHRAL RESECTION OF BLADDER TUMOR WITH POST-OP  GEMCITABINE;  Surgeon: Franchot Gallo, MD;  Location: [facility];  Service: Urology;  Laterality: N/A;  14 MINS   Social History:  reports that he has been smoking cigarettes. He has a 120.00 pack-year smoking history. He has never used smokeless tobacco. He reports that he does not drink alcohol and does not use  drugs.  Allergies  Allergen Reactions   Aripiprazole     Other reaction(s): Unknown   Contrast Media [Iodinated Contrast Media] Rash   Dulaglutide     Other reaction(s): Unknown   Rosuvastatin     Other reaction(s): Unknown    Family History  Problem Relation Age of Onset   Heart attack Mother    Lung cancer Nephew    Colon cancer Neg Hx    Rectal cancer Neg Hx    Stomach cancer Neg Hx     Prior to Admission medications   Medication Sig Start Date End Date Taking? Authorizing Provider  albuterol (VENTOLIN HFA) 108 (90 Base) MCG/ACT inhaler Inhale 2 puffs into the lungs every 4 (four) hours as needed for wheezing or shortness of breath. 01/02/21  Yes  Spero Geralds, MD  aspirin EC 81 MG tablet Take 81 mg by mouth 4 (four) times a week. Swallow whole.   Yes [provider]  Budeson-Glycopyrrol-Formoterol (BREZTRI AEROSPHERE) 160-9-4.8 MCG/ACT AERO Inhale 2 puffs into the lungs in the morning and at bedtime. 01/29/22  Yes Spero Geralds, MD  diclofenac Sodium (VOLTAREN ARTHRITIS PAIN) 1 % GEL Apply 2 g topically 2 (two) times daily as needed (pain).   Yes [provider]  docusate sodium (COLACE) 100 MG capsule Take 100 mg by mouth daily as needed for mild constipation.   Yes [provider]  doxycycline (ADOXA) 100 MG tablet Take 100 mg by mouth 2 (two) times daily. 10 DS   Yes [provider]  esomeprazole (NEXIUM) 40 MG capsule TAKE 1 CAPSULE BY MOUTH ONCE DAILY (APPOINTMENT  NEEDED  FOR  FURTHER  REFILLS) Patient taking differently: Take 40 mg by mouth daily. 01/28/22  Yes Levin Erp, PA  ezetimibe (ZETIA) 10 MG tablet Take 10 mg by mouth at bedtime.   Yes [provider]  hyoscyamine (LEVSIN) 0.125 MG tablet Take 0.125 mg by mouth every 8 (eight) hours as needed for cramping.   Yes [provider]  insulin aspart protamine- aspart (NOVOLOG MIX 70/30) (70-30) 100 UNIT/ML injection Inject 55 Units into the skin 2  (two) times daily.   Yes [provider]  lisinopril (PRINIVIL,ZESTRIL) 10 MG tablet Take 10 mg by mouth every morning.   Yes [provider]  metFORMIN (GLUCOPHAGE) 1000 MG tablet Take 1,000 mg by mouth 2 (two) times daily.   Yes [provider]  Oxycodone HCl 10 MG TABS Take 10 mg by mouth every 6 (six) hours as needed (pain). 08/05/21  Yes [provider]  pravastatin (PRAVACHOL) 40 MG tablet Take 40 mg by mouth daily.   Yes [provider]  pregabalin (LYRICA) 150 MG capsule Take 150 mg by mouth 2 (two) times daily. 10/30/20  Yes [provider]  tamsulosin (FLOMAX) 0.4 MG CAPS capsule Take 0.4 mg by mouth every morning.   Yes [provider]  traZODone (DESYREL) 100 MG tablet Take 100 mg by mouth at bedtime.   Yes [provider]  trospium (SANCTURA) 20 MG tablet Take 20 mg by mouth 2 (two) times daily.   Yes [provider]    Physical Exam: Vitals:   02/28/22 1545 02/28/22 1600 02/28/22 1615 02/28/22 1700  BP: (!) 109/36 107/82 100/74 127/74  Pulse: 87 75 78 84  Resp: 19 (!) '25 18 19  '$ Temp:    98 F (36.7 C)  TempSrc:    Oral  SpO2: 94% 99% 96% 98%   GENERAL: No apparent distress.  Nontoxic. HEENT: MMM.  Vision and hearing grossly intact.  NECK: Supple.  No nuchal rigidity.  No apparent JVD but limited exam due to body habitus.  RESP:  No IWOB.  Fair aeration bilaterally. CVS:  RRR. Heart sounds normal.  ABD/GI/GU: BS+. Abd soft, NTND.  MSK/EXT:  Moves extremities. No apparent deformity. No edema.  SKIN: no apparent skin lesion or wound NEURO: Awake but not quite alert.  Oriented x 4.  Speech clear.  No facial asymmetry.  PERRL.  EOMI.  Motor 5/5 in all upper and lower extremities.  Patellar reflex metric.  Sensory intact. PSYCH: Calm. Normal affect.  Data Reviewed: Per HPI.  Assessment and Plan: Principal Problem:   SIRS (systemic inflammatory response syndrome) (HCC) Active Problems:    Obesity, unspecified  Tobacco use disorder   DOE (dyspnea on exertion)   OSA (obstructive sleep apnea)   SIRS: Unclear source of infection.  Patient has some respiratory symptoms including runny nose, congestion and productive cough with brownish to greenish phlegm.  Recently treated for pneumonia.  CXR without acute finding.  No oxygen requirement.  No GI or UTI symptoms.  No nuchal rigidity or meningeal sign.  Source improved with IV fluid hydration. -Continue IV cefepime and vancomycin -Check CT chest without contrast.  He has contrast allergy. -Follow-up blood and urine cultures -Continue gentle IV fluid -Check RVP -Hold home antihypertensive meds.  Recent diagnosis of pneumonia: Reportedly treated with 2 different antibiotics.  I only see doxycycline -Antibiotics as above.  New A-fib with mild RVR: Likely provoked in the setting of the above.  Converted back to sinus rhythm.  TTE in 06/2021 with normal LVEF and G1 DD.  TSH normal. -Telemetry monitoring -Optimize electrolytes  IDDM-2: A1c 7.2% in 2020.  Seems to be subcu insulin and metformin at home. -SSI-moderate -Check hemoglobin A1c  OSA on CPAP.  Does not consistently use CPAP -CPAP nightly  Chronic left shoulder pain and right knee pain: Reports taking oxycodone and Lyrica at home.  He is awake but not quite alert.  Oriented x 4. -Decrease home oxycodone to 5 mg every 6 hours -Hold Lyrica for now  Transient blurry vision/left arm numbness/headache: Resolved.  No focal neuro deficit on exam.  CT head without acute finding. -Monitor  Chronic COPD: Stable. -Continue home triple therapy -DuoNeb as needed -Encourage smoking cessation.  History of BPH/bladder cancer: Denies urinary symptoms. -Resume home meds.  Tobacco use disorder: Reports smoking about 2 packs a day. -Encouraged smoking cessation. -Nicotine patch  At risk for polypharmacy: On multiple sedating medication including oxycodone, Lyrica, trazodone and  Levsin. -Holding Lyrica, trazodone and Levsin for now -Decreased oxycodone to 5 mg every 6 hours as needed.   Advance Care Planning:   Code Status: Full Code   Consults: None  Family Communication: Updated patient's wife at bedside.  Severity of Illness: The appropriate patient status for this patient is OBSERVATION. Observation status is judged to be reasonable and necessary in order to provide the required intensity of service to ensure the patient's safety. The patient's presenting symptoms, physical exam findings, and initial radiographic and laboratory data in the context of their medical condition is felt to place them at decreased risk for further clinical deterioration. Furthermore, it is anticipated that the patient will be medically stable for discharge from the hospital within 2 midnights of admission.   Author: Mercy Riding, MD 02/28/2022 5:53 PM  For on call review [email or web address].

## 2022-02-28 NOTE — Progress Notes (Signed)
A consult was received from an ED physician for vanc/cefepime per pharmacy dosing.  The patient's profile has been reviewed for ht/wt/allergies/indication/available labs.   A one time order has been placed for vanc 2g and cefepime 2g.  Further antibiotics/pharmacy consults should be ordered by admitting physician if indicated.                       Thank you, Kara Mead 02/28/2022  2:56 PM

## 2022-02-28 NOTE — ED Triage Notes (Signed)
Patient here from home reporting dizziness, left arm numbness while driving to hospital, and confusion that started last night before bed. Denies n/v.

## 2022-02-28 NOTE — ED Provider Notes (Incomplete)
[facility] DEPT Provider Note   CSN: [id number] Arrival date & time: 02/28/22  1314     History {Add pertinent medical, surgical, social history, OB history to HPI:1} Chief Complaint  Patient presents with  . Altered Mental Status  . Arm Pain  . Dizziness  . Visual Field Change    Michael Horn is a 71 y.o. male with transitional cell carcinoma of bladder, obesity, COPD, HLD, tobacco use disorder, OSA presents with dizziness, arm pain.   Patient here from home reporting dizziness, left arm numbness while driving to hospital, and confusion that started last night before bed. Denies n/v.    Endorses "spine and neck" pain as well as a "splitting headache." Endorses blurry vision today bilaterally as well with intermittent tingling in his left arm. No trauma or history of similar. Has been recently treated for pneumonia with two antibiotics and prednisone. No fevers/chills that he is aware of. No nausea/vomiting, chest pain, SOB, photophobia, phonophobia, chest or abdominal pain, lower extremity numbness/weakness, urinary/bladder incontinence.    Altered Mental Status Arm Pain  Dizziness      Home Medications Prior to Admission medications   Medication Sig Start Date End Date Taking? Authorizing Provider  albuterol (VENTOLIN HFA) 108 (90 Base) MCG/ACT inhaler Inhale 2 puffs into the lungs every 4 (four) hours as needed for wheezing or shortness of breath. 01/02/21   Spero Geralds, MD  aspirin EC 81 MG tablet Take 81 mg by mouth daily. Swallow whole.    [provider]  Budeson-Glycopyrrol-Formoterol (BREZTRI AEROSPHERE) 160-9-4.8 MCG/ACT AERO Inhale 2 puffs into the lungs in the morning and at bedtime. 01/29/22   Spero Geralds, MD  esomeprazole (NEXIUM) 40 MG capsule TAKE 1 CAPSULE BY MOUTH ONCE DAILY (APPOINTMENT  NEEDED  FOR  FURTHER  REFILLS) 01/28/22   Levin Erp, PA  ezetimibe (ZETIA) 10 MG tablet Take 10 mg by mouth  at bedtime.    [provider]  insulin aspart protamine- aspart (NOVOLOG MIX 70/30) (70-30) 100 UNIT/ML injection Inject 44 Units into the skin 2 (two) times daily.     [provider]  lisinopril (PRINIVIL,ZESTRIL) 10 MG tablet Take 10 mg by mouth every morning.    [provider]  metFORMIN (GLUCOPHAGE) 1000 MG tablet Take 1,000 mg by mouth 2 (two) times daily.    [provider]  Oxycodone HCl 10 MG TABS Take 10 mg by mouth every 6 (six) hours as needed. 08/05/21   [provider]  pravastatin (PRAVACHOL) 40 MG tablet Take 40 mg by mouth daily.    [provider]  pregabalin (LYRICA) 100 MG capsule Take 100 mg by mouth 2 (two) times daily. 10/30/20   [provider]  tamsulosin (FLOMAX) 0.4 MG CAPS capsule Take 0.4 mg by mouth every morning.    [provider]  traZODone (DESYREL) 50 MG tablet Take 50 mg by mouth at bedtime.    [provider]  trospium (SANCTURA) 20 MG tablet Take 20 mg by mouth 2 (two) times daily.    [provider]      Allergies    Aripiprazole, Contrast media [iodinated contrast media], Dulaglutide, and Rosuvastatin    Review of Systems   Review of Systems  Neurological:  Positive for dizziness.   Review of systems {pos/neg:18640::"Negative","Positive"} for ***.  A 10 point review of systems was performed and is negative unless otherwise reported in HPI.  Physical Exam Updated Vital Signs BP (!) 86/47  Pulse (!) 104   Temp 97.8 F (36.6 C) (Oral)   Resp 12   SpO2 96%  Physical Exam General: Normal appearing {Desc; male/male:11659}, lying in bed.  HEENT: PERRLA, Sclera anicteric, MMM, trachea midline.  Cardiology: RRR, no murmurs/rubs/gallops. BL radial and DP pulses equal bilaterally.  Resp: Normal respiratory rate and effort. CTAB, no wheezes, rhonchi, crackles.  Abd: Soft, non-tender, non-distended. No rebound tenderness or guarding.  GU: Deferred. MSK: No  peripheral edema or signs of trauma. Extremities without deformity or TTP. No cyanosis or clubbing. Skin: warm, dry. No rashes or lesions. Back: No CVA tenderness Neuro: A&Ox4, CNs II-XII grossly intact. MAEs. Sensation grossly intact.  Psych: Normal mood and affect.   ED Results / Procedures / Treatments   Labs (all labs ordered are listed, but only abnormal results are displayed) Labs Reviewed  CBC - Abnormal; Notable for the following components:      Result Value   WBC 14.8 (*)    Hemoglobin 12.7 (*)    MCV 74.0 (*)    MCH 23.8 (*)    RDW 16.0 (*)    All other components within normal limits  DIFFERENTIAL - Abnormal; Notable for the following components:   Neutro Abs 9.7 (*)    Abs Immature Granulocytes 0.14 (*)    All other components within normal limits  URINALYSIS, ROUTINE W REFLEX MICROSCOPIC  COMPREHENSIVE METABOLIC PANEL  ETHANOL  PROTIME-INR  APTT  RAPID URINE DRUG SCREEN, HOSP PERFORMED  TSH  MAGNESIUM  I-STAT CHEM 8, ED  CBG MONITORING, ED  TROPONIN I (HIGH SENSITIVITY)    EKG EKG Interpretation  Date/Time:  Thursday February 28 2022 13:27:47 EST Ventricular Rate:  120 PR Interval:    QRS Duration: 99 QT Interval:  314 QTC Calculation: 444 R Axis:   70 Text Interpretation: Atrial fibrillation with RVR Ventricular premature complex Minimal ST depression, inferior leads Confirmed by Cindee Lame [id number]) on 02/28/2022 2:37:21 PM  Radiology No results found.  Procedures Procedures  {Document cardiac monitor, telemetry assessment procedure when appropriate:1}  Medications Ordered in ED Medications  prochlorperazine (COMPAZINE) injection 10 mg (has no administration in time range)  lactated ringers bolus 1,000 mL (has no administration in time range)  diphenhydrAMINE (BENADRYL) injection 12.5 mg (has no administration in time range)  acetaminophen (TYLENOL) tablet 1,000 mg (has no administration in time range)    ED Course/ Medical Decision  Making/ A&P                          Medical Decision Making Amount and/or Complexity of Data Reviewed Labs: ordered. Decision-making details documented in ED Course. Radiology: ordered.  Risk Prescription drug management. Decision regarding hospitalization.    This patient presents to the ED for concern of AMS, headache, confusion; this involves an extensive number of treatment options, and is a complaint that carries with it a high risk of complications and morbidity.  I considered the following differential and admission for this acute, potentially life threatening condition.   MDM:    Patient with tachycardia, recently on abx for PNA, and one pressure in 21Y systolic. C/f septic shock. Will start sepsis protocol and obtain labs, give fluids and broad spectrum abx.   Ddx of acute altered mental status or encephalopathy considered but not limited to: -Intracranial abnormalities such as ICH, hydrocephalus. No known head trauma, does have "splitting" headache and will obtain CTH noncon. Does have contrast allergy so cannot get CTA at this time.  Given report of tingling in LUE, consider CVA or ICH*** -Infection such as UTI, PNA, or meningitis. Patient does complain of headache and blurred vision, consider meningitis though he has no neck stiffness or meningismus, and negative brudzinski signs. Endorses "spine pain," will evaluate for other sources of infection and if negative can consider LP. Recently treated for pneumonia, consider worsening of the PNA and will obtain CXR. -Toxic ingestion such as opioid overdose, anticholinergic toxicity -Electrolyte abnormalities or hyper/hypoglycemia -ACS or arrhythmia - patient with new Afib w/ RVR. No h/o afib per chart review, does not take any anticoagulation. -Endocrine abnormality such as thyroid storm or myxedema coma   Clinical Course as of 03/25/22 1709  Thu Feb 28, 2022  1449 WBC(!): 14.8 [HN]  1450 Hemoglobin(!): 12.7 [HN]  1450  Urinalysis, Routine w reflex microscopic Urine, Clean Catch Neg for UTI [HN]  1657 Lactic acid level is elevated.  Will give additional fluid bolus. [RA]  1518 Head CT without acute findings.  Chest x-ray not suggestive of pneumonia.  Urinalysis does not show UTI [JK]    Clinical Course User Index [HN] Audley Hose, MD [JK] Dorie Rank, MD     Labs: I Ordered, and personally interpreted labs.  The pertinent results include:  those listed above, many results pending  Imaging Studies ordered: I ordered imaging studies including CTH, CXR  Additional history obtained from chart review, wife at bedside.   Cardiac Monitoring: .The patient was maintained on a cardiac monitor.  I personally viewed and interpreted the cardiac monitored which showed an underlying rhythm of: afib w/ RVR  Reevaluation: After the interventions noted above, I reevaluated the patient and found that they have :{resolved/improved/worsened:23923::"improved"}  Social Determinants of Health: .Patient lives independently   Disposition:  ***  Co morbidities that complicate the patient evaluation . Past Medical History:  Diagnosis Date  . Arthritis    Arthritis -DDD."chronic pain med used"  . Bladder tumor    states that it was cancer  . Cancer (HCC)    Bladder cancer"-Dr.Ottelin- checks every 3 months--no signs last checks.  . Chronic knee pain RIGHT KNEE --  S/P KNEE REPLACEMENT JAN 2012--  PT STATES  NEEDS ANOTHER REPLACEMENT DUE TO JOINT RECALL  . Complication of anesthesia EMERGENCE DELIRIUM   occurred x1- 2 yrs ago.few surgeries ago, none recently  . COPD (chronic obstructive pulmonary disease) ([place])   . Coronary artery disease   . Depression   . DM type 2 (diabetes mellitus, type 2) ([place])   . GERD (gastroesophageal reflux disease)   . H/O hiatal hernia   . Headache   . History of bladder cancer TCC OF BLADDER  --  FOLLOWED BY DR Karsten Ro  . History of peptic ulcer AGE 30  . Hypertension    pt  denies, takes lisimopril for kidneys  . Mild obstructive sleep apnea    per study 09-26-2007--  no cpap rx  . Nocturia   . Peripheral neuropathy BOTTOM RIGHT FOOT  . Seasonal allergies   . Short of breath on exertion   . Urgency of urination      Medicines Meds ordered this encounter  Medications  . prochlorperazine (COMPAZINE) injection 10 mg  . lactated ringers bolus 1,000 mL  . diphenhydrAMINE (BENADRYL) injection 12.5 mg  . acetaminophen (TYLENOL) tablet 1,000 mg    I have reviewed the patients home medicines and have made adjustments as needed  Problem List / ED Course: Problem List Items Addressed This Visit   None  Clinical Course as of 03/25/22 1709  Thu Feb 28, 2022  1449 WBC(!): 14.8 [HN]  1450 Hemoglobin(!): 12.7 [HN]  1450 Urinalysis, Routine w reflex microscopic Urine, Clean Catch Neg for UTI [HN]  1657 Lactic acid level is elevated.  Will give additional fluid bolus. [HC]  6237 Head CT without acute findings.  Chest x-ray not suggestive of pneumonia.  Urinalysis does not show UTI [JK]    Clinical Course User Index [HN] Audley Hose, MD [JK] Dorie Rank, MD    {Document critical care time when appropriate:1} {Document review of labs and clinical decision tools ie heart score, Chads2Vasc2 etc:1}  {Document your independent review of radiology images, and any outside records:1} {Document your discussion with family members, caretakers, and with consultants:1} {Document social determinants of health affecting pt's care:1} {Document your decision making why or why not admission, treatments were needed:1}  This note was created using dictation software, which may contain spelling or grammatical errors.

## 2022-02-28 NOTE — Progress Notes (Signed)
Pt refused CPAP qhs.  Pt states that he only recently started wearing CPAP but has been unable to tolerate it since having the Flu recently.  Pt encouraged to contact RT should he change his mind.

## 2022-02-28 NOTE — Progress Notes (Signed)
Elink following code sepsis °

## 2022-02-28 NOTE — Progress Notes (Signed)
Notified provider and bedside nurse of need to order and administer fluid bolus. Pt needs 3678 cc fluid

## 2022-02-28 NOTE — Progress Notes (Signed)
Pharmacy Antibiotic Note  Michael Horn is a 71 y.o. male admitted on 02/28/2022 with sepsis.  Pharmacy has been consulted for vanc/cefepime dosing.  Plan: Vanc 2g x 1 already given then start '1500mg'$  IV q24 Cefepime 2g IV q8 per current renal function  Weight: 122 kg (268 lb 15.4 oz) (from October 2023)  Temp (24hrs), Avg:97.9 F (36.6 C), Min:97.8 F (36.6 C), Max:98 F (36.7 C)  Recent Labs  Lab 02/28/22 1335 02/28/22 1503 02/28/22 1558 02/28/22 1733  WBC 14.8*  --   --   --   CREATININE 1.17 1.20  --   --   LATICACIDVEN  --   --  2.1* 1.4    Estimated Creatinine Clearance: 72.9 mL/min (by C-G formula based on SCr of 1.2 mg/dL).    Allergies  Allergen Reactions   Aripiprazole     Other reaction(s): Unknown   Contrast Media [Iodinated Contrast Media] Rash   Dulaglutide     Other reaction(s): Unknown   Rosuvastatin     Other reaction(s): Unknown     Thank you for allowing pharmacy to be a part of this patient's care.  Kara Mead 02/28/2022 6:17 PM

## 2022-02-28 NOTE — ED Provider Notes (Signed)
Admit pending labs, imaging New a fib  Initially seen by Dr. Wadie Lessen.  Please see her note.  Patient was pending laboratory and imaging workup.  Did present to the ED for hypotension and confusion.  Presentation concerning for the possibility of having sepsis.  Patient was started on empiric antibiotics and fluids.  No confirmed signs of infection at this time.  Cultures are pending.  Will plan on consult with the medical service for admission and further evaluation. Case discussed with Dr Cyndia Skeeters regarding admission  Clinical Course as of 02/28/22 1701  Thu Feb 28, 2022  1449 WBC(!): 14.8 [HN]  1450 Hemoglobin(!): 12.7 [HN]  1450 Urinalysis, Routine w reflex microscopic Urine, Clean Catch Neg for UTI [HN]  1657 Lactic acid level is elevated.  Will give additional fluid bolus. [BD]  5329 Head CT without acute findings.  Chest x-ray not suggestive of pneumonia.  Urinalysis does not show UTI [JK]    Clinical Course User Index [HN] Audley Hose, MD [JK] Dorie Rank, MD      Dorie Rank, MD 03/01/22 [id number]

## 2022-03-01 DIAGNOSIS — G4733 Obstructive sleep apnea (adult) (pediatric): Secondary | ICD-10-CM | POA: Diagnosis not present

## 2022-03-01 DIAGNOSIS — Z9189 Other specified personal risk factors, not elsewhere classified: Secondary | ICD-10-CM | POA: Diagnosis not present

## 2022-03-01 DIAGNOSIS — Z8551 Personal history of malignant neoplasm of bladder: Secondary | ICD-10-CM | POA: Diagnosis not present

## 2022-03-01 DIAGNOSIS — E119 Type 2 diabetes mellitus without complications: Secondary | ICD-10-CM | POA: Diagnosis not present

## 2022-03-01 DIAGNOSIS — R0609 Other forms of dyspnea: Secondary | ICD-10-CM | POA: Diagnosis not present

## 2022-03-01 DIAGNOSIS — Z794 Long term (current) use of insulin: Secondary | ICD-10-CM | POA: Diagnosis not present

## 2022-03-01 DIAGNOSIS — F17200 Nicotine dependence, unspecified, uncomplicated: Secondary | ICD-10-CM | POA: Diagnosis not present

## 2022-03-01 DIAGNOSIS — R6510 Systemic inflammatory response syndrome (SIRS) of non-infectious origin without acute organ dysfunction: Secondary | ICD-10-CM | POA: Diagnosis not present

## 2022-03-01 LAB — CBC
HCT: 36.6 % — ABNORMAL LOW (ref 39.0–52.0)
Hemoglobin: 11.6 g/dL — ABNORMAL LOW (ref 13.0–17.0)
MCH: 23.5 pg — ABNORMAL LOW (ref 26.0–34.0)
MCHC: 31.7 g/dL (ref 30.0–36.0)
MCV: 74.1 fL — ABNORMAL LOW (ref 80.0–100.0)
Platelets: 215 10*3/uL (ref 150–400)
RBC: 4.94 MIL/uL (ref 4.22–5.81)
RDW: 16 % — ABNORMAL HIGH (ref 11.5–15.5)
WBC: 8.5 10*3/uL (ref 4.0–10.5)
nRBC: 0 % (ref 0.0–0.2)

## 2022-03-01 LAB — RENAL FUNCTION PANEL
Albumin: 3.6 g/dL (ref 3.5–5.0)
Anion gap: 9 (ref 5–15)
BUN: 15 mg/dL (ref 8–23)
CO2: 24 mmol/L (ref 22–32)
Calcium: 9.2 mg/dL (ref 8.9–10.3)
Chloride: 103 mmol/L (ref 98–111)
Creatinine, Ser: 0.96 mg/dL (ref 0.61–1.24)
GFR, Estimated: >60 mL/min (ref >60)
Glucose, Bld: 243 mg/dL — ABNORMAL HIGH (ref 70–99)
Phosphorus: 3.2 mg/dL (ref 2.5–4.6)
Potassium: 3.9 mmol/L (ref 3.5–5.1)
Sodium: 136 mmol/L (ref 135–145)

## 2022-03-01 LAB — URINE CULTURE

## 2022-03-01 LAB — MAGNESIUM: Magnesium: 1.8 mg/dL (ref 1.7–2.4)

## 2022-03-01 MED ORDER — PREGABALIN 50 MG PO CAPS
100.0000 mg | ORAL_CAPSULE | Freq: Two times a day (BID) | ORAL | Status: DC
  Administered 2022-03-01: 100 mg via ORAL
  Filled 2022-03-01: qty 2

## 2022-03-01 MED ORDER — LISINOPRIL 10 MG PO TABS
10.0000 mg | ORAL_TABLET | Freq: Every morning | ORAL | Status: DC
  Administered 2022-03-01: 10 mg via ORAL
  Filled 2022-03-01: qty 1

## 2022-03-01 MED ORDER — MELATONIN 5 MG PO TABS
10.0000 mg | ORAL_TABLET | Freq: Every evening | ORAL | Status: DC | PRN
  Administered 2022-03-01: 10 mg via ORAL
  Filled 2022-03-01: qty 2

## 2022-03-01 MED ORDER — FESOTERODINE FUMARATE ER 4 MG PO TB24
4.0000 mg | ORAL_TABLET | Freq: Every day | ORAL | Status: DC
  Filled 2022-03-01: qty 1

## 2022-03-01 MED ORDER — TRAZODONE HCL 100 MG PO TABS: 100.0000 mg | ORAL_TABLET | Freq: Every day | ORAL | Status: DC

## 2022-03-01 MED ORDER — SENNOSIDES-DOCUSATE SODIUM 8.6-50 MG PO TABS: 1.0000 | ORAL_TABLET | Freq: Two times a day (BID) | ORAL | 0 refills | Status: AC | PRN

## 2022-03-01 MED ORDER — EZETIMIBE 10 MG PO TABS: 10.0000 mg | ORAL_TABLET | Freq: Every day | ORAL | Status: DC

## 2022-03-01 MED ORDER — NICOTINE 21 MG/24HR TD PT24: 21.0000 mg | MEDICATED_PATCH | Freq: Every day | TRANSDERMAL | 0 refills | Status: DC

## 2022-03-01 MED ORDER — PRAVASTATIN SODIUM 40 MG PO TABS
40.0000 mg | ORAL_TABLET | Freq: Every day | ORAL | Status: DC
  Administered 2022-03-01: 40 mg via ORAL
  Filled 2022-03-01: qty 1

## 2022-03-01 NOTE — Plan of Care (Signed)
Received patient from ED, alert and oriented x 4. Reports pain on back/ left side, medicated per MAR. Episode of restlessness, verbalized he couldn't sleep, requested for trazodone, NP A. Zebedee Iba made aware. Melatonin given. Ambulated from stretcher to bed with 1 assist. Tele and pulse ox in placed. Oriented to room and staff. VS monitored. Safety and fall precautions observed. Call bell within reach.   Problem: Education: Goal: Knowledge of General Education information will improve Description: Including pain rating scale, medication(s)/side effects and non-pharmacologic comfort measures Outcome: Progressing   Problem: Health Behavior/Discharge Planning: Goal: Ability to manage health-related needs will improve Outcome: Progressing   Problem: Clinical Measurements: Goal: Ability to maintain clinical measurements within normal limits will improve Outcome: Progressing Goal: Will remain free from infection Outcome: Progressing   Problem: Activity: Goal: Risk for activity intolerance will decrease Outcome: Progressing   Problem: Nutrition: Goal: Adequate nutrition will be maintained Outcome: Progressing   Problem: Pain Managment: Goal: General experience of comfort will improve Outcome: Progressing   Problem: Safety: Goal: Ability to remain free from injury will improve Outcome: Progressing   Problem: Skin Integrity: Goal: Risk for impaired skin integrity will decrease Outcome: Progressing

## 2022-03-01 NOTE — Progress Notes (Signed)
  Transition of Care [facility]) Screening Note   Patient Details  Name: Michael Horn Date of Birth: [birth year]   Transition of Care [facility]) CM/SW Contact:    Vassie Moselle, LCSW Phone Number: 03/01/2022, 10:46 AM    Transition of Care Department [facility]) has reviewed patient and no TOC needs have been identified at this time. We will continue to monitor patient advancement through interdisciplinary progression rounds. If new patient transition needs arise, please place a TOC consult.

## 2022-03-01 NOTE — Progress Notes (Signed)
PT Cancellation Note  Patient Details Name: MOISHE SCHELLENBERG MRN: [id number] DOB: Dec 18, 1950   Cancelled Treatment:     OT assessed patient and identified no PT needs at this time. Sign off.    Clide Dales 03/01/2022, 9:26 AM

## 2022-03-01 NOTE — Discharge Summary (Signed)
Physician Discharge Summary  Michael Horn NWG:[id number] DOB: 04-Apr-1950 DOA: 02/28/2022  PCP: Mayra Neer, MD  Admit date: 02/28/2022 Discharge date: 03/01/2022 Admitted From: Home Disposition: Home Recommendations for Outpatient Follow-up:  Follow up with PCP in 1 week Outpatient follow-up with cardiology in 1 to 2 weeks.  May benefit from heart monitor to assess A-fib burden Check CMP CBC and hemoglobin A1c Encourage smoking cessation.  May consider pharmacologic intervention Please follow up on the following pending results: None  Home Health: Not indicated Equipment/Devices: Not indicated  Discharge Condition: Stable CODE STATUS: Full code  Follow-up Information     Mayra Neer, MD. Schedule an appointment as soon as possible for a visit in 1 week(s).   Specialty: Family Medicine Contact information: [address] [place] [address] [phone]                 Hospital course 71 y.o. male with PMH of COPD, CAD, tobacco use disorder, IDDM-2, OSA on CPAP, osteoarthritis, BPH, bladder cancer, morbid obesity and chronic pain on opiates presenting with transient altered mental status, neck pain, left upper back pain, left arm pain, dizziness and blurry vision. In ED, HR 112 but improved to 79.  RR 22 but improved to 18.  BP 168/125 and improved to 120s/70s.  Saturation 96% on RA.  Afebrile.  CMP without significant finding other than mild elevated AST to 42.  WBC 14.8 with left shift.  Lactic acid 2.1 improved to 1.4.  UA negative.  TSH normal.  COVID-19, influenza and RSV PCR nonreactive.  CT head and CXR without acute finding.  UDS positive for opiates.  EKG features A-fib with RVR at the rate of 120.  Patient has no history of A-fib.  Received a liter of IV fluid bolus.  Blood and urine cultures ordered.  Started on IV cefepime and vancomycin due to concern for sepsis.Marland Kitchen  Hospitalist service called for admission.   On admission, patient  is awake but not quite alert.  He is oriented x 4.  Full neuroexam.in normal sinus rhythm.  CT chest without contrast and VBG.  He was continued on IV cefepime and vancomycin overnight.   The next day, patient has not had recurrence of his symptoms.  Blood cultures NGTD.  He feels well and ready to go home.  He is discharged to follow-up with PCP and cardiology.  Extensive discussion about risk of polypharmacy and the importance of using his CPAP for sleep apnea.   In regards to his transient A-fib, encourage him to wear his CPAP.  Patient has a cardiologist.  Encouraged to follow-up with cardiology.  He might benefit from heart monitor to assess his A-fib burden.  See individual problem list below for more.   Problems addressed during this hospitalization Principal Problem:   SIRS (systemic inflammatory response syndrome) (HCC) Active Problems:   Morbid obesity (HCC)   Tobacco use disorder   DOE (dyspnea on exertion)   OSA (obstructive sleep apnea)   History of bladder cancer   At risk for polypharmacy   Insulin dependent type 2 diabetes mellitus ([place]) Sepsis ruled out.  Vital signs Vitals:   03/01/22 0645 03/01/22 0915 03/01/22 0916 03/01/22 1054  BP: (!) 177/88   (!) 152/77  Pulse: 83   77  Temp: 98.7 F (37.1 C)   98.3 F (36.8 C)  Resp: 19   (!) 24  Height:      Weight:      SpO2: 96% 96%  96% 96%  TempSrc: Oral   Oral  BMI (Calculated):         Discharge exam  GENERAL: No apparent distress.  Nontoxic. HEENT: MMM.  Vision and hearing grossly intact.  NECK: Supple.  No apparent JVD.  RESP:  No IWOB.  Fair aeration bilaterally. CVS:  RRR. Heart sounds normal.  ABD/GI/GU: BS+. Abd soft, NTND.  MSK/EXT:  Moves extremities. No apparent deformity. No edema.  SKIN: no apparent skin lesion or wound NEURO: Awake and alert. Oriented appropriately.  No apparent focal neuro deficit. PSYCH: Calm. Normal affect.   Discharge Instructions Discharge Instructions     Call MD  for:  difficulty breathing, headache or visual disturbances   Complete by: As directed    Call MD for:  extreme fatigue   Complete by: As directed    Call MD for:  persistant dizziness or light-headedness   Complete by: As directed    Call MD for:  severe uncontrolled pain   Complete by: As directed    Call MD for:  temperature >100.4   Complete by: As directed    Diet - low sodium heart healthy   Complete by: As directed    Diet Carb Modified   Complete by: As directed    Discharge instructions   Complete by: As directed    It has been a pleasure taking care of you!  You were hospitalized due to transient headache, neck pain, dizziness, confusion and vision change.  Initially, there was concern about possible infection which seems to have been excluded based on the tests we have run.  Your symptoms resolved as well.   Your initial EKG showed brief atrial fibrillation (irregularly irregular and fast heart rhythm) that has resolved.  Untreated sleep apnea can increase your risk of atrial fibrillation.  Please follow-up with your cardiologist for further evaluation and management of atrial fibrillation.   Be aware that oxycodone, Lyrica, trazodone and Levsin are addictive and can cause problems including drowsiness, sedation, constipation, impaired judgment, respiratory depression that could potentially lead to death and impaired balance that could increase risk of fall.  We strongly recommend cutting down on these medications or talking to your doctors who prescribe them.  We do not recommend driving, operating machinery or other activity that requires similar mental and physical engagement.    We also recommend wearing your CPAP machine when you sleep.   Take care,   Increase activity slowly   Complete by: As directed       Allergies as of 03/01/2022       Reactions   Aripiprazole    Other reaction(s): Unknown   Contrast Media [iodinated Contrast Media] Rash   Dulaglutide     Other reaction(s): Unknown   Rosuvastatin    Other reaction(s): Unknown        Medication List     TAKE these medications    albuterol 108 (90 Base) MCG/ACT inhaler Commonly known as: VENTOLIN HFA Inhale 2 puffs into the lungs every 4 (four) hours as needed for wheezing or shortness of breath.   aspirin EC 81 MG tablet Take 81 mg by mouth 4 (four) times a week. Swallow whole.   Breztri Aerosphere 160-9-4.8 MCG/ACT Aero Generic drug: Budeson-Glycopyrrol-Formoterol Inhale 2 puffs into the lungs in the morning and at bedtime.   docusate sodium 100 MG capsule Commonly known as: COLACE Take 100 mg by mouth daily as needed for mild constipation.   doxycycline 100 MG tablet Commonly known as: ADOXA Take  100 mg by mouth 2 (two) times daily. 10 DS   esomeprazole 40 MG capsule Commonly known as: NEXIUM TAKE 1 CAPSULE BY MOUTH ONCE DAILY (APPOINTMENT  NEEDED  FOR  FURTHER  REFILLS) What changed: See the new instructions.   ezetimibe 10 MG tablet Commonly known as: ZETIA Take 10 mg by mouth at bedtime.   hyoscyamine 0.125 MG tablet Commonly known as: LEVSIN Take 0.125 mg by mouth every 8 (eight) hours as needed for cramping.   insulin aspart protamine- aspart (70-30) 100 UNIT/ML injection Commonly known as: NOVOLOG MIX 70/30 Inject 55 Units into the skin 2 (two) times daily.   lisinopril 10 MG tablet Commonly known as: ZESTRIL Take 10 mg by mouth every morning.   metFORMIN 1000 MG tablet Commonly known as: GLUCOPHAGE Take 1,000 mg by mouth 2 (two) times daily.   nicotine 21 mg/24hr patch Commonly known as: NICODERM CQ - dosed in mg/24 hours Place 1 patch (21 mg total) onto the skin daily. Start taking on: March 02, 2022   Oxycodone HCl 10 MG Tabs Take 10 mg by mouth every 6 (six) hours as needed (pain).   pravastatin 40 MG tablet Commonly known as: PRAVACHOL Take 40 mg by mouth daily.   pregabalin 150 MG capsule Commonly known as: LYRICA Take 150 mg by  mouth 2 (two) times daily.   senna-docusate 8.6-50 MG tablet Commonly known as: Senokot-S Take 1 tablet by mouth 2 (two) times daily between meals as needed for moderate constipation.   tamsulosin 0.4 MG Caps capsule Commonly known as: FLOMAX Take 0.4 mg by mouth every morning.   traZODone 100 MG tablet Commonly known as: DESYREL Take 100 mg by mouth at bedtime.   trospium 20 MG tablet Commonly known as: SANCTURA Take 20 mg by mouth 2 (two) times daily.   Voltaren Arthritis Pain 1 % Gel Generic drug: diclofenac Sodium Apply 2 g topically 2 (two) times daily as needed (pain).        Consultations: None  Procedures/Studies:    CT CHEST WO CONTRAST  Result Date: 02/28/2022 CLINICAL DATA:  Dyspnea EXAM: CT CHEST WITHOUT CONTRAST TECHNIQUE: Multidetector CT imaging of the chest was performed following the standard protocol without IV contrast. RADIATION DOSE REDUCTION: This exam was performed according to the departmental dose-optimization program which includes automated exposure control, adjustment of the mA and/or kV according to patient size and/or use of iterative reconstruction technique. COMPARISON:  Chest x-ray 02/28/2022, chest CT 01/13/2014 FINDINGS: Cardiovascular: Limited evaluation due to absence of contrast. Moderate aortic atherosclerosis. No aneurysm. Coronary vascular calcification. Normal cardiac size. No pericardial effusion Mediastinum/Nodes: Midline trachea. No thyroid mass. No suspicious lymph nodes. Esophagus within normal limits. Lungs/Pleura: Emphysema. No consolidative airspace disease, pleural effusion, or pneumothorax. Multiple punctate less than 5 mm nodules most evident in the apices/upper lobes. Upper Abdomen: No acute abnormality. Musculoskeletal: No chest wall mass or suspicious bone lesions identified. IMPRESSION: 1. Emphysema. No acute airspace disease. 2. Multiple punctate less than 5 mm bilateral pulmonary nodules. No follow-up needed if patient is  low-risk (and has no known or suspected primary neoplasm). Non-contrast chest CT can be considered in 12 months if patient is high-risk. This recommendation follows the consensus statement: Guidelines for Management of Incidental Pulmonary Nodules Detected on CT Images: From the Fleischner Society 2017; Radiology 2017; 284:228-243. 3. Aortic atherosclerosis. Aortic Atherosclerosis (ICD10-I70.0) and Emphysema (ICD10-J43.9). Electronically Signed   By: Donavan Foil M.D.   On: 02/28/2022 18:54   CT Head Wo Contrast  Result  Date: 02/28/2022 CLINICAL DATA:  Headaches, presyncope EXAM: CT HEAD WITHOUT CONTRAST TECHNIQUE: Contiguous axial images were obtained from the base of the skull through the vertex without intravenous contrast. RADIATION DOSE REDUCTION: This exam was performed according to the departmental dose-optimization program which includes automated exposure control, adjustment of the mA and/or kV according to patient size and/or use of iterative reconstruction technique. COMPARISON:  05/05/2017 FINDINGS: Brain: No acute intracranial findings are seen. There are no signs of bleeding within the cranium. Cortical sulci are prominent. There is decreased density in periventricular white matter. Vascular: Unremarkable. Skull: Unremarkable. Sinuses/Orbits: There is mucosal thickening in sphenoid, ethmoid and left maxillary sinuses. There is possible old blowout fracture in the inferior aspect of medial wall of left orbit. There is mild inferior bulging of floor of right orbit. These findings have not changed since 05/04/2017 suggesting possible remote injuries. Other: None. IMPRESSION: No acute intracranial findings are seen in noncontrast CT brain. Atrophy. Small-vessel disease. Chronic sinusitis. Electronically Signed   By: Elmer Picker M.D.   On: 02/28/2022 16:50   DG Chest Portable 1 View  Result Date: 02/28/2022 CLINICAL DATA:  Recent treatment for pneumonia EXAM: PORTABLE CHEST 1 VIEW  COMPARISON:  01/26/2020 FINDINGS: Left shoulder replacement. No focal opacity, pleural effusion, or pneumothorax. Normal cardiomediastinal silhouette. Aortic atherosclerosis IMPRESSION: No active disease. Electronically Signed   By: Donavan Foil M.D.   On: 02/28/2022 16:08       The results of significant diagnostics from this hospitalization (including imaging, microbiology, ancillary and laboratory) are listed below for reference.     Microbiology: Recent Results (from the past 240 hour(s))  Urine Culture     Status: Abnormal   Collection Time: 02/28/22  2:53 PM   Specimen: In/Out Cath Urine  Result Value Ref Range Status   Specimen Description   Final    IN/OUT CATH URINE Performed at [facility], [place] [address], [place], [place] [address]    Special Requests   Final    NONE Performed at [facility], [place] [address], [place], [place] [address]    Culture MULTIPLE SPECIES PRESENT, SUGGEST RECOLLECTION (A)  Final   Report Status 03/01/2022 FINAL  Final  Blood Culture (routine x 2)     Status: None (Preliminary result)   Collection Time: 02/28/22  3:25 PM   Specimen: BLOOD RIGHT FOREARM  Result Value Ref Range Status   Specimen Description   Final    BLOOD RIGHT FOREARM Performed at [facility], [place] [address], [place], [place] [address]    Special Requests   Final    BOTTLES DRAWN AEROBIC AND ANAEROBIC Blood Culture adequate volume Performed at [place] [address], [place], [place] [address]    Culture   Final    NO GROWTH < 24 HOURS Performed at [place] [address], [place], [place] [address]    Report Status PENDING  Incomplete  Blood Culture (routine x 2)     Status: None (Preliminary result)   Collection Time: 02/28/22  3:32 PM   Specimen: BLOOD LEFT FOREARM  Result Value Ref Range Status   Specimen Description   Final    BLOOD LEFT FOREARM Performed at [place], [place] [address] [place], [place] [address]    Special Requests   Final    BOTTLES DRAWN AEROBIC AND ANAEROBIC Blood Culture adequate volume Performed at [place] [address] [place],  [place] [address]    Culture   Final    NO GROWTH < 24 HOURS Performed at [facility], [place] [address] [place], [place] [address]    Report Status PENDING  Incomplete  Resp panel by RT-PCR (RSV, Flu A&B, Covid) Anterior Nasal Swab     Status: None   Collection Time: 02/28/22  3:59 PM   Specimen: Anterior Nasal Swab  Result Value Ref Range Status   SARS Coronavirus 2 by RT PCR NEGATIVE NEGATIVE Final    Comment: (NOTE) SARS-CoV-2 target nucleic acids are NOT DETECTED.  The SARS-CoV-2 RNA is generally detectable in upper respiratory specimens during the acute phase of infection. The lowest concentration of SARS-CoV-2 viral copies this assay can detect is 138 copies/mL. A negative result does not preclude SARS-Cov-2 infection and should not be used as the sole basis for treatment or other patient management decisions. A negative result may occur with  improper specimen collection/handling, submission of specimen other than nasopharyngeal swab, presence of viral mutation(s) within the areas targeted by this assay, and inadequate number of viral copies(<138 copies/mL). A negative result must be combined with clinical observations, patient history, and epidemiological information. The expected result is Negative.  Fact Sheet for Patients:  EntrepreneurPulse.com.au  Fact Sheet for Healthcare Providers:  IncredibleEmployment.be  This test is no t yet approved or cleared by the [place] FDA and  has been authorized for detection and/or diagnosis of SARS-CoV-2 by FDA under an Emergency Use Authorization (EUA). This EUA will remain  in effect (meaning this test can be used) for the duration of the COVID-19 declaration under Section  564(b)(1) of the Act, 21 U.S.C.section 360bbb-3(b)(1), unless the authorization is terminated  or revoked sooner.       Influenza A by PCR NEGATIVE NEGATIVE Final   Influenza B by PCR NEGATIVE NEGATIVE Final    Comment: (NOTE) The Xpert Xpress SARS-CoV-2/FLU/RSV plus assay is intended as an aid in the diagnosis of influenza from Nasopharyngeal swab specimens and should not be used as a sole basis for treatment. Nasal washings and aspirates are unacceptable for Xpert Xpress SARS-CoV-2/FLU/RSV testing.  Fact Sheet for Patients: EntrepreneurPulse.com.au  Fact Sheet for Healthcare Providers: IncredibleEmployment.be  This test is not yet approved or cleared by the [place] FDA and has been authorized for detection and/or diagnosis of SARS-CoV-2 by FDA under an Emergency Use Authorization (EUA). This EUA will remain in effect (meaning this test can be used) for the duration of the COVID-19 declaration under Section 564(b)(1) of the Act, 21 U.S.C. section 360bbb-3(b)(1), unless the authorization is terminated or revoked.     Resp Syncytial Virus by PCR NEGATIVE NEGATIVE Final    Comment: (NOTE) Fact Sheet for Patients: EntrepreneurPulse.com.au  Fact Sheet for Healthcare Providers: IncredibleEmployment.be  This test is not yet approved or cleared by the [place] FDA and has been authorized for detection and/or diagnosis of SARS-CoV-2 by FDA under an Emergency Use Authorization (EUA). This EUA will remain in effect (meaning this test can be used) for the duration of the COVID-19 declaration under Section 564(b)(1) of the Act, 21 U.S.C. section 360bbb-3(b)(1), unless the authorization is terminated or revoked.  Performed at [facility], [place] [address], [place], [place] [address]   Respiratory (~20 pathogens) panel by PCR     Status: None   Collection Time: 02/28/22  5:57 PM    Specimen: Nasopharyngeal Swab; Respiratory  Result Value Ref Range Status   Adenovirus NOT DETECTED NOT DETECTED Final  Coronavirus 229E NOT DETECTED NOT DETECTED Final    Comment: (NOTE) The Coronavirus on the Respiratory Panel, DOES NOT test for the novel  Coronavirus (2019 nCoV)    Coronavirus HKU1 NOT DETECTED NOT DETECTED Final   Coronavirus NL63 NOT DETECTED NOT DETECTED Final   Coronavirus OC43 NOT DETECTED NOT DETECTED Final   Metapneumovirus NOT DETECTED NOT DETECTED Final   Rhinovirus / Enterovirus NOT DETECTED NOT DETECTED Final   Influenza A NOT DETECTED NOT DETECTED Final   Influenza B NOT DETECTED NOT DETECTED Final   Parainfluenza Virus 1 NOT DETECTED NOT DETECTED Final   Parainfluenza Virus 2 NOT DETECTED NOT DETECTED Final   Parainfluenza Virus 3 NOT DETECTED NOT DETECTED Final   Parainfluenza Virus 4 NOT DETECTED NOT DETECTED Final   Respiratory Syncytial Virus NOT DETECTED NOT DETECTED Final   Bordetella pertussis NOT DETECTED NOT DETECTED Final   Bordetella Parapertussis NOT DETECTED NOT DETECTED Final   Chlamydophila pneumoniae NOT DETECTED NOT DETECTED Final   Mycoplasma pneumoniae NOT DETECTED NOT DETECTED Final    Comment: Performed at [facility], [place] [address] [place], [place] [address]     Labs:  CBC: Recent Labs  Lab 02/28/22 1335 02/28/22 1503 03/01/22 0823  WBC 14.8*  --  8.5  NEUTROABS 9.7*  --   --   HGB 12.7* 12.9* 11.6*  HCT 39.5 38.0* 36.6*  MCV 74.0*  --  74.1*  PLT 292  --  215   BMP &GFR Recent Labs  Lab 02/28/22 1335 02/28/22 1446 02/28/22 1503 03/01/22 0823  NA 138  --  137 136  K 4.0  --  3.9 3.9  CL 106  --  102 103  CO2 22  --   --  24  GLUCOSE 91  --  84 243*  BUN 26*  --  23 15  CREATININE 1.17  --  1.20 0.96  CALCIUM 8.8*  --   --  9.2  MG  --  1.7  --  1.8  PHOS  --   --   --  3.2   Estimated Creatinine Clearance: 91.5 mL/min (by C-G formula based on SCr of 0.96 mg/dL). Liver & Pancreas: Recent Labs   Lab 02/28/22 1335 03/01/22 0823  AST 42*  --   ALT 39  --   ALKPHOS 62  --   BILITOT 0.5  --   PROT 6.7  --   ALBUMIN 3.6 3.6   No results for input(s): "LIPASE", "AMYLASE" in the last 168 hours. Recent Labs  Lab 02/28/22 1933  AMMONIA 23   Diabetic: No results for input(s): "HGBA1C" in the last 72 hours. Recent Labs  Lab 02/28/22 1409  GLUCAP 95   Cardiac Enzymes: Recent Labs  Lab 02/28/22 1640  CKTOTAL 157   No results for input(s): "PROBNP" in the last 8760 hours. Coagulation Profile: Recent Labs  Lab 02/28/22 1335  INR 1.1   Thyroid Function Tests: Recent Labs    02/28/22 1450  TSH 2.179   Lipid Profile: No results for input(s): "CHOL", "HDL", "LDLCALC", "TRIG", "CHOLHDL", "LDLDIRECT" in the last 72 hours. Anemia Panel: No results for input(s): "VITAMINB12", "FOLATE", "FERRITIN", "TIBC", "IRON", "RETICCTPCT" in the last 72 hours. Urine analysis:    Component Value Date/Time   COLORURINE YELLOW 02/28/2022 [place] 02/28/2022 1343   LABSPEC 1.020 02/28/2022 1343   PHURINE 5.0 02/28/2022 1343   GLUCOSEU NEGATIVE 02/28/2022 1343   HGBUR NEGATIVE 02/28/2022 1343   BILIRUBINUR NEGATIVE 02/28/2022  [place] 02/28/2022 [place] 02/28/2022 1343   UROBILINOGEN 0.2 01/13/2014 2141   NITRITE NEGATIVE 02/28/2022 1343   LEUKOCYTESUR NEGATIVE 02/28/2022 1343   Sepsis Labs: Invalid input(s): "PROCALCITONIN", "LACTICIDVEN"   SIGNED:  Mercy Riding, MD  [facility] 03/01/2022, 2:29 PM

## 2022-03-01 NOTE — Evaluation (Signed)
Occupational Therapy Evaluation Patient Details Name: Michael Horn MRN: [id number] DOB: 04-Sep-1950 Today's Date: 03/01/2022   History of Present Illness Michael Horn is a 71 y.o. male with PMH of COPD, CAD, tobacco use disorder, IDDM-2, OSA on CPAP, osteoarthritis, BPH, bladder cancer, morbid obesity and chronic pain on opiates presenting with altered mental status, neck pain, dizziness and transient blurry vision.   Clinical Impression   Michael Horn is a 71 year old man. On evaluation he reports he is at his baseline with no new deficits. He is up in the chair on room air. He demonstrated ability to perform sit to stands, ambulated to bathroom and in the hallway. His o2 sat maintained above 93%. He demonstrated good balance and ability to perform baseline ADLs. He has assistance for socks and wears slip on shoes. He typically stands in the shower. Patient has no focal neurological deficits and able to ambulate in hall approximately 200 feet without dyspnea or desaturation. Patient reports no therapy needs.       Recommendations for follow up therapy are one component of a multi-disciplinary discharge planning process, led by the attending physician.  Recommendations may be updated based on patient status, additional functional criteria and insurance authorization.   Follow Up Recommendations  No OT follow up     Assistance Recommended at Discharge None  Patient can return home with the following A little help with bathing/dressing/bathroom    Functional Status Assessment  Patient has not had a recent decline in their functional status  Equipment Recommendations  None recommended by OT    Recommendations for Other Services       Precautions / Restrictions Precautions Precautions: None Restrictions Weight Bearing Restrictions: No      Mobility Bed Mobility               General bed mobility comments: up in chair    Transfers Overall transfer level:  Independent                 General transfer comment: ambulated in hall with IV pole. No loss of balance. No difficulty. o2 sat at 93 and above.      Balance Overall balance assessment: No apparent balance deficits (not formally assessed)                                         ADL either performed or assessed with clinical judgement   ADL Overall ADL's : At baseline                                       General ADL Comments: Needs assistance to don socks and uses slip on shoes - at his baseline.     Vision Patient Visual Report: No change from baseline Vision Assessment?: No apparent visual deficits     Perception     Praxis      Pertinent Vitals/Pain Pain Assessment Pain Assessment: No/denies pain     Hand Dominance Right   Extremity/Trunk Assessment Upper Extremity Assessment Upper Extremity Assessment: RUE deficits/detail;LUE deficits/detail RUE Deficits / Details: WNL ROM, 5/5 strength RUE Sensation: WNL RUE Coordination: WNL LUE Deficits / Details: WFL ROM, 4+/5 shoulder strength otherwise 5/5 strength, history of left shoulder replacement LUE Sensation: WNL LUE Coordination: WNL   Lower Extremity Assessment  Lower Extremity Assessment: Overall WFL for tasks assessed   Cervical / Trunk Assessment Cervical / Trunk Assessment: Normal   Communication Communication Communication: No difficulties   Cognition Arousal/Alertness: Awake/alert Behavior During Therapy: WFL for tasks assessed/performed Overall Cognitive Status: Within Functional Limits for tasks assessed                                       General Comments       Exercises     Shoulder Instructions      Home Living Family/patient expects to be discharged to:: Private residence Living Arrangements: Spouse/significant other Available Help at Discharge: Family;Available 24 hours/day Type of Home: Mobile home Home Access: Stairs to  enter Entrance Stairs-Number of Steps: 3 Entrance Stairs-Rails: Can reach both Home Layout: One level     Bathroom Shower/Tub: Occupational psychologist: Handicapped height     Home Equipment: Grab bars - tub/shower;Shower seat - built Medical sales representative (2 wheels);Cane - single point          Prior Functioning/Environment Prior Level of Function : Independent/Modified Independent                        OT Problem List:        OT Treatment/Interventions:      OT Goals(Current goals can be found in the care plan section) Acute Rehab OT Goals OT Goal Formulation: All assessment and education complete, DC therapy  OT Frequency:      Co-evaluation              AM-PAC OT "6 Clicks" Daily Activity     Outcome Measure Help from another person eating meals?: None Help from another person taking care of personal grooming?: None Help from another person toileting, which includes using toliet, bedpan, or urinal?: None Help from another person bathing (including washing, rinsing, drying)?: None Help from another person to put on and taking off regular upper body clothing?: None Help from another person to put on and taking off regular lower body clothing?: None 6 Click Score: 24   End of Session Nurse Communication: Mobility status  Activity Tolerance: Patient tolerated treatment well Patient left: in chair;with call bell/phone within reach;with family/visitor present  OT Visit Diagnosis: Muscle weakness (generalized) (M62.81)                Time: 6803-2122 OT Time Calculation (min): 12 min Charges:  OT General Charges $OT Visit: 1 Visit OT Evaluation $OT Eval Low Complexity: 1 Low  Gustavo Lah, OTR/L [place]  Office [phone]   Lenward Chancellor 03/01/2022, 9:03 AM

## 2022-03-05 LAB — CULTURE, BLOOD (ROUTINE X 2)
Culture: NO GROWTH
Culture: NO GROWTH
Special Requests: ADEQUATE
Special Requests: ADEQUATE

## 2022-03-07 NOTE — Progress Notes (Signed)
Cardiology Office Note:    Date:  03/08/2022   ID:  DONTAVIAN Horn, DOB September 29, 1950, MRN [id number]  PCP:  Mayra Neer, MD   [facility] Providers Cardiologist:  Larae Grooms, MD     Referring MD: Mayra Neer, MD   Chief Complaint: hospital follow-up  History of Present Illness:    Michael Horn is a pleasant 71 y.o. male with a hx of COPD, type 2 diabetes, HTN, HLD, tobacco use disorder, OSA on CPAP, bladder cancer, morbid obesity, and chronic pain on opiates.   History of cardiac catheterization 2009 revealed no significant coronary artery disease. Last seen in cardiology clinic 06/2013.  Referred back to our office by PCP for evaluation of chest pain and seen  08/22/2021 by Dr. Irish Lack. He reported occasional right-sided chest pain that occurs randomly, no triggers. Occurs once every few months and last for a few minutes.  Improves with massaging the area.  Reported symptoms present for years, no recent increase in intensity.  Discussion of coronary CTA occurred, however he felt comfortable waiting to see if symptoms worsened. Secondary prevention emphasized and he was advised to follow-up as needed.   Admission 12/21-12/22/23 for altered mental status, neck pain, dizziness, and transient blurry vision.  He had sharp neck pain radiating to his left upper back, dizziness, and blurry vision that occurred on the day before and the day of admission. He was recently diagnosed with pneumonia and treated with antibiotic.  Reported productive cough but no chest pain or worsening of chronic dyspnea with exertion.  Continues to smoke 2 packs/day. Upon arrival, BP significantly elevated at 168/125 but improved to 120s over 70s. He was afebrile. CMP without significant finding other than mildly elevated AST at 42.  WBC 14.8 with left shift.  Respiratory panel negative for COVID-19, flu, RSV. CT and CXR without acute finding. EKG revealed atrial fibrillation with RVR at HR 120 bpm.  No previous history of A-fib. Episode was brief and he converted to sinus rhythm on his own.  Anticoagulation was not started. Felt to be exacerbated by non-compliance with CPAP and respiratory infection. Advised to follow-up with OP cardiology.   Today, he is here with his wife for follow-up. Feeling pretty well since hospital discharge. Feels like shortness of breath is worse over the past 6-8 months and that his legs and ankles are swelling more. Notes this mostly when walking back to the house from his barn because it is up an incline. No orthopnea or PND. Sleeps in a recliner with CPAP. Sleeps about 5 hours without any problems. Has not had any episodes of palpitations or feeling his heart race.Was not aware of a fib at time of hospital admission, just knew that he did not feel "right." Has bladder cancer surgery scheduled for 03/21/22 and wife thinks they will request cardiac clearance. He denies chest pain, fatigue, palpitations, diaphoresis, weakness, presyncope, syncope. He continues to smoke 2 ppd.   Past Medical History:  Diagnosis Date   Arthritis    Arthritis -DDD."chronic pain med used"   Bladder tumor    states that it was cancer   Cancer ([place])    Bladder cancer"-Dr.Ottelin- checks every 3 months--no signs last checks.   Chronic knee pain RIGHT KNEE --  S/P KNEE REPLACEMENT JAN 2012--  PT STATES  NEEDS ANOTHER REPLACEMENT DUE TO JOINT RECALL   Complication of anesthesia EMERGENCE DELIRIUM   occurred x1- 2 yrs ago.few surgeries ago, none recently   COPD (chronic obstructive pulmonary disease) (  [place])    Coronary artery disease    Depression    DM type 2 (diabetes mellitus, type 2) (HCC)    GERD (gastroesophageal reflux disease)    H/O hiatal hernia    Headache    History of bladder cancer TCC OF BLADDER  --  FOLLOWED BY DR Karsten Ro   History of peptic ulcer AGE 64   Hypertension    pt denies, takes lisimopril for kidneys   Mild obstructive sleep apnea    per study 09-26-2007--   no cpap rx   Nocturia    Peripheral neuropathy BOTTOM RIGHT FOOT   Seasonal allergies    Short of breath on exertion    Urgency of urination     Past Surgical History:  Procedure Laterality Date   BACK SURGERY  X2   lower back   BILATERAL KNEE ARTHROSCOPY     BILATERAL SHOULDER SURG.  2003   CARDIAC CATHETERIZATION  04-13-2007   ---- DR Irish Lack   NO SIGNIFICANT CAD/ NORMAL LVF   CARPAL TUNNEL RELEASE  04/11/2006   RIGHT   COLONOSCOPY WITH PROPOFOL N/A 05/01/2015   Procedure: COLONOSCOPY WITH PROPOFOL;  Surgeon: Garlan Fair, MD;  Location: [facility];  Service: Endoscopy;  Laterality: N/A;   CYSTOSCOPY W/ RETROGRADES Bilateral 12/23/2019   Procedure: CYSTOSCOPY WITH RETROGRADE PYELOGRAM;  Surgeon: Franchot Gallo, MD;  [facility];  Service: Urology;  Laterality: Bilateral;   HAND SURGERY Right    cyst removed   LEFT CARPAL. TUNNEL RELEASE     LEFT EYE SURG.   2008   REMOVAL OF BB   REPAIR RECURRENT LEFT ROTATOR CUFF TEAR  01/02/2011   REVERSE SHOULDER ARTHROPLASTY Left 04/08/2018   Procedure: REVERSE SHOULDER ARTHROPLASTY;  Surgeon: Nicholes Stairs, MD;  Location: [facility];  Service: Orthopedics;  Laterality: Left;  2.5 hours   SIGMOID COLECTOMY  09/29/2001   DIVERTICULITIS   TOTAL KNEE ARTHROPLASTY  04/03/2010   RIGHT   TRANSURETHRAL RESECTION OF BLADDER TUMOR  07/06/2010   TRANSURETHRAL RESECTION OF BLADDER TUMOR  05/20/2011   Procedure: TRANSURETHRAL RESECTION OF BLADDER TUMOR (TURBT);  Surgeon: Claybon Jabs, MD;  Location: [facility];  Service: Urology;  Laterality: N/A;  GYRUS INSTILL MYTOMYCIN C NO BED   TRANSURETHRAL RESECTION OF BLADDER TUMOR  01/31/2012   Procedure: TRANSURETHRAL RESECTION OF BLADDER TUMOR (TURBT);  Surgeon: Claybon Jabs, MD;  Location: [facility];  Service: Urology;  Laterality: N/A;   TRANSURETHRAL RESECTION OF BLADDER TUMOR WITH GYRUS (TURBT-GYRUS) N/A 11/22/2013    Procedure: Bladder biopsy with fulgeration;  Surgeon: Claybon Jabs, MD;  Location: [facility];  Service: Urology;  Laterality: N/A;   TRANSURETHRAL RESECTION OF BLADDER TUMOR WITH MITOMYCIN-C N/A 12/23/2019   Procedure: TRANSURETHRAL RESECTION OF BLADDER TUMOR WITH POST-OP  GEMCITABINE;  Surgeon: Franchot Gallo, MD;  Location[facility];  Service: Urology;  Laterality: N/A;  45 MINS    Current Medications: Current Meds  Medication Sig   albuterol (VENTOLIN HFA) 108 (90 Base) MCG/ACT inhaler Inhale 2 puffs into the lungs every 4 (four) hours as needed for wheezing or shortness of breath.   aspirin EC 81 MG tablet Take 81 mg by mouth 4 (four) times a week. Swallow whole.   Budeson-Glycopyrrol-Formoterol (BREZTRI AEROSPHERE) 160-9-4.8 MCG/ACT AERO Inhale 2 puffs into the lungs in the morning and at bedtime.   diclofenac Sodium (VOLTAREN ARTHRITIS PAIN) 1 % GEL Apply 2 g topically 2 (two) times  daily as needed (pain).   docusate sodium (COLACE) 100 MG capsule Take 100 mg by mouth daily as needed for mild constipation.   doxycycline (ADOXA) 100 MG tablet Take 100 mg by mouth 2 (two) times daily. 10 DS   esomeprazole (NEXIUM) 40 MG capsule TAKE 1 CAPSULE BY MOUTH ONCE DAILY (APPOINTMENT  NEEDED  FOR  FURTHER  REFILLS) (Patient taking differently: Take 40 mg by mouth daily.)   ezetimibe (ZETIA) 10 MG tablet Take 10 mg by mouth at bedtime.   hyoscyamine (LEVSIN) 0.125 MG tablet Take 0.125 mg by mouth every 8 (eight) hours as needed for cramping.   insulin aspart protamine- aspart (NOVOLOG MIX 70/30) (70-30) 100 UNIT/ML injection Inject 55 Units into the skin 2 (two) times daily.   lisinopril (PRINIVIL,ZESTRIL) 10 MG tablet Take 10 mg by mouth every morning.   metFORMIN (GLUCOPHAGE) 1000 MG tablet Take 1,000 mg by mouth 2 (two) times daily.   Oxycodone HCl 10 MG TABS Take 10 mg by mouth every 6 (six) hours as needed (pain).   pravastatin (PRAVACHOL) 40 MG tablet  Take 40 mg by mouth daily.   pregabalin (LYRICA) 150 MG capsule Take 150 mg by mouth 2 (two) times daily.   tamsulosin (FLOMAX) 0.4 MG CAPS capsule Take 0.4 mg by mouth every morning.   traZODone (DESYREL) 100 MG tablet Take 100 mg by mouth at bedtime.   trospium (SANCTURA) 20 MG tablet Take 20 mg by mouth 2 (two) times daily.     Allergies:   Aripiprazole, Contrast media [iodinated contrast media], Dulaglutide, and Rosuvastatin   Social History   Socioeconomic History   Marital status: Married    Spouse name: Alisa   Number of children: 4   Years of education: Not on file   Highest education level: Not on file  Occupational History   Occupation: retired    Comment: [place] [employer]   Smoking status: Every Day    Packs/day: 2.00    Years: 60.00    Total pack years: 120.00    Types: Cigarettes   Smokeless tobacco: Never   Tobacco comments:    Smoking a little over a pack per day.  Trying to quit.  12/20/21 hfb  Vaping Use   Vaping Use: Never used  Substance and Sexual Activity   Alcohol use: No    Alcohol/week: 0.0 standard drinks of alcohol   Drug use: No   Sexual activity: Not on file  Other Topics Concern   Not on file  Social History Narrative   Not on file   Social Determinants of Health   Financial Resource Strain: Not on file  Food Insecurity: No Food Insecurity (02/28/2022)   Hunger Vital Sign    Worried About Running Out of Food in the Last Year: Never true    Ran Out of Food in the Last Year: Never true  Transportation Needs: No Transportation Needs (02/28/2022)   PRAPARE - Hydrologist (Medical): No    Lack of Transportation (Non-Medical): No  Physical Activity: Not on file  Stress: Not on file  Social Connections: Not on file     Family History: The patient's family history includes Heart attack in his mother; Lung cancer in his nephew. There is no history of Colon cancer, Rectal cancer, or Stomach  cancer.  ROS:   Please see the history of present illness.    + DOE + bilateral leg edema All other systems reviewed and are negative.  Labs/Other Studies Reviewed:    The following studies were reviewed today:  Echo 06/20/21 1. Left ventricular ejection fraction, by estimation, is 60 to 65%. Left  ventricular ejection fraction by 3D volume is 61 %. The left ventricle has  normal function. The left ventricle has no regional wall motion  abnormalities. There is mild concentric  left ventricular hypertrophy. Left ventricular diastolic parameters are  consistent with Grade I diastolic dysfunction (impaired relaxation).   2. Right ventricular systolic function is normal. The right ventricular  size is mildly enlarged. Tricuspid regurgitation signal is inadequate for  assessing PA pressure.   3. The mitral valve is normal in structure. No evidence of mitral valve  regurgitation. No evidence of mitral stenosis.   4. The aortic valve is tricuspid. Aortic valve regurgitation is not  visualized. Aortic valve sclerosis is present, with no evidence of aortic  valve stenosis.   5. The inferior vena cava is dilated in size with <50% respiratory  variability, suggesting right atrial pressure of 15 mmHg.   Comparison(s): No prior Echocardiogram.    Cardiac catheterization 04/27/2007  Cardiac cath was done in 2009 and did not show any blockages: "No significant coronary artery disease.  The patient is likely      having noncardiac chest pain.  2. Normal ventricular function.  3. No abdominal aortic aneurysm.  4. Normal hemodynamics.  5. No renal artery stenosis"    Recent Labs: 02/28/2022: ALT 39; TSH 2.179 03/01/2022: BUN 15; Creatinine, Ser 0.96; Hemoglobin 11.6; Magnesium 1.8; Platelets 215; Potassium 3.9; Sodium 136  Recent Lipid Panel    Risk Assessment/Calculations:    CHA2DS2-VASc Score = 4  This indicates a 4.8% annual risk of stroke. The patient's score is based  upon: CHF History: 1 HTN History: 1 Diabetes History: 1 Stroke History: 0 Vascular Disease History: 0 Age Score: 1 Gender Score: 0    Physical Exam:    VS:  BP (!) 110/58   Pulse 94   Wt 274 lb (124.3 kg)   SpO2 95%   BMI 39.31 kg/m     Wt Readings from Last 3 Encounters:  03/08/22 274 lb (124.3 kg)  02/28/22 271 lb 2.7 oz (123 kg)  12/20/21 270 lb 3.2 oz (122.6 kg)     GEN: Obese, well developed in no acute distress HEENT: Normal NECK: No JVD; No carotid bruits CARDIAC: RRR, no murmurs, rubs, gallops RESPIRATORY:  Diminished breath sounds bilaterally without rales, wheezing or rhonchi  ABDOMEN: Soft, non-tender, non-distended MUSCULOSKELETAL:  Bilateral LE mild non-pitting edema; No deformity. 2+ pedal pulses, equal bilaterally SKIN: Warm and dry NEUROLOGIC:  Alert and oriented x 3 PSYCHIATRIC:  Normal affect   EKG:  EKG is not ordered today.     Diagnoses:    1. Preoperative cardiovascular examination   2. DOE (dyspnea on exertion)   3. Chronic obstructive pulmonary disease, unspecified COPD type ([place])   4. Morbid obesity ([place])   5. Atrial fibrillation with RVR ([place])   6. Tobacco use disorder    Assessment and Plan:     A-fib with RVR: Brief episode of A-fib with RVR during hospitalization 02/27/2022. Clinically appears to be in sinus rhythm today. HR is 94 bpm. No symptoms of palpitations, racing pulse since hospital discharge. Wife confirms that he had one short episode during hospitalization with no additional episodes noted. We discussed the risks of stroke associated with A-fib. He does not want to start anticoagulation at this time. Would like to get a 30 day  monitor, however he has surgery in less than 2 weeks. Do not think 7 day Zio patch would be as beneficial. Through shared decision making, we will postpone onset of monitor until the day after bladder surgery.  Advised him to notify us prior to that time if any episodes of palpitations, tachycardia, or  other concerns.  DOE/COPD: History of COPD which is followed by pulmonology. Reports increased DOE over the past 6 to 8 months. Denies chest pain.  Has mild nonpitting bilateral LE edema, no orthopnea or PND. Body habitus makes it difficult to assess for volume overload. Echo 06/2021 with normal LVEF, G1 DD, no significant valve disease. Will get lexiscan myoview to evaluate for ischemia as part of his preoperative cardiac evaluation. Consider repeat echocardiogram if DOE persists.  Consider diuretic therapy, however BP is soft and he has bladder surgery pending. Will hold off for now.   Preoperative cardiac evaluation: Has upcoming surgery for bladder cancer 03/21/2022. We have not received official clearance, but wife thinks it will be requested once surgeon realizes he was hospitalized. As noted above, will postpone cardiac monitor until after surgery so that full 30 days can be obtained. Do not think a 7 day monitor would be as beneficial. Will get lexiscan myoview for evaluation of ischemia and since he cannot clearly achieve > 4 METS activity without symptoms of DOE.   Hypertension: BP is well controlled. Reports it has never been high, he has taken lisinopril to protect his kidneys.   Tobacco abuse: No interest in quitting. Complete cessation advised.   Shared Decision Making/Informed Consent The risks [chest pain, shortness of breath, cardiac arrhythmias, dizziness, blood pressure fluctuations, myocardial infarction, stroke/transient ischemic attack, nausea, vomiting, allergic reaction, radiation exposure, metallic taste sensation and life-threatening complications (estimated to be 1 in 10,000)], benefits (risk stratification, diagnosing coronary artery disease, treatment guidance) and alternatives of a nuclear stress test were discussed in detail with Mr. Koloski and he agrees to proceed.   Disposition: 2-3 months with Dr. Irish Lack or APP  Medication Adjustments/Labs and Tests Ordered: Current  medicines are reviewed at length with the patient today.  Concerns regarding medicines are outlined above.  Orders Placed This Encounter  Procedures   Cardiac Stress Test: Informed Consent Details: Physician/Practitioner Attestation; Transcribe to consent form and obtain patient signature   Myocardial Perfusion Imaging   No orders of the defined types were placed in this encounter.   Patient Instructions  Medication Instructions:   Your physician recommends that you continue on your current medications as directed. Please refer to the Current Medication list given to you today.   *If you need a refill on your cardiac medications before your next appointment, please call your pharmacy*   Lab Work:  None ordered.  If you have labs (blood work) drawn today and your tests are completely normal, you will receive your results only by: [place] (if you have MyChart) OR A paper copy in the mail If you have any lab test that is abnormal or we need to change your treatment, we will call you to review the results.   Testing/Procedures:  You are scheduled for a Myocardial Perfusion Imaging Study on Tuesday, January 2  at 10:00 am.   Please arrive 15 minutes prior to your appointment time for registration and insurance purposes.   The test will take approximately 3 to 4 hours to complete; you may bring reading material. If someone comes with you to your appointment, they will need to remain in the  main lobby due to limited space in the testing area.   How to prepare for your Myocardial Perfusion test:   Do not eat or drink 3 hours prior to your test, except you may have water.    Do not consume products containing caffeine (regular or decaffeinated) 12 hours prior to your test (ex: coffee, chocolate, soda, tea)   Do bring a list of your current medications with you. If not listed below, you may take your medications as normal.   Bring any held medication to your appointment, as  you may be required to take it once the test is complete.   Do wear comfortable clothes (no overalls) and walking shoes. Tennis shoes are preferred. No open toed shoes.  Do not wear cologne, aftershave or lotions (deodorant is allowed).   If these instructions are not followed, you test will have to be rescheduled.   Please report to [address] for your test. If you have questions or concerns about your appointment, please call the Nuclear Lab at [phone].  If you cannot keep your appointment, please provide 24 hour notification to the Nuclear lab to avoid a possible $50 charge to your account.    [employer] Cardiac Event Monitor Instructions Your physician has requested you wear your cardiac event monitor for __30___ days, (1-30). [employer] may call or text to confirm a shipping address. The monitor will be sent to a land address via UPS. [employer] will not ship a monitor to a PO BOX. It typically takes 3-5 days to receive your monitor after it has been enrolled. [employer] will assist with USPS tracking if your package is delayed. The telephone number for [employer] is [phone]. Once you have received your monitor, please review the enclosed instructions. Instruction tutorials can also be viewed under help and settings on the enclosed cell phone. Your monitor has already been registered assigning a specific monitor serial # to you.  Applying the monitor Remove cell phone from case and turn it on. The cell phone works as Dealer and needs to be within [employer] of you at all times. The cell phone will need to be charged on a daily basis. We recommend you plug the cell phone into the enclosed charger at your bedside table every night.  Monitor batteries: You will receive two monitor batteries labelled #1 and #2. These are your recorders. Plug battery #2 onto the second connection on the enclosed charger. Keep one battery on the charger at all  times. This will keep the monitor battery deactivated. It will also keep it fully charged for when you need to switch your monitor batteries. A small light will be blinking on the battery emblem when it is charging. The light on the battery emblem will remain on when the battery is fully charged.  Open package of a Monitor strip. Insert battery #1 into black hood on strip and gently squeeze monitor battery onto connection as indicated in instruction booklet. Set aside while preparing skin.  Choose location for your strip, vertical or horizontal, as indicated in the instruction booklet. Shave to remove all hair from location. There cannot be any lotions, oils, powders, or colognes on skin where monitor is to be applied. Wipe skin clean with enclosed Saline wipe. Dry skin completely.  Peel paper labeled #1 off the back of the Monitor strip exposing the adhesive. Place the monitor on the chest in the vertical or horizontal position shown in the instruction booklet. One arrow on the  monitor strip must be pointing upward. Carefully remove paper labeled #2, attaching remainder of strip to your skin. Try not to create any folds or wrinkles in the strip as you apply it.  Firmly press and release the circle in the center of the monitor battery. You will hear a small beep. This is turning the monitor battery on. The heart emblem on the monitor battery will light up every 5 seconds if the monitor battery in turned on and connected to the patient securely. Do not push and hold the circle down as this turns the monitor battery off. The cell phone will locate the monitor battery. A screen will appear on the cell phone checking the connection of your monitor strip. This may read poor connection initially but change to good connection within the next minute. Once your monitor accepts the connection you will hear a series of 3 beeps followed by a climbing crescendo of beeps. A screen will appear on the cell  phone showing the two monitor strip placement options. Touch the picture that demonstrates where you applied the monitor strip.  Your monitor strip and battery are waterproof. You are able to shower, bathe, or swim with the monitor on. They just ask you do not submerge deeper than 3 feet underwater. We recommend removing the monitor if you are swimming in a lake, river, or ocean.  Your monitor battery will need to be switched to a fully charged monitor battery approximately once a week. The cell phone will alert you of an action which needs to be made.  On the cell phone, tap for details to reveal connection status, monitor battery status, and cell phone battery status. The green dots indicates your monitor is in good status. A red dot indicates there is something that needs your attention.  To record a symptom, click the circle on the monitor battery. In 30-60 seconds a list of symptoms will appear on the cell phone. Select your symptom and tap save. Your monitor will record a sustained or significant arrhythmia regardless of you clicking the button. Some patients do not feel the heart rhythm irregularities. [employer] will notify us of any serious or critical events.  Refer to instruction booklet for instructions on switching batteries, changing strips, the Do not disturb or Pause features, or any additional questions.  Call [employer] at [phone], to confirm your monitor is transmitting and record your baseline. They will answer any questions you may have regarding the monitor instructions at that time.  Returning the monitor to [place] all equipment back into blue box. Peel off strip of paper to expose adhesive and close box securely. There is a prepaid UPS shipping label on this box. Drop in a UPS drop box, or at a UPS facility like [employer]. You may also contact [employer] to arrange UPS to pick up monitor package at your home.    Follow-Up: At [facility], you and your health needs are our priority.  As part of our continuing mission to provide you with exceptional heart care, we have created designated Provider Care Teams.  These Care Teams include your primary Cardiologist (physician) and Advanced Practice Providers (APPs -  Physician Assistants and Nurse Practitioners) who all work together to provide you with the care you need, when you need it.  We recommend signing up for the patient portal called "MyChart".  Sign up information is provided on this After Visit Summary.  MyChart is used to connect with patients for Virtual Visits (Telemedicine).  Patients are able to view lab/test results, encounter notes, upcoming appointments, etc.  Non-urgent messages can be sent to your provider as well.   To learn more about what you can do with MyChart, go to [email or web address].    Your next appointment:   3 month(s)  The format for your next appointment:   In Person  Provider:   Larae Grooms, MD      Important Information About Sugar         Signed, Emmaline Life, NP  03/08/2022 4:57 PM    [place]

## 2022-03-08 ENCOUNTER — Other Ambulatory Visit: Payer: Self-pay | Admitting: Nurse Practitioner

## 2022-03-08 ENCOUNTER — Ambulatory Visit: Payer: PPO | Attending: Nurse Practitioner | Admitting: Nurse Practitioner

## 2022-03-08 ENCOUNTER — Encounter: Payer: Self-pay | Admitting: *Deleted

## 2022-03-08 ENCOUNTER — Encounter: Payer: Self-pay | Admitting: Nurse Practitioner

## 2022-03-08 VITALS — BP 110/58 | HR 94 | Wt 274.0 lb

## 2022-03-08 DIAGNOSIS — I4891 Unspecified atrial fibrillation: Secondary | ICD-10-CM

## 2022-03-08 DIAGNOSIS — F17200 Nicotine dependence, unspecified, uncomplicated: Secondary | ICD-10-CM

## 2022-03-08 DIAGNOSIS — Z01810 Encounter for preprocedural cardiovascular examination: Secondary | ICD-10-CM | POA: Diagnosis not present

## 2022-03-08 DIAGNOSIS — J449 Chronic obstructive pulmonary disease, unspecified: Secondary | ICD-10-CM

## 2022-03-08 DIAGNOSIS — R0602 Shortness of breath: Secondary | ICD-10-CM

## 2022-03-08 DIAGNOSIS — R0609 Other forms of dyspnea: Secondary | ICD-10-CM | POA: Diagnosis not present

## 2022-03-08 DIAGNOSIS — Z01818 Encounter for other preprocedural examination: Secondary | ICD-10-CM

## 2022-03-08 DIAGNOSIS — R42 Dizziness and giddiness: Secondary | ICD-10-CM

## 2022-03-08 NOTE — Progress Notes (Signed)
Patient enrolled for [employer] to ship a 30 day cardiac event monitor to his address on file.

## 2022-03-08 NOTE — Patient Instructions (Signed)
Medication Instructions:   Your physician recommends that you continue on your current medications as directed. Please refer to the Current Medication list given to you today.   *If you need a refill on your cardiac medications before your next appointment, please call your pharmacy*   Lab Work:  None ordered.  If you have labs (blood work) drawn today and your tests are completely normal, you will receive your results only by: [place] (if you have MyChart) OR A paper copy in the mail If you have any lab test that is abnormal or we need to change your treatment, we will call you to review the results.   Testing/Procedures:  You are scheduled for a Myocardial Perfusion Imaging Study on Tuesday, January 2  at 10:00 am.   Please arrive 15 minutes prior to your appointment time for registration and insurance purposes.   The test will take approximately 3 to 4 hours to complete; you may bring reading material. If someone comes with you to your appointment, they will need to remain in the main lobby due to limited space in the testing area.   How to prepare for your Myocardial Perfusion test:   Do not eat or drink 3 hours prior to your test, except you may have water.    Do not consume products containing caffeine (regular or decaffeinated) 12 hours prior to your test (ex: coffee, chocolate, soda, tea)   Do bring a list of your current medications with you. If not listed below, you may take your medications as normal.   Bring any held medication to your appointment, as you may be required to take it once the test is complete.   Do wear comfortable clothes (no overalls) and walking shoes. Tennis shoes are preferred. No open toed shoes.  Do not wear cologne, aftershave or lotions (deodorant is allowed).   If these instructions are not followed, you test will have to be rescheduled.   Please report to [address] for your test. If you have questions or  concerns about your appointment, please call the Nuclear Lab at [phone].  If you cannot keep your appointment, please provide 24 hour notification to the Nuclear lab to avoid a possible $50 charge to your account.    [employer] Cardiac Event Monitor Instructions Your physician has requested you wear your cardiac event monitor for __30___ days, (1-30). [employer] may call or text to confirm a shipping address. The monitor will be sent to a land address via UPS. [employer] will not ship a monitor to a PO BOX. It typically takes 3-5 days to receive your monitor after it has been enrolled. [employer] will assist with USPS tracking if your package is delayed. The telephone number for [employer] is [phone]. Once you have received your monitor, please review the enclosed instructions. Instruction tutorials can also be viewed under help and settings on the enclosed cell phone. Your monitor has already been registered assigning a specific monitor serial # to you.  Applying the monitor Remove cell phone from case and turn it on. The cell phone works as Dealer and needs to be within [employer] of you at all times. The cell phone will need to be charged on a daily basis. We recommend you plug the cell phone into the enclosed charger at your bedside table every night.  Monitor batteries: You will receive two monitor batteries labelled #1 and #2. These are your recorders. Plug battery #2 onto the second connection on  the enclosed charger. Keep one battery on the charger at all times. This will keep the monitor battery deactivated. It will also keep it fully charged for when you need to switch your monitor batteries. A small light will be blinking on the battery emblem when it is charging. The light on the battery emblem will remain on when the battery is fully charged.  Open package of a Monitor strip. Insert battery #1 into black hood on strip and gently squeeze monitor battery onto  connection as indicated in instruction booklet. Set aside while preparing skin.  Choose location for your strip, vertical or horizontal, as indicated in the instruction booklet. Shave to remove all hair from location. There cannot be any lotions, oils, powders, or colognes on skin where monitor is to be applied. Wipe skin clean with enclosed Saline wipe. Dry skin completely.  Peel paper labeled #1 off the back of the Monitor strip exposing the adhesive. Place the monitor on the chest in the vertical or horizontal position shown in the instruction booklet. One arrow on the monitor strip must be pointing upward. Carefully remove paper labeled #2, attaching remainder of strip to your skin. Try not to create any folds or wrinkles in the strip as you apply it.  Firmly press and release the circle in the center of the monitor battery. You will hear a small beep. This is turning the monitor battery on. The heart emblem on the monitor battery will light up every 5 seconds if the monitor battery in turned on and connected to the patient securely. Do not push and hold the circle down as this turns the monitor battery off. The cell phone will locate the monitor battery. A screen will appear on the cell phone checking the connection of your monitor strip. This may read poor connection initially but change to good connection within the next minute. Once your monitor accepts the connection you will hear a series of 3 beeps followed by a climbing crescendo of beeps. A screen will appear on the cell phone showing the two monitor strip placement options. Touch the picture that demonstrates where you applied the monitor strip.  Your monitor strip and battery are waterproof. You are able to shower, bathe, or swim with the monitor on. They just ask you do not submerge deeper than 3 feet underwater. We recommend removing the monitor if you are swimming in a lake, river, or ocean.  Your monitor battery will need  to be switched to a fully charged monitor battery approximately once a week. The cell phone will alert you of an action which needs to be made.  On the cell phone, tap for details to reveal connection status, monitor battery status, and cell phone battery status. The green dots indicates your monitor is in good status. A red dot indicates there is something that needs your attention.  To record a symptom, click the circle on the monitor battery. In 30-60 seconds a list of symptoms will appear on the cell phone. Select your symptom and tap save. Your monitor will record a sustained or significant arrhythmia regardless of you clicking the button. Some patients do not feel the heart rhythm irregularities. [employer] will notify us of any serious or critical events.  Refer to instruction booklet for instructions on switching batteries, changing strips, the Do not disturb or Pause features, or any additional questions.  Call [employer] at [phone], to confirm your monitor is transmitting and record your baseline. They will answer any questions you may  have regarding the monitor instructions at that time.  Returning the monitor to [place] all equipment back into blue box. Peel off strip of paper to expose adhesive and close box securely. There is a prepaid UPS shipping label on this box. Drop in a UPS drop box, or at a UPS facility like [employer]. You may also contact [employer] to arrange UPS to pick up monitor package at your home.    Follow-Up: At [facility], you and your health needs are our priority.  As part of our continuing mission to provide you with exceptional heart care, we have created designated Provider Care Teams.  These Care Teams include your primary Cardiologist (physician) and Advanced Practice Providers (APPs -  Physician Assistants and Nurse Practitioners) who all work together to provide you with the care you need, when you need it.  We recommend  signing up for the patient portal called "MyChart".  Sign up information is provided on this After Visit Summary.  MyChart is used to connect with patients for Virtual Visits (Telemedicine).  Patients are able to view lab/test results, encounter notes, upcoming appointments, etc.  Non-urgent messages can be sent to your provider as well.   To learn more about what you can do with MyChart, go to [email or web address].    Your next appointment:   3 month(s)  The format for your next appointment:   In Person  Provider:   Larae Grooms, MD      Important Information About Sugar

## 2022-03-12 ENCOUNTER — Ambulatory Visit (HOSPITAL_COMMUNITY): Payer: PPO | Attending: Internal Medicine

## 2022-03-12 DIAGNOSIS — Z01810 Encounter for preprocedural cardiovascular examination: Secondary | ICD-10-CM | POA: Diagnosis not present

## 2022-03-12 DIAGNOSIS — R0609 Other forms of dyspnea: Secondary | ICD-10-CM | POA: Diagnosis not present

## 2022-03-12 DIAGNOSIS — J449 Chronic obstructive pulmonary disease, unspecified: Secondary | ICD-10-CM

## 2022-03-12 LAB — MYOCARDIAL PERFUSION IMAGING
LV dias vol: 84 mL (ref 62–150)
LV sys vol: 31 mL
Nuc Stress EF: 63 %
Peak HR: 91 {beats}/min
Rest HR: 72 {beats}/min
Rest Nuclear Isotope Dose: 10.1 mCi
SDS: 0
SRS: 0
SSS: 0
ST Depression (mm): 0 mm
Stress Nuclear Isotope Dose: 32.1 mCi
TID: 1.14

## 2022-03-12 MED ORDER — TECHNETIUM TC 99M TETROFOSMIN IV KIT
10.1000 | PACK | Freq: Once | INTRAVENOUS | Status: AC | PRN
  Administered 2022-03-12: 10.1 via INTRAVENOUS

## 2022-03-12 MED ORDER — TECHNETIUM TC 99M TETROFOSMIN IV KIT
32.1000 | PACK | Freq: Once | INTRAVENOUS | Status: AC | PRN
  Administered 2022-03-12: 32.1 via INTRAVENOUS

## 2022-03-12 MED ORDER — REGADENOSON 0.4 MG/5ML IV SOLN
0.4000 mg | Freq: Once | INTRAVENOUS | Status: AC
  Administered 2022-03-12: 0.4 mg via INTRAVENOUS

## 2022-03-14 ENCOUNTER — Ambulatory Visit: Payer: PRIVATE HEALTH INSURANCE | Admitting: *Deleted

## 2022-03-14 DIAGNOSIS — M62830 Muscle spasm of back: Secondary | ICD-10-CM | POA: Diagnosis not present

## 2022-03-14 DIAGNOSIS — M25561 Pain in right knee: Secondary | ICD-10-CM | POA: Diagnosis not present

## 2022-03-14 DIAGNOSIS — M47817 Spondylosis without myelopathy or radiculopathy, lumbosacral region: Secondary | ICD-10-CM | POA: Diagnosis not present

## 2022-03-14 DIAGNOSIS — G894 Chronic pain syndrome: Secondary | ICD-10-CM | POA: Diagnosis not present

## 2022-03-14 NOTE — Progress Notes (Signed)
Patient presents to the office today for diabetic shoe and insole measuring.  Patient was measured with brannock device to determine size and width for 1 pair of extra depth shoes and foam casted for 3 pair of insoles.   ABN signed.   Documentation of medical necessity will be sent to patient's treating diabetic doctor to verify and sign.   Patient's diabetic provider: Willodean Rosenthal, MD  Shoes and insoles will be ordered at that time and patient will be notified for an appointment for fitting when they arrive.   Patient shoe selection-   1st   Shoe choice:   [id number] [employer]X   Shoe size ordered: 11 W

## 2022-03-15 ENCOUNTER — Telehealth: Payer: Self-pay | Admitting: Interventional Cardiology

## 2022-03-15 ENCOUNTER — Encounter (HOSPITAL_BASED_OUTPATIENT_CLINIC_OR_DEPARTMENT_OTHER): Payer: Self-pay | Admitting: Urology

## 2022-03-15 NOTE — Progress Notes (Signed)
Reviewed chart for pre-op phone call.  - Admitted inpatient 12/21-12/22/23 for AMS, dizziness, transient blurry vision, during stay EKG revealed atrial fibrillation with RVR at HR 120 bpm. No previous history of A-fib. Anticoagulation not started. Advised to f/u with OP cardiology.  - PMH: COPD, DM 2, CAD, HTN, OSA with CPAP, DOE, 2 PPD x 60 yrs  - f/u OV with cardiology on 03/08/22: EF 60-65%, BLE edema, 30 day cardiac monitor ordered but postponed d/t upcoming urology surgery, per cardiology note nuclear stress test 03/12/22 low risk. He may proceed with surgery without further cardiac testing. His RCRI for MACE is 11%.    - reviewed findings with anesthesiologist, Dr. Gifford Shave; MD states pt is appropriate for [facility] and agrees with cardiology for clearance to proceed with urology procedure as scheduled  Lyndel Pleasure, RN

## 2022-03-15 NOTE — Telephone Encounter (Signed)
   [facility] Pre-operative Risk Assessment    Request for surgical clearance:  What type of surgery is being performed?  Bladder Tumor Removal   When is this surgery scheduled?  03/21/22   What type of clearance is required (medical clearance vs. Pharmacy clearance to hold med vs. Both)?  Both   Are there any medications that need to be held prior to surgery and how long? Aspirin, 5 days prior   Practice name and name of physician performing surgery?  [facility]  Dr. Diona Fanti   What is your office phone number? [phone]   7.   What is your office fax number? [phone]  8.   Anesthesia type (None, local, MAC, general) ?  General   Patient had appointment for pre-op clearance on 03/08/22. Per [place], formal clearance request was still needed at the time of appointment.

## 2022-03-15 NOTE — Progress Notes (Signed)
Spoke w/ via phone for pre-op interview--- patient Lab needs dos----  CXR and EKG per MD, [place]             Lab results------ in Epic COVID test -----patient states asymptomatic no test needed Arrive at ------- 1230 on 03/21/22 NPO after MN NO Solid Food.  Clear liquids from MN until--- 1130 on 03/21/22 Med rec completed Medications to take morning of surgery ----- albuterol and breztri inhalers, nexium, lyrica, flomax, oxycodone prn pain  Diabetic medication ----- take 1/2 dose of novolog 70/30 night prior to surgery (27.5 units), HOLD all diabetic medication morning of surgery Patient instructed no nail polish to be worn day of surgery Patient instructed to bring photo id and insurance card day of surgery Patient aware to have Driver (ride ) / caregiver    for 24 hours after surgery - Michael Horn (wife) [phone] Patient Special Instructions ----- bring inhalers and CPAP DOS Pre-Op special Istructions ----- none Patient verbalized understanding of instructions that were given at this phone interview. Patient denies shortness of breath, chest pain, fever, cough at this phone interview.  See progress note for 03/15/22 for cardiac clearance for surgery. Pt denies chest pain, palpitations, does report + DOE, MD is aware, has 30 day cardiac monitor at home to start using s/p surgery.   Lyndel Pleasure, RN

## 2022-03-15 NOTE — Telephone Encounter (Signed)
   Patient Name: LAYTEN AIKEN  DOB: Nov 01, 1950 MRN: [id number]  Primary Cardiologist: Larae Grooms, MD  Chart reviewed as part of pre-operative protocol coverage.  Mr. Mirabal was seen on 03/08/2022 by Christen Bame, NP and was cleared for his upcoming surgical procedure.  He underwent a Lexiscan Myoview for further restratification that was low risk.  Please see enclosed note regarding encounter.  I will route this recommendation to the requesting party via Epic fax function and remove from pre-op pool.  Regarding ASA therapy it may be stopped 5-7 days prior to surgery with a plan to resume it as soon as felt to be feasible from a surgical standpoint in the post-operative period.   Please call with questions.  Mable Fill, Marissa Nestle, NP 03/15/2022, 12:38 PM

## 2022-03-21 ENCOUNTER — Ambulatory Visit (HOSPITAL_BASED_OUTPATIENT_CLINIC_OR_DEPARTMENT_OTHER): Payer: PPO | Admitting: Anesthesiology

## 2022-03-21 ENCOUNTER — Other Ambulatory Visit: Payer: Self-pay

## 2022-03-21 ENCOUNTER — Ambulatory Visit (HOSPITAL_BASED_OUTPATIENT_CLINIC_OR_DEPARTMENT_OTHER)
Admission: RE | Admit: 2022-03-21 | Discharge: 2022-03-21 | Disposition: A | Discharge status: Home / Self Care | Payer: PPO | Attending: Urology | Admitting: Urology

## 2022-03-21 ENCOUNTER — Encounter (HOSPITAL_BASED_OUTPATIENT_CLINIC_OR_DEPARTMENT_OTHER): Payer: Self-pay | Admitting: Urology

## 2022-03-21 ENCOUNTER — Encounter (HOSPITAL_BASED_OUTPATIENT_CLINIC_OR_DEPARTMENT_OTHER)
Admission: RE | Disposition: A | Discharge status: Home / Self Care | Payer: Self-pay | Source: Home / Self Care | Attending: Urology

## 2022-03-21 DIAGNOSIS — G4733 Obstructive sleep apnea (adult) (pediatric): Secondary | ICD-10-CM | POA: Diagnosis not present

## 2022-03-21 DIAGNOSIS — Z7984 Long term (current) use of oral hypoglycemic drugs: Secondary | ICD-10-CM | POA: Diagnosis not present

## 2022-03-21 DIAGNOSIS — C678 Malignant neoplasm of overlapping sites of bladder: Secondary | ICD-10-CM | POA: Diagnosis not present

## 2022-03-21 DIAGNOSIS — F17200 Nicotine dependence, unspecified, uncomplicated: Secondary | ICD-10-CM | POA: Diagnosis not present

## 2022-03-21 DIAGNOSIS — Z801 Family history of malignant neoplasm of trachea, bronchus and lung: Secondary | ICD-10-CM | POA: Insufficient documentation

## 2022-03-21 DIAGNOSIS — C679 Malignant neoplasm of bladder, unspecified: Secondary | ICD-10-CM

## 2022-03-21 DIAGNOSIS — Z6839 Body mass index (BMI) 39.0-39.9, adult: Secondary | ICD-10-CM | POA: Diagnosis not present

## 2022-03-21 DIAGNOSIS — D090 Carcinoma in situ of bladder: Secondary | ICD-10-CM | POA: Diagnosis not present

## 2022-03-21 DIAGNOSIS — K219 Gastro-esophageal reflux disease without esophagitis: Secondary | ICD-10-CM | POA: Insufficient documentation

## 2022-03-21 DIAGNOSIS — J449 Chronic obstructive pulmonary disease, unspecified: Secondary | ICD-10-CM | POA: Diagnosis not present

## 2022-03-21 DIAGNOSIS — I4891 Unspecified atrial fibrillation: Secondary | ICD-10-CM | POA: Diagnosis not present

## 2022-03-21 DIAGNOSIS — I10 Essential (primary) hypertension: Secondary | ICD-10-CM | POA: Insufficient documentation

## 2022-03-21 DIAGNOSIS — Z794 Long term (current) use of insulin: Secondary | ICD-10-CM | POA: Diagnosis not present

## 2022-03-21 DIAGNOSIS — I2510 Atherosclerotic heart disease of native coronary artery without angina pectoris: Secondary | ICD-10-CM | POA: Insufficient documentation

## 2022-03-21 DIAGNOSIS — E119 Type 2 diabetes mellitus without complications: Secondary | ICD-10-CM

## 2022-03-21 HISTORY — PX: TRANSURETHRAL RESECTION OF BLADDER TUMOR WITH MITOMYCIN-C: SHX6459

## 2022-03-21 LAB — POCT I-STAT, CHEM 8
BUN: 13 mg/dL (ref 8–23)
Calcium, Ion: 1.2 mmol/L (ref 1.15–1.40)
Chloride: 100 mmol/L (ref 98–111)
Creatinine, Ser: 0.9 mg/dL (ref 0.61–1.24)
Glucose, Bld: 182 mg/dL — ABNORMAL HIGH (ref 70–99)
HCT: 35 % — ABNORMAL LOW (ref 39.0–52.0)
Hemoglobin: 11.9 g/dL — ABNORMAL LOW (ref 13.0–17.0)
Potassium: 4.3 mmol/L (ref 3.5–5.1)
Sodium: 136 mmol/L (ref 135–145)
TCO2: 25 mmol/L (ref 22–32)

## 2022-03-21 LAB — GLUCOSE, CAPILLARY: Glucose-Capillary: 165 mg/dL — ABNORMAL HIGH (ref 70–99)

## 2022-03-21 SURGERY — TRANSURETHRAL RESECTION OF BLADDER TUMOR WITH MITOMYCIN-C
Anesthesia: General | Site: Bladder

## 2022-03-21 MED ORDER — DEXAMETHASONE SODIUM PHOSPHATE 10 MG/ML IJ SOLN
INTRAMUSCULAR | Status: DC | PRN
  Administered 2022-03-21: 5 mg via INTRAVENOUS

## 2022-03-21 MED ORDER — FENTANYL CITRATE (PF) 100 MCG/2ML IJ SOLN
INTRAMUSCULAR | Status: AC
  Filled 2022-03-21: qty 2

## 2022-03-21 MED ORDER — CEFAZOLIN SODIUM-DEXTROSE 2-4 GM/100ML-% IV SOLN
INTRAVENOUS | Status: AC
  Filled 2022-03-21: qty 100

## 2022-03-21 MED ORDER — 0.9 % SODIUM CHLORIDE (POUR BTL) OPTIME
TOPICAL | Status: DC | PRN
  Administered 2022-03-21: 1000 mL

## 2022-03-21 MED ORDER — CEFAZOLIN IN SODIUM CHLORIDE 3-0.9 GM/100ML-% IV SOLN
3.0000 g | INTRAVENOUS | Status: AC
  Administered 2022-03-21: 3 g via INTRAVENOUS

## 2022-03-21 MED ORDER — SULFAMETHOXAZOLE-TRIMETHOPRIM 800-160 MG PO TABS: 1.0000 | ORAL_TABLET | Freq: Two times a day (BID) | ORAL | 0 refills | Status: DC

## 2022-03-21 MED ORDER — FENTANYL CITRATE (PF) 100 MCG/2ML IJ SOLN
25.0000 ug | INTRAMUSCULAR | Status: DC | PRN
  Administered 2022-03-21 (×5): 25 ug via INTRAVENOUS

## 2022-03-21 MED ORDER — PHENYLEPHRINE 80 MCG/ML (10ML) SYRINGE FOR IV PUSH (FOR BLOOD PRESSURE SUPPORT)
PREFILLED_SYRINGE | INTRAVENOUS | Status: DC | PRN
  Administered 2022-03-21 (×3): 80 ug via INTRAVENOUS
  Administered 2022-03-21 (×2): 160 ug via INTRAVENOUS

## 2022-03-21 MED ORDER — FENTANYL CITRATE (PF) 100 MCG/2ML IJ SOLN
INTRAMUSCULAR | Status: DC | PRN
  Administered 2022-03-21: 50 ug via INTRAVENOUS
  Administered 2022-03-21 (×2): 25 ug via INTRAVENOUS

## 2022-03-21 MED ORDER — LACTATED RINGERS IV SOLN: INTRAVENOUS | Status: DC

## 2022-03-21 MED ORDER — ACETAMINOPHEN 10 MG/ML IV SOLN: 1000.0000 mg | Freq: Once | INTRAVENOUS | Status: DC | PRN

## 2022-03-21 MED ORDER — PROPOFOL 10 MG/ML IV BOLUS
INTRAVENOUS | Status: AC
  Filled 2022-03-21: qty 20

## 2022-03-21 MED ORDER — CEFAZOLIN SODIUM-DEXTROSE 1-4 GM/50ML-% IV SOLN
INTRAVENOUS | Status: AC
  Filled 2022-03-21: qty 50

## 2022-03-21 MED ORDER — PROPOFOL 10 MG/ML IV BOLUS
INTRAVENOUS | Status: DC | PRN
  Administered 2022-03-21: 200 mg via INTRAVENOUS
  Administered 2022-03-21: 20 mg via INTRAVENOUS

## 2022-03-21 MED ORDER — ONDANSETRON HCL 4 MG/2ML IJ SOLN
INTRAMUSCULAR | Status: AC
  Filled 2022-03-21: qty 2

## 2022-03-21 MED ORDER — ARTIFICIAL TEARS OPHTHALMIC OINT
TOPICAL_OINTMENT | OPHTHALMIC | Status: AC
  Filled 2022-03-21: qty 3.5

## 2022-03-21 MED ORDER — DEXAMETHASONE SODIUM PHOSPHATE 10 MG/ML IJ SOLN
INTRAMUSCULAR | Status: AC
  Filled 2022-03-21: qty 1

## 2022-03-21 MED ORDER — SODIUM CHLORIDE 0.9 % IR SOLN
Status: DC | PRN
  Administered 2022-03-21 (×2): 3000 mL

## 2022-03-21 MED ORDER — OXYBUTYNIN CHLORIDE 5 MG PO TABS: ORAL_TABLET | ORAL | 1 refills | Status: DC

## 2022-03-21 MED ORDER — ONDANSETRON HCL 4 MG/2ML IJ SOLN
INTRAMUSCULAR | Status: DC | PRN
  Administered 2022-03-21: 4 mg via INTRAVENOUS

## 2022-03-21 MED ORDER — LIDOCAINE 2% (20 MG/ML) 5 ML SYRINGE
INTRAMUSCULAR | Status: DC | PRN
  Administered 2022-03-21: 80 mg via INTRAVENOUS

## 2022-03-21 MED ORDER — GEMCITABINE CHEMO FOR BLADDER INSTILLATION 2000 MG
2000.0000 mg | Freq: Once | INTRAVENOUS | Status: AC
  Administered 2022-03-21: 2000 mg via INTRAVESICAL
  Filled 2022-03-21: qty 2000

## 2022-03-21 MED ORDER — LIDOCAINE HCL (PF) 2 % IJ SOLN
INTRAMUSCULAR | Status: AC
  Filled 2022-03-21: qty 5

## 2022-03-21 SURGICAL SUPPLY — 35 items
BAG DRAIN URO-CYSTO SKYTR STRL (DRAIN) ×2 IMPLANT
BAG DRN RND TRDRP ANRFLXCHMBR (UROLOGICAL SUPPLIES)
BAG DRN UROCATH (DRAIN) ×1
BAG URINE DRAIN 2000ML AR STRL (UROLOGICAL SUPPLIES) IMPLANT
BAG URINE LEG 500ML (DRAIN) IMPLANT
CATH FOLEY 2WAY SLVR  5CC 18FR (CATHETERS) ×1
CATH FOLEY 2WAY SLVR  5CC 20FR (CATHETERS)
CATH FOLEY 2WAY SLVR  5CC 22FR (CATHETERS)
CATH FOLEY 2WAY SLVR 5CC 18FR (CATHETERS) IMPLANT
CATH FOLEY 2WAY SLVR 5CC 20FR (CATHETERS) IMPLANT
CATH FOLEY 2WAY SLVR 5CC 22FR (CATHETERS) IMPLANT
CATH FOLEY 3WAY 30CC 22FR (CATHETERS) IMPLANT
CLOTH BEACON ORANGE TIMEOUT ST (SAFETY) ×2 IMPLANT
ELECT REM PT RETURN 9FT ADLT (ELECTROSURGICAL) ×1
ELECTRODE REM PT RTRN 9FT ADLT (ELECTROSURGICAL) ×2 IMPLANT
EVACUATOR MICROVAS BLADDER (UROLOGICAL SUPPLIES) IMPLANT
GLOVE BIO SURGEON STRL SZ8 (GLOVE) ×2 IMPLANT
GLOVE BIOGEL PI IND STRL 6.5 (GLOVE) IMPLANT
GLOVE SURG SS PI 8.0 STRL IVOR (GLOVE) IMPLANT
GOWN STRL REUS W/ TWL LRG LVL3 (GOWN DISPOSABLE) IMPLANT
GOWN STRL REUS W/TWL LRG LVL3 (GOWN DISPOSABLE) ×1 IMPLANT
GOWN STRL REUS W/TWL XL LVL3 (GOWN DISPOSABLE) ×2 IMPLANT
HOLDER FOLEY CATH W/STRAP (MISCELLANEOUS) IMPLANT
IV NS IRRIG 3000ML ARTHROMATIC (IV SOLUTION) ×2 IMPLANT
KIT TURNOVER CYSTO (KITS) ×2 IMPLANT
LOOP CUT BIPOLAR 24F LRG (ELECTROSURGICAL) ×2 IMPLANT
MANIFOLD NEPTUNE II (INSTRUMENTS) ×2 IMPLANT
NS IRRIG 1000ML POUR BTL (IV SOLUTION) IMPLANT
PACK CYSTO (CUSTOM PROCEDURE TRAY) ×2 IMPLANT
PLUG CATH AND CAP STER (CATHETERS) IMPLANT
SYR TOOMEY IRRIG 70ML (MISCELLANEOUS) ×1
SYRINGE TOOMEY IRRIG 70ML (MISCELLANEOUS) ×2 IMPLANT
TUBE CONNECTING 12X1/4 (SUCTIONS) ×2 IMPLANT
TUBING UROLOGY SET (TUBING) IMPLANT
WATER STERILE IRR 500ML POUR (IV SOLUTION) IMPLANT

## 2022-03-21 NOTE — Op Note (Signed)
Preoperative diagnosis: Recurrent bladder cancer  Postoperative diagnosis: Same  Principal procedure: Cystoscopy, transurethral resection of bladder tumors-3 tumors each approximately 1.5 cm in diameter, placement of intravesical gemcitabine  Surgeon: Diona Fanti  Estimated blood loss: Less than 25 cc  Anesthesia: General with LMA  Specimen: Bladder tumor fragments, to pathology  Drains: None  Complications: None  Indications: 72 year old male with longstanding history of urothelial carcinoma.  Recent cystoscopy revealed his sixth recurrence.  He presents at this time for resection.  These recurrences are, from what I could see in the office, 2 papillary lesions at the dome of the bladder each measuring 1.5 cm.  I have discussed the procedure, routine resection, risk, complications and the fact that we plan on putting gemcitabine and postoperatively.  He understands the process, risk, complications and desires to proceed.  Findings: Urethra was normal.  Prostate was mildly to moderately obstructive with trilobar hypertrophy.  Bladder inspected circumferentially.  Ureteral orifices were normal in their location and configuration.  There were actually 3 recurrences-1 in the intra trigonal region just above the interureteric ridge.  This was just at 1 cm, 2 papillary lesions at the bladder dome just to the left of the midline, lying fairly near each other.  No other urothelial lesions were noted.  Description of procedure: Patient properly identified in the holding area.  Taken to the operating room where general anesthetic was administered with the LMA.  He was placed in the dorsolithotomy position.  Genitalia and perineum were prepped, draped, proper timeout performed.  35 French resectoscope sheath passed using the visual obturator.  Above-mentioned findings noted.  At this point I realized that it would be somewhat difficult to reach these bladder dome tumors as the patient was morbidly obese  and they were at the dome.  The cutting loop was then placed and using bipolar energy, the intra trigonal lesion was resected into the muscular layer.  I then turned my attention to the 2 tumors on the dome.  The scope was not long enough to reach the tumors with the bladder filled, as the patient was morbidly obese.  However, with the bladder mostly decompressed and significant suprapubic pressure by me and the surgical assistant, I was able to resect both of these tumors using the loop.  It was very difficult, however.  Once resection of all the lesions was accomplished, I cauterized the base of the lesion/resected area with the cautery/coag current.  Adequate hemostasis noted.  Bladder tumor fragments were then irrigated from the bladder.  Inspection of the resected sites revealed hemostasis.  There was bleeding from the prostatic urethra/prostate as there was a fairly large intravesical median lobe.  However, I felt like this would stop.  I then removed the scope, 77 French Foley catheter was placed, balloon filled with 10 cc of water and hooked to dependent drainage.  At this point the patient was awakened.  He was taken to the PACU in stable condition, having tolerated the procedure well.  In the PACU, 2 g of gemcitabine was administered and left indwelling for 1 hour.

## 2022-03-21 NOTE — Discharge Instructions (Addendum)
You may see some blood in the urine and may have some burning with urination for 48-72 hours. You also may notice that you have to urinate more frequently or urgently after your procedure which is normal.  You should call should you develop an inability urinate, fever > 101, persistent nausea and vomiting that prevents you from eating or drinking to stay hydrated. If you have a catheter, you will be taught how to take care of the catheter by the nursing staff prior to discharge from the hospital.  You may periodically feel a strong urge to void with the catheter in place.  This is a bladder spasm and most often can occur when having a bowel movement or moving around. It is typically self-limited and usually will stop after a few minutes.  You may use some Vaseline or Neosporin around the tip of the catheter to reduce friction at the tip of the penis. You may also see some blood in the urine.  A very small amount of blood can make the urine look quite red.  As long as the catheter is draining well, there usually is not a problem.  However, if the catheter is not draining well and is bloody, you should call the office [phone]) to notify us.  If the urine is clear on Friday morning you may remove the catheter as instructed by the nurses.   Post Anesthesia Home Care Instructions  Activity: Get plenty of rest for the remainder of the day. A responsible individual must stay with you for 24 hours following the procedure.  For the next 24 hours, DO NOT: -Drive a car -Paediatric nurse -Drink alcoholic beverages -Take any medication unless instructed by your physician -Make any legal decisions or sign important papers.  Meals: Start with liquid foods such as gelatin or soup. Progress to regular foods as tolerated. Avoid greasy, spicy, heavy foods. If nausea and/or vomiting occur, drink only clear liquids until the nausea and/or vomiting subsides. Call your physician if vomiting continues.  Special  Instructions/Symptoms: Your throat may feel dry or sore from the anesthesia or the breathing tube placed in your throat during surgery. If this causes discomfort, gargle with warm salt water. The discomfort should disappear within 24 hours.

## 2022-03-21 NOTE — Transfer of Care (Signed)
Immediate Anesthesia Transfer of Care Note  Patient: Michael Horn  Procedure(s) Performed: Procedure(s) (LRB): TRANSURETHRAL RESECTION OF BLADDER TUMOR WITH GEMCITABINE (N/A)  Patient Location: PACU  Anesthesia Type: General  Level of Consciousness: awake, alert  and oriented  Airway & Oxygen Therapy: Patient Spontanous Breathing and Patient connected to face mask oxygen  Post-op Assessment: Report given to PACU RN and Post -op Vital signs reviewed and stable  Post vital signs: Reviewed and stable  Complications: No apparent anesthesia complications Last Vitals:  Vitals Value Taken Time  BP 160/78 03/21/22 1430  Temp 36.7 C 03/21/22 1415  Pulse 74 03/21/22 1432  Resp 11 03/21/22 1432  SpO2 97 % 03/21/22 1432  Vitals shown include unvalidated device data.  Last Pain:  Vitals:   03/21/22 1106  TempSrc: Oral         Complications: No notable events documented.

## 2022-03-21 NOTE — Interval H&P Note (Signed)
History and Physical Interval Note:  03/21/2022 1:06 PM  Michael Horn  has presented today for surgery, with the diagnosis of BLADDER CANCER.  The various methods of treatment have been discussed with the patient and family. After consideration of risks, benefits and other options for treatment, the patient has consented to  Procedure(s) with comments: TRANSURETHRAL RESECTION OF BLADDER TUMOR WITH GEMCITABINE (N/A) - 30 MINS as a surgical intervention.  The patient's history has been reviewed, patient examined, no change in status, stable for surgery.  I have reviewed the patient's chart and labs.  Questions were answered to the patient's satisfaction.     Lillette Boxer Shalamar Plourde

## 2022-03-21 NOTE — Anesthesia Procedure Notes (Signed)
Procedure Name: LMA Insertion Date/Time: 03/21/2022 1:25 PM  Performed by: Mechele Claude, CRNAPre-anesthesia Checklist: Patient identified, Emergency Drugs available, Suction available and Patient being monitored Patient Re-evaluated:Patient Re-evaluated prior to induction Oxygen Delivery Method: Circle system utilized Preoxygenation: Pre-oxygenation with 100% oxygen Induction Type: IV induction Ventilation: Mask ventilation without difficulty LMA: LMA inserted LMA Size: 5.0 Number of attempts: 1 Airway Equipment and Method: Bite block Placement Confirmation: positive ETCO2 Tube secured with: Tape Dental Injury: Teeth and Oropharynx as per pre-operative assessment

## 2022-03-21 NOTE — Anesthesia Preprocedure Evaluation (Addendum)
Anesthesia Evaluation  Patient identified by MRN, date of birth, ID band Patient awake    Reviewed: Allergy & Precautions, NPO status , Patient's Chart, lab work & pertinent test results  History of Anesthesia Complications (+) Emergence Delirium and history of anesthetic complications  Airway Mallampati: II  TM Distance: >3 FB Neck ROM: Full    Dental  (+) Edentulous Upper, Edentulous Lower   Pulmonary sleep apnea and Continuous Positive Airway Pressure Ventilation , COPD, Current Smoker   Pulmonary exam normal        Cardiovascular hypertension, Pt. on medications + CAD  + dysrhythmias Atrial Fibrillation  Rhythm:Regular Rate:Normal     Neuro/Psych  Headaches   Depression       GI/Hepatic Neg liver ROS, hiatal hernia,GERD  Medicated,,  Endo/Other  diabetes, Type 2, Oral Hypoglycemic Agents, Insulin Dependent    Renal/GU  Bladder dysfunction      Musculoskeletal  (+) Arthritis ,    Abdominal Normal abdominal exam  (+)   Peds  Hematology   Anesthesia Other Findings   Reproductive/Obstetrics                             Anesthesia Physical Anesthesia Plan  ASA: 2  Anesthesia Plan: General   Post-op Pain Management:    Induction: Intravenous  PONV Risk Score and Plan: 1 and Ondansetron, Dexamethasone and Treatment may vary due to age or medical condition  Airway Management Planned: Mask and Oral ETT  Additional Equipment: None  Intra-op Plan:   Post-operative Plan: Extubation in OR  Informed Consent: I have reviewed the patients History and Physical, chart, labs and discussed the procedure including the risks, benefits and alternatives for the proposed anesthesia with the patient or authorized representative who has indicated his/her understanding and acceptance.     Dental advisory given  Plan Discussed with: CRNA  Anesthesia Plan Comments:        Anesthesia  Quick Evaluation

## 2022-03-21 NOTE — H&P (Signed)
H&P  Chief Complaint: Recurrent bladder cancer  History of Present Illness: 72 year old male, previous patient of Dr. Kathie Rhodes, presents at this time for repeat TURBT.  He has a history of recurrent urothelial carcinoma of the bladder, and recent cystoscopy revealed recurrence.  This is his sixth recurrence since his original resection in 2012.  He has been treated with immune therapy.  Recent cystoscopy revealed 2 recurrences at the dome of the bladder approximately 1.5 cm in diameter each.  Past Medical History:  Diagnosis Date   Arthritis    Arthritis -DDD."chronic pain med used"   Bladder tumor    states that it was cancer   Cancer ([place])    Bladder cancer"-Dr.Ottelin- checks every 3 months--no signs last checks.   Chronic knee pain RIGHT KNEE --  S/P KNEE REPLACEMENT JAN 2012--  PT STATES  NEEDS ANOTHER REPLACEMENT DUE TO JOINT RECALL   Complication of anesthesia EMERGENCE DELIRIUM   occurred x1- 2 yrs ago.few surgeries ago, none recently   COPD (chronic obstructive pulmonary disease) ([place])    Coronary artery disease    Depression    DM type 2 (diabetes mellitus, type 2) (H[facility])    novolog BID and oral agents   GERD (gastroesophageal reflux disease)    H/O hiatal hernia    Headache    History of bladder cancer TCC OF BLADDER  --  FOLLOWED BY DR Karsten Ro   History of peptic ulcer AGE 5   Hypertension    pt denies, takes lisimopril for kidneys   Mild obstructive sleep apnea    CPAP   New onset a-fib ([place]) 02/28/2022   with RVR, during hospitalization, will use 30 day heart monitor s/p urology surgery   Nocturia    Peripheral neuropathy BOTTOM RIGHT FOOT   Seasonal allergies    Short of breath on exertion    Urgency of urination     Past Surgical History:  Procedure Laterality Date   BACK SURGERY  X2   lower back   BILATERAL KNEE ARTHROSCOPY     BILATERAL SHOULDER SURG.  2003   CARDIAC CATHETERIZATION  04-13-2007   ---- DR Irish Lack   NO SIGNIFICANT CAD/ NORMAL LVF    CARPAL TUNNEL RELEASE  04/11/2006   RIGHT   COLONOSCOPY WITH PROPOFOL N/A 05/01/2015   Procedure: COLONOSCOPY WITH PROPOFOL;  Surgeon: Garlan Fair, MD;  Location: [facility];  Service: Endoscopy;  Laterality: N/A;   CYSTOSCOPY W/ RETROGRADES Bilateral 12/23/2019   Procedure: CYSTOSCOPY WITH RETROGRADE PYELOGRAM;  Surgeon: Franchot Gallo, MD;  Location: [facility];  Service: Urology;  Laterality: Bilateral;   HAND SURGERY Right    cyst removed   LEFT CARPAL. TUNNEL RELEASE     LEFT EYE SURG.   2008   REMOVAL OF BB   REPAIR RECURRENT LEFT ROTATOR CUFF TEAR  01/02/2011   REVERSE SHOULDER ARTHROPLASTY Left 04/08/2018   Procedure: REVERSE SHOULDER ARTHROPLASTY;  Surgeon: Nicholes Stairs, MD;  Location: [place];  Service: Orthopedics;  Laterality: Left;  2.5 hours   SIGMOID COLECTOMY  09/29/2001   DIVERTICULITIS   TOTAL KNEE ARTHROPLASTY  04/03/2010   RIGHT   TRANSURETHRAL RESECTION OF BLADDER TUMOR  07/06/2010   TRANSURETHRAL RESECTION OF BLADDER TUMOR  05/20/2011   Procedure: TRANSURETHRAL RESECTION OF BLADDER TUMOR (TURBT);  Surgeon: Claybon Jabs, MD;  Location: [facility];  Service: Urology;  Laterality: N/A;  GYRUS INSTILL MYTOMYCIN C NO BED   TRANSURETHRAL RESECTION OF BLADDER TUMOR  01/31/2012  Procedure: TRANSURETHRAL RESECTION OF BLADDER TUMOR (TURBT);  Surgeon: Claybon Jabs, MD;  Location: [facility];  Service: Urology;  Laterality: N/A;   TRANSURETHRAL RESECTION OF BLADDER TUMOR WITH GYRUS (TURBT-GYRUS) N/A 11/22/2013   Procedure: Bladder biopsy with fulgeration;  Surgeon: Claybon Jabs, MD;  Location: [facility];  Service: Urology;  Laterality: N/A;   TRANSURETHRAL RESECTION OF BLADDER TUMOR WITH MITOMYCIN-C N/A 12/23/2019   Procedure: TRANSURETHRAL RESECTION OF BLADDER TUMOR WITH POST-OP  GEMCITABINE;  Surgeon: Franchot Gallo, MD;  Location: [facility];  Service: Urology;   Laterality: N/A;  45 MINS    Home Medications:    Allergies:  Allergies  Allergen Reactions   Aripiprazole     Other reaction(s): Unknown   Contrast Media [Iodinated Contrast Media] Rash   Dulaglutide     Other reaction(s): Unknown   Rosuvastatin     Other reaction(s): Unknown    Family History  Problem Relation Age of Onset   Heart attack Mother    Lung cancer Nephew    Colon cancer Neg Hx    Rectal cancer Neg Hx    Stomach cancer Neg Hx     Social History:  reports that he has been smoking cigarettes. He has a 120.00 pack-year smoking history. He has never used smokeless tobacco. He reports that he does not drink alcohol and does not use drugs.  ROS: A complete review of systems was performed.  All systems are negative except for pertinent findings as noted.  Physical Exam:  Vital signs in last 24 hours: BP (!) 170/75   Pulse 81   Temp 97.9 F (36.6 C) (Oral)   Resp 18   Ht '5\' 10"'$  (1.778 m)   Wt 124.1 kg   SpO2 97%   BMI 39.27 kg/m  Constitutional:  Alert and oriented, No acute distress Cardiovascular: Regular rate  Respiratory: Normal respiratory effort GI: Abdomen is soft, nontender, nondistended, no abdominal masses. No CVAT.  Genitourinary: Normal male phallus, testes are descended bilaterally and non-tender and without masses, scrotum is normal in appearance without lesions or masses, perineum is normal on inspection. Lymphatic: No lymphadenopathy Neurologic: Grossly intact, no focal deficits Psychiatric: Normal mood and affect  I have reviewed prior pt notes  I have reviewed notes from referring/previous physicians  I have reviewed urinalysis results  I have independently reviewed prior imaging  I have reviewed prior PSA results  I have reviewed prior urine culture   Impression/Assessment:  Recurrent urothelial carcinoma bladder  Plan:  TURBT, gemcitabine placement

## 2022-03-22 ENCOUNTER — Ambulatory Visit: Payer: PPO | Admitting: Adult Health

## 2022-03-22 LAB — SURGICAL PATHOLOGY

## 2022-03-22 NOTE — Anesthesia Postprocedure Evaluation (Signed)
Anesthesia Post Note  Patient: Michael Horn  Procedure(s) Performed: TRANSURETHRAL RESECTION OF BLADDER TUMOR WITH GEMCITABINE (Bladder)     Patient location during evaluation: PACU Anesthesia Type: General Level of consciousness: awake and alert Pain management: pain level controlled Vital Signs Assessment: post-procedure vital signs reviewed and stable Respiratory status: spontaneous breathing, nonlabored ventilation, respiratory function stable and patient connected to nasal cannula oxygen Cardiovascular status: blood pressure returned to baseline and stable Postop Assessment: no apparent nausea or vomiting Anesthetic complications: no   No notable events documented.  Last Vitals:  Vitals:   03/21/22 1540 03/21/22 1615  BP: 138/65 133/69  Pulse: 70 70  Resp: 11 18  Temp:  36.7 C  SpO2: 93% 96%    Last Pain:  Vitals:   03/22/22 1518  TempSrc:   PainSc: 2                  March Rummage Patryce Depriest

## 2022-03-22 NOTE — Progress Notes (Signed)
Fentanyl 12mg wasted in stericycle. Witnessed by [employer] RN

## 2022-03-23 DIAGNOSIS — J449 Chronic obstructive pulmonary disease, unspecified: Secondary | ICD-10-CM | POA: Diagnosis not present

## 2022-03-25 ENCOUNTER — Encounter (HOSPITAL_BASED_OUTPATIENT_CLINIC_OR_DEPARTMENT_OTHER): Payer: Self-pay | Admitting: Urology

## 2022-03-26 ENCOUNTER — Ambulatory Visit (INDEPENDENT_AMBULATORY_CARE_PROVIDER_SITE_OTHER): Payer: PPO

## 2022-03-26 ENCOUNTER — Encounter: Payer: Self-pay | Admitting: Nurse Practitioner

## 2022-03-26 ENCOUNTER — Ambulatory Visit (INDEPENDENT_AMBULATORY_CARE_PROVIDER_SITE_OTHER): Payer: PPO | Admitting: Nurse Practitioner

## 2022-03-26 VITALS — BP 110/60 | HR 85 | Temp 98.3°F | Ht 70.0 in | Wt 272.8 lb

## 2022-03-26 DIAGNOSIS — J449 Chronic obstructive pulmonary disease, unspecified: Secondary | ICD-10-CM | POA: Insufficient documentation

## 2022-03-26 DIAGNOSIS — R509 Fever, unspecified: Secondary | ICD-10-CM | POA: Diagnosis not present

## 2022-03-26 DIAGNOSIS — C679 Malignant neoplasm of bladder, unspecified: Secondary | ICD-10-CM

## 2022-03-26 DIAGNOSIS — R0609 Other forms of dyspnea: Secondary | ICD-10-CM | POA: Diagnosis not present

## 2022-03-26 DIAGNOSIS — R059 Cough, unspecified: Secondary | ICD-10-CM | POA: Diagnosis not present

## 2022-03-26 DIAGNOSIS — J441 Chronic obstructive pulmonary disease with (acute) exacerbation: Secondary | ICD-10-CM | POA: Diagnosis not present

## 2022-03-26 DIAGNOSIS — R0600 Dyspnea, unspecified: Secondary | ICD-10-CM | POA: Diagnosis not present

## 2022-03-26 MED ORDER — PREDNISONE 10 MG PO TABS: ORAL_TABLET | ORAL | 0 refills | Status: DC

## 2022-03-26 MED ORDER — DOXYCYCLINE HYCLATE 100 MG PO TABS: 100.0000 mg | ORAL_TABLET | Freq: Two times a day (BID) | ORAL | 0 refills | Status: AC

## 2022-03-26 MED ORDER — METHYLPREDNISOLONE ACETATE 80 MG/ML IJ SUSP
80.0000 mg | Freq: Once | INTRAMUSCULAR | Status: AC
  Administered 2022-03-26: 80 mg via INTRAMUSCULAR

## 2022-03-26 MED ORDER — LEVALBUTEROL HCL 0.63 MG/3ML IN NEBU: 0.6300 mg | INHALATION_SOLUTION | Freq: Four times a day (QID) | RESPIRATORY_TRACT | 5 refills | Status: AC | PRN

## 2022-03-26 NOTE — Assessment & Plan Note (Signed)
AECOPD. Non-toxic and VS stable on room air. Concern for pneumonia vs PE given recent surgery. CXR without superimposed infection. Wells score low probability and given his improvement with bronchodilators, less likely. Felt his symptoms were most consistent with acute COPD exacerbation. Depo injection 80 mg x 1 in office. He just completed neb prior to arrival and declined additional treatment. We will treat him with empiric doxycycline course and prednisone taper. Maximize bronchodilator and mucociliary clearance therapies. Strict return/ED precautions.   Patient Instructions  Continue Breztri 2 puffs Twice daily. Brush tongue and rinse mouth afterwards Continue Albuterol inhaler 2 puffs or levalbuterol 3 mL neb every 6 hours as needed for shortness of breath or wheezing. Notify if symptoms persist despite rescue inhaler/neb use. Use your nebulizer treatments four times a day until symptoms improve Continue CPAP nightly, minimum of 4-6 hours  Guaifenesin [phone] mg Twice daily for chest congestion/cough Doxycycline 1 tab Twice daily for 7 days. Take with food. Wear sunscreen when taking Prednisone taper. 4 tabs for 2 days, then 3 tabs for 2 days, 2 tabs for 2 days, then 1 tab for 2 days, then stop. Take in AM with food. Start tomorrow  If your symptoms rapidly worsen, you have trouble talking or extreme difficulty breathing, or you're not getting relief from your nebulizer/rescue, go to the emergency department  Follow up in 7-10 days with Dr. Shearon Stalls or Alanson Aly. If symptoms do not improve or worsen, please contact office for sooner follow up or seek emergency care.

## 2022-03-26 NOTE — Patient Instructions (Addendum)
Continue Breztri 2 puffs Twice daily. Brush tongue and rinse mouth afterwards Continue Albuterol inhaler 2 puffs or levalbuterol 3 mL neb every 6 hours as needed for shortness of breath or wheezing. Notify if symptoms persist despite rescue inhaler/neb use. Use your nebulizer treatments four times a day until symptoms improve Continue CPAP nightly, minimum of 4-6 hours  Guaifenesin [phone] mg Twice daily for chest congestion/cough Doxycycline 1 tab Twice daily for 7 days. Take with food. Wear sunscreen when taking Prednisone taper. 4 tabs for 2 days, then 3 tabs for 2 days, 2 tabs for 2 days, then 1 tab for 2 days, then stop. Take in AM with food. Start tomorrow  If your symptoms rapidly worsen, you have trouble talking or extreme difficulty breathing, or you're not getting relief from your nebulizer/rescue, go to the emergency department  Follow up in 7-10 days with Dr. Shearon Stalls or Alanson Aly. If symptoms do not improve or worsen, please contact office for sooner follow up or seek emergency care.

## 2022-03-26 NOTE — Progress Notes (Signed)
$'@Patient'X$  ID: Tami Ribas, male    DOB: [birth year], 72 y.o.   MRN: [id number]  Chief Complaint  Patient presents with   Follow-up    Pt f/u he had surgery Thursday and wife reports his breathing has worsened. She also stated the he had a fever of 101.4 on Sunday. He just had a Duoneb treatment at 10am     Referring provider: Mayra Neer, MD  HPI: 72 year old male, active smoker (quit 03/18/2022) followed for COPD with chronic bronchitis and emphysema, and OSA on CPAP. He is followed by Dr. Shearon Stalls for COPD and Dr. Elsworth Soho for sleep. Past medical history significant for DM, obesity, HLD. He was recently diagnosed with bladder cancer.   TEST/EVENTS:  02/02/2021 PFT: FVC 82, FEV1 77, ratio 71, TLC 105, DLCOcor 89. No BD 04/2021 HST: AHI 7/h, supine 15/h; lowest SpO2 77% 07/09/2021 CPAP titration >> optimal pressure 15 cmH2O with residual AHI 1.4, SpO2 min at optimal pressure 89%  12/20/2021: OV with Dr. Shearon Stalls. Seen Dr. Elsworth Soho for OSA; started on CPAP. Feels sleep has improved. Excellent compliance with AHI <1/h. Not using Breztri consistently. Still smoking 1.5 ppd. Having wheezing, chest tightness. Coughing up clear phlegm. Asking if he needs oxygen. Instructed to resume Breztri inhaler for maintenance and albuterol for rescue. Needs referral to lung cancer screening program. Walk test with SpO2 low 92% on room air. No need for supplemental O2.   03/26/2022: Today - acute Patient presents today with his wife for acute visit. He was recently diagnosed with bladder cancer and underwent resection on 03/21/2022 with Dr. Diona Fanti. This past Saturday, 1/14, he started having fevers (t max 101.4), increased shortness of breath and a productive cough with yellow to brown sputum. He has not had any more fevers but continues to have trouble with his breathing and congestion. He feels like the only time he gets relief is when he uses his son's nebulizer treatments. They do leave him feeling very jittery  though. He denies any calf pain/swelling, hemoptysis, chills.   Allergies  Allergen Reactions   Aripiprazole     Other reaction(s): Unknown   Contrast Media [Iodinated Contrast Media] Rash   Dulaglutide     Other reaction(s): Unknown   Rosuvastatin     Other reaction(s): Unknown    Immunization History  Administered Date(s) Administered   Fluad Quad(high Dose 65+) 01/18/2021   Influenza Split 01/14/2008, 01/26/2009, 01/21/2012, 12/30/2012, 12/24/2016, 01/12/2018, 01/16/2021   Influenza, High Dose Seasonal PF 01/12/2018   Influenza,inj,Quad PF,6+ Mos 01/15/2011   Influenza,inj,quad, With Preservative 12/27/2013, 01/12/2018   Moderna Sars-Covid-2 Vaccination 04/02/2019, 05/03/2019, 02/11/2020   Pneumococcal Conjugate-13 08/04/2014   Pneumococcal Polysaccharide-23 01/12/2007, 01/12/2016   Td 02/11/2004   Tdap 10/23/2009   Zoster, Live 06/04/2012, 03/23/2021    Past Medical History:  Diagnosis Date   Arthritis    Arthritis -DDD."chronic pain med used"   Bladder tumor    states that it was cancer   Cancer ([place])    Bladder cancer"-Dr.Ottelin- checks every 3 months--no signs last checks.   Chronic knee pain RIGHT KNEE --  S/P KNEE REPLACEMENT JAN 2012--  PT STATES  NEEDS ANOTHER REPLACEMENT DUE TO JOINT RECALL   Complication of anesthesia EMERGENCE DELIRIUM   occurred x1- 2 yrs ago.few surgeries ago, none recently   COPD (chronic obstructive pulmonary disease) ([place])    Coronary artery disease    Depression    DM type 2 (diabetes mellitus, type 2) (HCC)    novolog BID and oral  agents   GERD (gastroesophageal reflux disease)    H/O hiatal hernia    Headache    History of bladder cancer [facility] OF BLADDER  --  FOLLOWED BY DR Karsten Ro   History of peptic ulcer AGE 65   Hypertension    pt denies, takes lisimopril for kidneys   Mild obstructive sleep apnea    CPAP   New onset a-fib ([place]) 02/28/2022   with RVR, during hospitalization, will use 30 day heart monitor s/p urology  surgery   Nocturia    Peripheral neuropathy BOTTOM RIGHT FOOT   Seasonal allergies    Short of breath on exertion    Urgency of urination     Tobacco History: Social History   Tobacco Use  Smoking Status Former   Packs/day: 2.00   Years: 60.00   Total pack years: 120.00   Types: Cigarettes   Quit date: 03/18/2022   Years since quitting: 0.0  Smokeless Tobacco Never  Tobacco Comments   Stopped smoking 03/18/2022 [facility]    Counseling given: Not Answered Tobacco comments: Stopped smoking 03/18/2022 [facility]    Outpatient Medications Prior to Visit  Medication Sig Dispense Refill   albuterol (VENTOLIN HFA) 108 (90 Base) MCG/ACT inhaler Inhale 2 puffs into the lungs every 4 (four) hours as needed for wheezing or shortness of breath. 1 each 5   aspirin EC 81 MG tablet Take 81 mg by mouth 4 (four) times a week. Swallow whole.     Budeson-Glycopyrrol-Formoterol (BREZTRI AEROSPHERE) 160-9-4.8 MCG/ACT AERO Inhale 2 puffs into the lungs in the morning and at bedtime. 10.7 g 5   diclofenac Sodium (VOLTAREN ARTHRITIS PAIN) 1 % GEL Apply 2 g topically 2 (two) times daily as needed (pain).     docusate sodium (COLACE) 100 MG capsule Take 100 mg by mouth daily as needed for mild constipation.     esomeprazole (NEXIUM) 40 MG capsule TAKE 1 CAPSULE BY MOUTH ONCE DAILY (APPOINTMENT  NEEDED  FOR  FURTHER  REFILLS) (Patient taking differently: Take 40 mg by mouth daily.) 30 capsule 2   ezetimibe (ZETIA) 10 MG tablet Take 10 mg by mouth at bedtime.     hyoscyamine (LEVSIN) 0.125 MG tablet Take 0.125 mg by mouth every 8 (eight) hours as needed for cramping.     insulin aspart protamine- aspart (NOVOLOG MIX 70/30) (70-30) 100 UNIT/ML injection Inject 55 Units into the skin 2 (two) times daily.     lisinopril (PRINIVIL,ZESTRIL) 10 MG tablet Take 10 mg by mouth every morning.     metFORMIN (GLUCOPHAGE) 1000 MG tablet Take 1,000 mg by mouth 2 (two) times daily.     nicotine (NICODERM CQ - DOSED IN MG/24 HOURS) 21  mg/24hr patch Place 1 patch (21 mg total) onto the skin daily. 28 patch 0   Oxycodone HCl 10 MG TABS Take 10 mg by mouth every 6 (six) hours as needed (pain).     pravastatin (PRAVACHOL) 40 MG tablet Take 40 mg by mouth daily.     pregabalin (LYRICA) 150 MG capsule Take 150 mg by mouth 2 (two) times daily.     tamsulosin (FLOMAX) 0.4 MG CAPS capsule Take 0.4 mg by mouth every morning.     traZODone (DESYREL) 100 MG tablet Take 100 mg by mouth at bedtime.     trospium (SANCTURA) 20 MG tablet Take 20 mg by mouth 2 (two) times daily.     oxybutynin (DITROPAN) 5 MG tablet 1 p.o. every 6 hours as needed bladder spasms/bladder pain (  Patient not taking: Reported on 03/26/2022) 15 tablet 1   senna-docusate (SENOKOT-S) 8.6-50 MG tablet Take 1 tablet by mouth 2 (two) times daily between meals as needed for moderate constipation. (Patient not taking: Reported on 03/26/2022)  0   sulfamethoxazole-trimethoprim (BACTRIM DS) 800-160 MG tablet Take 1 tablet by mouth 2 (two) times daily. (Patient not taking: Reported on 03/26/2022) 6 tablet 0   No facility-administered medications prior to visit.     Review of Systems:   Constitutional: No weight loss or gain, night sweats, chills. +fatigue, lassitude, fevers (resolved) HEENT: No headaches, difficulty swallowing, tooth/dental problems, or sore throat. No sneezing, itching, ear ache, nasal congestion, or post nasal drip CV:  No chest pain, orthopnea, PND, swelling in lower extremities, anasarca, dizziness, palpitations, syncope Resp: +shortness of breath with exertion/rest; productive cough; chest congestion; wheezing. No hemoptysis. No chest wall deformity GI:  No heartburn, indigestion, abdominal pain, nausea, vomiting, diarrhea, change in bowel habits, loss of appetite, bloody stools.  GU: No dysuria, change in color of urine, urgency or frequency.   Skin: No rash, lesions, ulcerations MSK:  No joint pain or swelling.   Neuro: No dizziness or lightheadedness.   Psych: No depression or anxiety. Mood stable.     Physical Exam:  BP 110/60   Pulse 85   Temp 98.3 F (36.8 C) (Oral)   Ht '5\' 10"'$  (1.778 m)   Wt 272 lb 12.8 oz (123.7 kg)   SpO2 97%   BMI 39.14 kg/m   GEN: Pleasant, interactive, chronically ill appearing; in no acute distress HEENT:  Normocephalic and atraumatic. PERRLA. Sclera white. Nasal turbinates pink, moist and patent bilaterally. No rhinorrhea present. Oropharynx pink and moist, without exudate or edema. No lesions, ulcerations, or postnasal drip.  NECK:  Supple w/ fair ROM. No JVD present. Normal carotid impulses w/o bruits. Thyroid symmetrical with no goiter or nodules palpated. No lymphadenopathy.   CV: RRR, no m/r/g, no peripheral edema. Pulses intact, +2 bilaterally. No cyanosis, pallor or clubbing. PULMONARY:  Unlabored, regular breathing. Scattered rhonchi and expiratory wheezes bilaterally A&P. No accessory muscle use.  GI: BS present and normoactive. Soft, non-tender to palpation. No organomegaly or masses detected.  MSK: No erythema, warmth or tenderness. Cap refil <2 sec all extrem. No deformities or joint swelling noted.  Neuro: A/Ox3. No focal deficits noted.   Skin: Warm, no lesions or rashe Psych: Normal affect and behavior. Judgement and thought content appropriate.     Lab Results:  CBC    Component Value Date/Time   WBC 8.5 03/01/2022 0823   RBC 4.94 03/01/2022 0823   HGB 11.9 (L) 03/21/2022 1142   HCT 35.0 (L) 03/21/2022 1142   PLT 215 03/01/2022 0823   MCV 74.1 (L) 03/01/2022 0823   MCH 23.5 (L) 03/01/2022 0823   MCHC 31.7 03/01/2022 0823   RDW 16.0 (H) 03/01/2022 0823   LYMPHSABS 3.8 02/28/2022 1335   MONOABS 0.9 02/28/2022 1335   EOSABS 0.2 02/28/2022 1335   BASOSABS 0.1 02/28/2022 1335    BMET    Component Value Date/Time   NA 136 03/21/2022 1142   K 4.3 03/21/2022 1142   CL 100 03/21/2022 1142   CO2 24 03/01/2022 0823   GLUCOSE 182 (H) 03/21/2022 1142   BUN 13 03/21/2022 1142    CREATININE 0.90 03/21/2022 1142   CALCIUM 9.2 03/01/2022 0823   GFRNONAA >60 03/01/2022 0823   GFRAA >60 04/09/2018 0235    BNP No results found for: "BNP"   Imaging:  DG Chest 2 View  Result Date: 03/26/2022 CLINICAL DATA:  Recent surgery 03/21/2022. New onset of fevers. Productive cough. Increased dyspnea. EXAM: CHEST - 2 VIEW COMPARISON:  AP chest 02/28/2022, chest two views 02/21/2022 FINDINGS: Cardiac silhouette and mediastinal contours are within normal limits. There is again flattening of the diaphragms and mild hyperinflation. No pleural effusion or pneumothorax. Mild multilevel degenerative disc changes of the thoracic spine. 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 IMPRESSION: No active cardiopulmonary disease. Electronically Signed   By: Yvonne Kendall M.D.   On: 03/26/2022 11:35   Myocardial Perfusion Imaging  Result Date: 03/12/2022   The study is normal. The study is low risk.   No ST deviation was noted.   Left ventricular function is normal. Nuclear stress EF: 63 %. The left ventricular ejection fraction is normal (55-65%). End diastolic cavity size is normal. End systolic cavity size is normal.   Prior study not available for comparison. Negative stress test.  Low risk study.   CT CHEST WO CONTRAST  Result Date: 02/28/2022 CLINICAL DATA:  Dyspnea EXAM: CT CHEST WITHOUT CONTRAST TECHNIQUE: Multidetector CT imaging of the chest was performed following the standard protocol without IV contrast. RADIATION DOSE REDUCTION: This exam was performed according to the departmental dose-optimization program which includes automated exposure control, adjustment of the mA and/or kV according to patient size and/or use of iterative reconstruction technique. COMPARISON:  Chest x-ray 02/28/2022, chest CT 01/13/2014 FINDINGS: Cardiovascular: Limited evaluation due to absence of contrast. Moderate aortic atherosclerosis. No aneurysm. Coronary vascular calcification.  Normal cardiac size. No pericardial effusion Mediastinum/Nodes: Midline trachea. No thyroid mass. No suspicious lymph nodes. Esophagus within normal limits. Lungs/Pleura: Emphysema. No consolidative airspace disease, pleural effusion, or pneumothorax. Multiple punctate less than 5 mm nodules most evident in the apices/upper lobes. Upper Abdomen: No acute abnormality. Musculoskeletal: No chest wall mass or suspicious bone lesions identified. IMPRESSION: 1. Emphysema. No acute airspace disease. 2. Multiple punctate less than 5 mm bilateral pulmonary nodules. No follow-up needed if patient is low-risk (and has no known or suspected primary neoplasm). Non-contrast chest CT can be considered in 12 months if patient is high-risk. This recommendation follows the consensus statement: Guidelines for Management of Incidental Pulmonary Nodules Detected on CT Images: From the Fleischner Society 2017; Radiology 2017; 284:228-243. 3. Aortic atherosclerosis. Aortic Atherosclerosis (ICD10-I70.0) and Emphysema (ICD10-J43.9). Electronically Signed   By: Donavan Foil M.D.   On: 02/28/2022 18:54   CT Head Wo Contrast  Result Date: 02/28/2022 CLINICAL DATA:  Headaches, presyncope EXAM: CT HEAD WITHOUT CONTRAST TECHNIQUE: Contiguous axial images were obtained from the base of the skull through the vertex without intravenous contrast. RADIATION DOSE REDUCTION: This exam was performed according to the departmental dose-optimization program which includes automated exposure control, adjustment of the mA and/or kV according to patient size and/or use of iterative reconstruction technique. COMPARISON:  05/05/2017 FINDINGS: Brain: No acute intracranial findings are seen. There are no signs of bleeding within the cranium. Cortical sulci are prominent. There is decreased density in periventricular white matter. Vascular: Unremarkable. Skull: Unremarkable. Sinuses/Orbits: There is mucosal thickening in sphenoid, ethmoid and left maxillary  sinuses. There is possible old blowout fracture in the inferior aspect of medial wall of left orbit. There is mild inferior bulging of floor of right orbit. These findings have not changed since 05/04/2017 suggesting possible remote injuries. Other: None. IMPRESSION: No acute intracranial findings are seen in noncontrast CT brain. Atrophy. Small-vessel disease. Chronic sinusitis. Electronically Signed   By: Royston Cowper  Rathinasamy M.D.   On: 02/28/2022 16:50   DG Chest Portable 1 View  Result Date: 02/28/2022 CLINICAL DATA:  Recent treatment for pneumonia EXAM: PORTABLE CHEST 1 VIEW COMPARISON:  01/26/2020 FINDINGS: Left shoulder replacement. No focal opacity, pleural effusion, or pneumothorax. Normal cardiomediastinal silhouette. Aortic atherosclerosis IMPRESSION: No active disease. Electronically Signed   By: Donavan Foil M.D.   On: 02/28/2022 16:08    methylPREDNISolone acetate (DEPO-MEDROL) injection 80 mg     Date Action Dose Route User   03/26/2022 1147 Given 80 mg Intramuscular (Left Ventrogluteal) Priscille Kluver, CMA      regadenoson (LEXISCAN) injection SOLN 0.4 mg     Date Action Dose Route User   Discharged on 03/21/2022   Admitted on 03/21/2022   03/12/2022 1145 Given 0.4 mg Intravenous Dunn, Olivia B, RT      technetium tetrofosmin (TC-MYOVIEW) injection 16.1 millicurie     Date Action Dose Route User   Discharged on 03/21/2022   Admitted on 03/21/2022   03/12/2022 1010 Contrast Given 09.6 millicurie Intravenous Dunn, Olivia B, RT      technetium tetrofosmin (TC-MYOVIEW) injection 04.5 millicurie     Date Action Dose Route User   Discharged on 03/21/2022   Admitted on 03/21/2022   03/12/2022 1145 Contrast Given 40.9 millicurie Intravenous Dunn, Olivia B, RT          Latest Ref Rng & Units 02/02/2021   10:51 AM  PFT Results  FVC-Pre L 3.54   FVC-Predicted Pre % 82   FVC-Post L 3.68   FVC-Predicted Post % 85   Pre FEV1/FVC % % 69   Post FEV1/FCV % % 71   FEV1-Pre L 2.43    FEV1-Predicted Pre % 77   FEV1-Post L 2.60   DLCO uncorrected ml/min/mmHg 22.86   DLCO UNC% % 90   DLCO corrected ml/min/mmHg 22.73   DLCO COR %Predicted % 89   DLVA Predicted % 90   TLC L 7.28   TLC % Predicted % 105   RV % Predicted % 156     No results found for: "NITRICOXIDE"      Assessment & Plan:   COPD with acute exacerbation ([place]) AECOPD. Non-toxic and VS stable on room air. Concern for pneumonia vs PE given recent surgery. CXR without superimposed infection. Wells score low probability and given his improvement with bronchodilators, less likely. Felt his symptoms were most consistent with acute COPD exacerbation. Depo injection 80 mg x 1 in office. He just completed neb prior to arrival and declined additional treatment. We will treat him with empiric doxycycline course and prednisone taper. Maximize bronchodilator and mucociliary clearance therapies. Strict return/ED precautions.   Patient Instructions  Continue Breztri 2 puffs Twice daily. Brush tongue and rinse mouth afterwards Continue Albuterol inhaler 2 puffs or levalbuterol 3 mL neb every 6 hours as needed for shortness of breath or wheezing. Notify if symptoms persist despite rescue inhaler/neb use. Use your nebulizer treatments four times a day until symptoms improve Continue CPAP nightly, minimum of 4-6 hours  Guaifenesin [phone] mg Twice daily for chest congestion/cough Doxycycline 1 tab Twice daily for 7 days. Take with food. Wear sunscreen when taking Prednisone taper. 4 tabs for 2 days, then 3 tabs for 2 days, 2 tabs for 2 days, then 1 tab for 2 days, then stop. Take in AM with food. Start tomorrow  If your symptoms rapidly worsen, you have trouble talking or extreme difficulty breathing, or you're not getting relief from your nebulizer/rescue, go  to the emergency department  Follow up in 7-10 days with Dr. Shearon Stalls or Alanson Aly. If symptoms do not improve or worsen, please contact office for sooner  follow up or seek emergency care.     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 Fevers over the weekend up to 101.4. Resolved and no evidence of superimposed infection on imaging today. Possibly SIRS response? Advised him to monitor and notify his surgeon if he develops any postoperative complications.   I spent 45 minutes of dedicated to the care of this patient on the date of this encounter to include pre-visit review of records, face-to-face time with the patient discussing conditions above, post visit ordering of testing, clinical documentation with the electronic health record, making appropriate referrals as documented, and communicating necessary findings to members of the patients care team.  Clayton Bibles, NP 03/27/2022  Pt aware and understands NP's role.

## 2022-03-27 ENCOUNTER — Encounter: Payer: Self-pay | Admitting: Nurse Practitioner

## 2022-03-27 NOTE — Assessment & Plan Note (Signed)
Resection 03/21/2022. Fevers over the weekend up to 101.4. Resolved and no evidence of superimposed infection on imaging today. Possibly SIRS response? Advised him to monitor and notify his surgeon if he develops any postoperative complications.

## 2022-03-29 ENCOUNTER — Other Ambulatory Visit: Payer: Self-pay

## 2022-03-29 ENCOUNTER — Emergency Department (HOSPITAL_COMMUNITY)
Admission: EM | Admit: 2022-03-29 | Discharge: 2022-03-29 | Disposition: A | Discharge status: Home / Self Care | Payer: PPO | Attending: Emergency Medicine | Admitting: Emergency Medicine

## 2022-03-29 ENCOUNTER — Emergency Department (HOSPITAL_COMMUNITY): Payer: PPO

## 2022-03-29 ENCOUNTER — Telehealth: Payer: Self-pay | Admitting: Student

## 2022-03-29 DIAGNOSIS — Z87891 Personal history of nicotine dependence: Secondary | ICD-10-CM | POA: Diagnosis not present

## 2022-03-29 DIAGNOSIS — Z8551 Personal history of malignant neoplasm of bladder: Secondary | ICD-10-CM | POA: Diagnosis not present

## 2022-03-29 DIAGNOSIS — Z1152 Encounter for screening for COVID-19: Secondary | ICD-10-CM | POA: Diagnosis not present

## 2022-03-29 DIAGNOSIS — J449 Chronic obstructive pulmonary disease, unspecified: Secondary | ICD-10-CM | POA: Diagnosis not present

## 2022-03-29 DIAGNOSIS — R0602 Shortness of breath: Secondary | ICD-10-CM | POA: Diagnosis not present

## 2022-03-29 DIAGNOSIS — R7989 Other specified abnormal findings of blood chemistry: Secondary | ICD-10-CM | POA: Diagnosis not present

## 2022-03-29 DIAGNOSIS — Z794 Long term (current) use of insulin: Secondary | ICD-10-CM | POA: Diagnosis not present

## 2022-03-29 DIAGNOSIS — Z7982 Long term (current) use of aspirin: Secondary | ICD-10-CM | POA: Diagnosis not present

## 2022-03-29 LAB — CBC WITH DIFFERENTIAL/PLATELET
Abs Immature Granulocytes: 0.42 10*3/uL — ABNORMAL HIGH (ref 0.00–0.07)
Basophils Absolute: 0 10*3/uL (ref 0.0–0.1)
Basophils Relative: 0 %
Eosinophils Absolute: 0 10*3/uL (ref 0.0–0.5)
Eosinophils Relative: 0 %
HCT: 33.5 % — ABNORMAL LOW (ref 39.0–52.0)
Hemoglobin: 10.6 g/dL — ABNORMAL LOW (ref 13.0–17.0)
Immature Granulocytes: 6 %
Lymphocytes Relative: 21 %
Lymphs Abs: 1.5 10*3/uL (ref 0.7–4.0)
MCH: 23.2 pg — ABNORMAL LOW (ref 26.0–34.0)
MCHC: 31.6 g/dL (ref 30.0–36.0)
MCV: 73.3 fL — ABNORMAL LOW (ref 80.0–100.0)
Monocytes Absolute: 0.4 10*3/uL (ref 0.1–1.0)
Monocytes Relative: 6 %
Neutro Abs: 5 10*3/uL (ref 1.7–7.7)
Neutrophils Relative %: 67 %
Platelets: 180 10*3/uL (ref 150–400)
RBC: 4.57 MIL/uL (ref 4.22–5.81)
RDW: 15.9 % — ABNORMAL HIGH (ref 11.5–15.5)
WBC: 7.4 10*3/uL (ref 4.0–10.5)
nRBC: 0.7 % — ABNORMAL HIGH (ref 0.0–0.2)

## 2022-03-29 LAB — RESP PANEL BY RT-PCR (RSV, FLU A&B, COVID)  RVPGX2
Influenza A by PCR: NEGATIVE
Influenza B by PCR: NEGATIVE
Resp Syncytial Virus by PCR: NEGATIVE
SARS Coronavirus 2 by RT PCR: NEGATIVE

## 2022-03-29 LAB — BASIC METABOLIC PANEL
Anion gap: 13 (ref 5–15)
BUN: 26 mg/dL — ABNORMAL HIGH (ref 8–23)
CO2: 19 mmol/L — ABNORMAL LOW (ref 22–32)
Calcium: 8.9 mg/dL (ref 8.9–10.3)
Chloride: 101 mmol/L (ref 98–111)
Creatinine, Ser: 1.01 mg/dL (ref 0.61–1.24)
GFR, Estimated: >60 mL/min (ref >60)
Glucose, Bld: 239 mg/dL — ABNORMAL HIGH (ref 70–99)
Potassium: 3.9 mmol/L (ref 3.5–5.1)
Sodium: 133 mmol/L — ABNORMAL LOW (ref 135–145)

## 2022-03-29 LAB — TROPONIN I (HIGH SENSITIVITY): Troponin I (High Sensitivity): 4 ng/L (ref <18)

## 2022-03-29 LAB — D-DIMER, QUANTITATIVE: D-Dimer, Quant: 0.69 ug/mL-FEU — ABNORMAL HIGH (ref 0.00–0.50)

## 2022-03-29 LAB — BRAIN NATRIURETIC PEPTIDE: B Natriuretic Peptide: 36 pg/mL (ref 0.0–100.0)

## 2022-03-29 MED ORDER — METHYLPREDNISOLONE SODIUM SUCC 125 MG IJ SOLR
125.0000 mg | Freq: Once | INTRAMUSCULAR | Status: AC
  Administered 2022-03-29: 125 mg via INTRAVENOUS
  Filled 2022-03-29: qty 2

## 2022-03-29 MED ORDER — IPRATROPIUM-ALBUTEROL 0.5-2.5 (3) MG/3ML IN SOLN
3.0000 mL | Freq: Once | RESPIRATORY_TRACT | Status: AC
  Administered 2022-03-29: 3 mL via RESPIRATORY_TRACT
  Filled 2022-03-29: qty 3

## 2022-03-29 MED ORDER — IPRATROPIUM-ALBUTEROL 0.5-2.5 (3) MG/3ML IN SOLN: 3.0000 mL | RESPIRATORY_TRACT | 1 refills | Status: AC | PRN

## 2022-03-29 MED ORDER — MAGNESIUM SULFATE 2 GM/50ML IV SOLN
2.0000 g | Freq: Once | INTRAVENOUS | Status: AC
  Administered 2022-03-29: 2 g via INTRAVENOUS
  Filled 2022-03-29: qty 50

## 2022-03-29 NOTE — Discharge Instructions (Signed)
Continue your steroid medications.  You can also try taking the DuoNeb treatments to help with your breathing.  Follow-up with your pulmonary doctor to be rechecked to make sure your symptoms are improving

## 2022-03-29 NOTE — Telephone Encounter (Signed)
If he is not responding to treatment for AECOPD, he should seek further evaluation at the ED. He recently had surgery and at risk for blood clot, which is what we had discussed at his visit, so he will need workup to rule this out. Thanks.

## 2022-03-29 NOTE — ED Triage Notes (Signed)
Pt here from home with c/o sob , recently had surgery for bladder ca , also on antibiotics , steroids and had a negative chest x ray 3 days ago

## 2022-03-29 NOTE — Telephone Encounter (Signed)
Spoke with Delrae Rend (pt's wife per DPR) and instructed pt to seek evaluation at ED. Delrae Rend stated she will take pt to ED for evaluation. Nothing further needed at this time.   Routing to [employer] as [employer]

## 2022-03-29 NOTE — ED Provider Notes (Signed)
[facility] AT [facility] Provider Note   CSN: [id number] Arrival date & time: 03/29/22  1052     History  Chief complaint: Shortness of breath.  Michael Horn is a 72 y.o. male.  HPI   Patient has a history of COPD.  Patient is a chronic smoker quit smoking 11 days ago.  Patient has history of reflux peripheral neuropathy bladder cancer.  Patient was recently admitted to the hospital on January 11 where he had surgical procedure for the malignant neoplasm of his bladder.  Patient states since leaving the hospital he has been having episodes of feeling short of breath.  He saw his pulmonary doctor on January 16.  He was treated with steroids and breathing treatments.  Patient states he is not feeling worse but is not feeling any better.  He contacted the pulmonary provider and was told he needed to come to the ED to evaluate for possible blood clot as he did have recent surgery.  Patient states he gets short of breath when he tries to exert himself.  He has been using his albuterol treatments  Home Medications Prior to Admission medications   Medication Sig Start Date End Date Taking? Authorizing Provider  ipratropium-albuterol (DUONEB) 0.5-2.5 (3) MG/3ML SOLN Take 3 mLs by nebulization every 4 (four) hours as needed. 03/29/22  Yes Dorie Rank, MD  albuterol (VENTOLIN HFA) 108 (90 Base) MCG/ACT inhaler Inhale 2 puffs into the lungs every 4 (four) hours as needed for wheezing or shortness of breath. 01/02/21   Spero Geralds, MD  aspirin EC 81 MG tablet Take 81 mg by mouth 4 (four) times a week. Swallow whole.    [provider]  Budeson-Glycopyrrol-Formoterol (BREZTRI AEROSPHERE) 160-9-4.8 MCG/ACT AERO Inhale 2 puffs into the lungs in the morning and at bedtime. 01/29/22   Spero Geralds, MD  diclofenac Sodium (VOLTAREN ARTHRITIS PAIN) 1 % GEL Apply 2 g topically 2 (two) times daily as needed (pain).    [provider]  docusate sodium  (COLACE) 100 MG capsule Take 100 mg by mouth daily as needed for mild constipation.    [provider]  doxycycline (VIBRA-TABS) 100 MG tablet Take 1 tablet (100 mg total) by mouth 2 (two) times daily for 7 days. 03/26/22 04/02/22  Cobb, Karie Schwalbe, NP  esomeprazole (NEXIUM) 40 MG capsule TAKE 1 CAPSULE BY MOUTH ONCE DAILY (APPOINTMENT  NEEDED  FOR  FURTHER  REFILLS) Patient taking differently: Take 40 mg by mouth daily. 01/28/22   Levin Erp, PA  ezetimibe (ZETIA) 10 MG tablet Take 10 mg by mouth at bedtime.    [provider]  hyoscyamine (LEVSIN) 0.125 MG tablet Take 0.125 mg by mouth every 8 (eight) hours as needed for cramping.    [provider]  insulin aspart protamine- aspart (NOVOLOG MIX 70/30) (70-30) 100 UNIT/ML injection Inject 55 Units into the skin 2 (two) times daily.    [provider]  levalbuterol Penne Lash) 0.63 MG/3ML nebulizer solution Take 3 mLs (0.63 mg total) by nebulization every 6 (six) hours as needed for wheezing or shortness of breath. 03/26/22   Cobb, Karie Schwalbe, NP  lisinopril (PRINIVIL,ZESTRIL) 10 MG tablet Take 10 mg by mouth every morning.    [provider]  metFORMIN (GLUCOPHAGE) 1000 MG tablet Take 1,000 mg by mouth 2 (two) times daily.    [provider]  nicotine (NICODERM CQ - DOSED IN MG/24 HOURS) 21 mg/24hr patch Place 1 patch (21 mg  total) onto the skin daily. 03/02/22   Mercy Riding, MD  oxybutynin (DITROPAN) 5 MG tablet 1 p.o. every 6 hours as needed bladder spasms/bladder pain Patient not taking: Reported on 03/26/2022 03/21/22   Franchot Gallo, MD  Oxycodone HCl 10 MG TABS Take 10 mg by mouth every 6 (six) hours as needed (pain). 08/05/21   [provider]  pravastatin (PRAVACHOL) 40 MG tablet Take 40 mg by mouth daily.    [provider]  predniSONE (DELTASONE) 10 MG tablet 4 tabs for 2 days, then 3 tabs for 2 days, 2 tabs for 2 days, then 1 tab for 2 days, then stop  03/26/22   Cobb, Karie Schwalbe, NP  pregabalin (LYRICA) 150 MG capsule Take 150 mg by mouth 2 (two) times daily. 10/30/20   [provider]  senna-docusate (SENOKOT-S) 8.6-50 MG tablet Take 1 tablet by mouth 2 (two) times daily between meals as needed for moderate constipation. Patient not taking: Reported on 03/26/2022 03/01/22   Mercy Riding, MD  sulfamethoxazole-trimethoprim (BACTRIM DS) 800-160 MG tablet Take 1 tablet by mouth 2 (two) times daily. Patient not taking: Reported on 03/26/2022 03/21/22   Franchot Gallo, MD  tamsulosin (FLOMAX) 0.4 MG CAPS capsule Take 0.4 mg by mouth every morning.    [provider]  traZODone (DESYREL) 100 MG tablet Take 100 mg by mouth at bedtime.    [provider]  trospium (SANCTURA) 20 MG tablet Take 20 mg by mouth 2 (two) times daily.    [provider]      Allergies    Aripiprazole, Contrast media [iodinated contrast media], Dulaglutide, and Rosuvastatin    Review of Systems   Review of Systems  Physical Exam Updated Vital Signs BP (!) 153/70   Pulse 72   Temp 98.1 F (36.7 C) (Oral)   Resp 19   SpO2 94%  Physical Exam Vitals and nursing note reviewed.  Constitutional:      Appearance: He is well-developed. He is not diaphoretic.  HENT:     Head: Normocephalic and atraumatic.     Right Ear: External ear normal.     Left Ear: External ear normal.  Eyes:     General: No scleral icterus.       Right eye: No discharge.        Left eye: No discharge.     Conjunctiva/sclera: Conjunctivae normal.  Neck:     Trachea: No tracheal deviation.  Cardiovascular:     Rate and Rhythm: Normal rate and regular rhythm.  Pulmonary:     Effort: Pulmonary effort is normal. No respiratory distress.     Breath sounds: No stridor. Wheezing present. No rales.  Abdominal:     General: Bowel sounds are normal. There is no distension.     Palpations: Abdomen is soft.     Tenderness: There is no abdominal tenderness.  There is no guarding or rebound.  Musculoskeletal:        General: No tenderness or deformity.     Cervical back: Neck supple.  Skin:    General: Skin is warm and dry.     Findings: No rash.  Neurological:     General: No focal deficit present.     Mental Status: He is alert.     Cranial Nerves: No cranial nerve deficit, dysarthria or facial asymmetry.     Sensory: No sensory deficit.     Motor: No abnormal muscle tone or seizure activity.     Coordination:  Coordination normal.  Psychiatric:        Mood and Affect: Mood normal.     ED Results / Procedures / Treatments   Labs (all labs ordered are listed, but only abnormal results are displayed) Labs Reviewed  BASIC METABOLIC PANEL - Abnormal; Notable for the following components:      Result Value   Sodium 133 (*)    CO2 19 (*)    Glucose, Bld 239 (*)    BUN 26 (*)    All other components within normal limits  CBC WITH DIFFERENTIAL/PLATELET - Abnormal; Notable for the following components:   Hemoglobin 10.6 (*)    HCT 33.5 (*)    MCV 73.3 (*)    MCH 23.2 (*)    RDW 15.9 (*)    nRBC 0.7 (*)    Abs Immature Granulocytes 0.42 (*)    All other components within normal limits  D-DIMER, QUANTITATIVE - Abnormal; Notable for the following components:   D-Dimer, Quant 0.69 (*)    All other components within normal limits  RESP PANEL BY RT-PCR (RSV, FLU A&B, COVID)  [id number]  BRAIN NATRIURETIC PEPTIDE  TROPONIN I (HIGH SENSITIVITY)    EKG None  Radiology No results found.  Procedures Procedures    Medications Ordered in ED Medications  ipratropium-albuterol (DUONEB) 0.5-2.5 (3) MG/3ML nebulizer solution 3 mL (3 mLs Nebulization Given 03/29/22 1248)  methylPREDNISolone sodium succinate (SOLU-MEDROL) 125 mg/2 mL injection 125 mg (125 mg Intravenous Given 03/29/22 1248)  magnesium sulfate IVPB 2 g 50 mL (0 g Intravenous Stopped 03/29/22 1348)    ED Course/ Medical Decision Making/ A&P Clinical Course as of 03/29/22 1526   Fri Mar 29, 2022  1230 Discussed chest x-ray with the patient.  He does not want to repeat one at this time.  He just had a checks x-ray a couple days ago.  Patient may end up needing CT angio if his D-dimer is elevated.  Will treat his bronchospasm for now [JK]  1357 Age-adjusted D-dimer within normal range [JK]  1357 CBC with Differential(!) Hemoglobin decreased compared to previous. [JK]  8242 Basic metabolic panel(!) BUN slightly increased compared to previous [JK]  1357 Brain natriuretic peptide Normal [JK]  1357 Resp panel by RT-PCR (RSV, Flu A&B, Covid) Anterior Nasal Swab Negative [JK]  1521 he is feeling much better after treatment.  He is breathing more easily [JK]    Clinical Course User Index [JK] Dorie Rank, MD                             Medical Decision Making Notable wheezing on exam.  Differential diagnosis includes but not limited to COPD exacerbation, pneumonia, CHF, pulmonary embolism  Problems Addressed: Chronic obstructive pulmonary disease, unspecified COPD type ([place]): acute illness or injury that poses a threat to life or bodily functions  Amount and/or Complexity of Data Reviewed Labs: ordered. Decision-making details documented in ED Course.  Risk Prescription drug management.   Pt presents with persistent shortness of breath.  Known COPD.   Recent surgery, senting with concerns for possible pulmonary embolism.  On my exam patient had notable wheezing.  He was given breathing treatments.  With significant improvement.  Patient did have recent surgery so D-dimer was ordered.  It is 0.69 which is within the age-adjusted normal range.  I have low suspicion for PE at this point I do not feel that CT angiogram is necessary.  Patient felt much better  after DuoNeb treatment.  I will give him a prescription for that to use at home as well.  He is already on steroids.  Evaluation and diagnostic testing in the emergency department does not suggest an emergent  condition requiring admission or immediate intervention beyond what has been performed at this time.  The patient is safe for discharge and has been instructed to return immediately for worsening symptoms, change in symptoms or any other concerns.        Final Clinical Impression(s) / ED Diagnoses Final diagnoses:  Chronic obstructive pulmonary disease, unspecified COPD type ([place])    Rx / DC Orders ED Discharge Orders          Ordered    ipratropium-albuterol (DUONEB) 0.5-2.5 (3) MG/3ML SOLN  Every 4 hours PRN        03/29/22 1519              Dorie Rank, MD 03/29/22 1526

## 2022-03-29 NOTE — Telephone Encounter (Signed)
Spoke with pt's wife Delrae Rend who states pt has not gotten worse but nebulizer only provides about 30 mins of relief and pt is unable to lay down in bed without getting "chocked up". Pt reported small amounts of blood in mucus when he blew his nose as well. AVS instructions from OV with Roxan Diesel were reviewed with Alisa which states if nebulizer is not effective go to the emergency room. Alisa was instructed to follow that advice but also wanted to know if Joellen Jersey had any other advice as well. Katie please advise.

## 2022-04-01 ENCOUNTER — Ambulatory Visit (INDEPENDENT_AMBULATORY_CARE_PROVIDER_SITE_OTHER): Payer: PPO | Admitting: Podiatry

## 2022-04-01 ENCOUNTER — Ambulatory Visit: Payer: PRIVATE HEALTH INSURANCE | Admitting: Podiatry

## 2022-04-01 ENCOUNTER — Encounter: Payer: Self-pay | Admitting: Podiatry

## 2022-04-01 DIAGNOSIS — M79675 Pain in left toe(s): Secondary | ICD-10-CM

## 2022-04-01 DIAGNOSIS — E119 Type 2 diabetes mellitus without complications: Secondary | ICD-10-CM

## 2022-04-01 DIAGNOSIS — Z794 Long term (current) use of insulin: Secondary | ICD-10-CM | POA: Diagnosis not present

## 2022-04-01 DIAGNOSIS — M79674 Pain in right toe(s): Secondary | ICD-10-CM | POA: Diagnosis not present

## 2022-04-01 DIAGNOSIS — B351 Tinea unguium: Secondary | ICD-10-CM | POA: Diagnosis not present

## 2022-04-01 NOTE — Progress Notes (Signed)
This patient presents to the office with chief complaint of long thick painful nails.  Patient says the nails are painful walking and wearing shoes.  This patient is unable to self treat.  This patient is unable to trim his  nails since he is unable to reach his nails.  he presents to the office for preventative foot care services.  General Appearance  Alert, conversant and in no acute stress.  Vascular  Dorsalis pedis and posterior tibial  pulses are palpable  bilaterally.  Capillary return is within normal limits  bilaterally. Temperature is within normal limits  bilaterally.  Neurologic  Senn-Weinstein monofilament wire test diminished  bilaterally. Muscle power within normal limits bilaterally.  Nails Thick disfigured discolored nails with subungual debris  from hallux to fifth toes bilaterally. No evidence of bacterial infection or drainage bilaterally.  Orthopedic  No limitations of motion  feet .  No crepitus or effusions noted.  No bony pathology or digital deformities noted.  Skin  normotropic skin with no porokeratosis noted bilaterally.  No signs of infections or ulcers noted.     Onychomycosis  Nails  B/L.  Pain in right toes  Pain in left toes  Debridement of nails both feet followed trimming the nails with dremel tool.    RTC 3 months.   Gardiner Barefoot DPM

## 2022-04-02 DIAGNOSIS — I10 Essential (primary) hypertension: Secondary | ICD-10-CM | POA: Diagnosis not present

## 2022-04-02 DIAGNOSIS — E1142 Type 2 diabetes mellitus with diabetic polyneuropathy: Secondary | ICD-10-CM | POA: Diagnosis not present

## 2022-04-02 DIAGNOSIS — N528 Other male erectile dysfunction: Secondary | ICD-10-CM | POA: Diagnosis not present

## 2022-04-02 DIAGNOSIS — E782 Mixed hyperlipidemia: Secondary | ICD-10-CM | POA: Diagnosis not present

## 2022-04-02 DIAGNOSIS — F3341 Major depressive disorder, recurrent, in partial remission: Secondary | ICD-10-CM | POA: Diagnosis not present

## 2022-04-02 DIAGNOSIS — C679 Malignant neoplasm of bladder, unspecified: Secondary | ICD-10-CM | POA: Diagnosis not present

## 2022-04-02 DIAGNOSIS — M159 Polyosteoarthritis, unspecified: Secondary | ICD-10-CM | POA: Diagnosis not present

## 2022-04-02 DIAGNOSIS — Z0000 Encounter for general adult medical examination without abnormal findings: Secondary | ICD-10-CM | POA: Diagnosis not present

## 2022-04-02 DIAGNOSIS — J449 Chronic obstructive pulmonary disease, unspecified: Secondary | ICD-10-CM | POA: Diagnosis not present

## 2022-04-02 DIAGNOSIS — I700 Atherosclerosis of aorta: Secondary | ICD-10-CM | POA: Diagnosis not present

## 2022-04-02 DIAGNOSIS — F17200 Nicotine dependence, unspecified, uncomplicated: Secondary | ICD-10-CM | POA: Diagnosis not present

## 2022-04-04 ENCOUNTER — Encounter: Payer: Self-pay | Admitting: *Deleted

## 2022-04-05 ENCOUNTER — Ambulatory Visit: Payer: PPO | Admitting: Adult Health

## 2022-04-08 ENCOUNTER — Encounter: Payer: Self-pay | Admitting: Nurse Practitioner

## 2022-04-08 ENCOUNTER — Ambulatory Visit (INDEPENDENT_AMBULATORY_CARE_PROVIDER_SITE_OTHER): Payer: PPO | Admitting: Nurse Practitioner

## 2022-04-08 VITALS — BP 102/60 | HR 75 | Ht 70.5 in | Wt 269.6 lb

## 2022-04-08 DIAGNOSIS — J449 Chronic obstructive pulmonary disease, unspecified: Secondary | ICD-10-CM | POA: Diagnosis not present

## 2022-04-08 DIAGNOSIS — G4733 Obstructive sleep apnea (adult) (pediatric): Secondary | ICD-10-CM | POA: Diagnosis not present

## 2022-04-08 MED ORDER — BREZTRI AEROSPHERE 160-9-4.8 MCG/ACT IN AERO: 2.0000 | INHALATION_SPRAY | Freq: Two times a day (BID) | RESPIRATORY_TRACT | 28 refills | Status: DC

## 2022-04-08 NOTE — Assessment & Plan Note (Signed)
Mild OSA with AHI 7/h. Started on CPAP therapy in 2023. He has excellent compliance and continues to receive good benefit from use. Residual AHI 2.3/h and without any significant leaks. Cautioned on safe driving practices.

## 2022-04-08 NOTE — Progress Notes (Signed)
$'@Patient'i$  ID: Michael Horn, male    DOB: 04/03/1950, 72 y.o.   MRN: [id number]  Chief Complaint  Patient presents with   Follow-up    Pt f/u he reports he is feeling better, he is still doing his breathing treatments, and still has some lingering mucous. He is requesting Breztri samples (wife reports she brought PAP back to office).     Referring provider: Mayra Neer, MD  HPI: 72 year old male, active smoker (quit 03/18/2022) followed for COPD with chronic bronchitis and emphysema, and OSA on CPAP. He is followed by Dr. Shearon Stalls for COPD and Dr. Elsworth Soho for sleep. Past medical history significant for DM, obesity, HLD. He was recently diagnosed with bladder cancer.   TEST/EVENTS:  02/02/2021 PFT: FVC 82, FEV1 77, ratio 71, TLC 105, DLCOcor 89. No BD 04/2021 HST: AHI 7/h, supine 15/h; lowest SpO2 77% 07/09/2021 CPAP titration >> optimal pressure 15 cmH2O with residual AHI 1.4, SpO2 min at optimal pressure 89%  12/20/2021: OV with Dr. Shearon Stalls. Seen Dr. Elsworth Soho for OSA; started on CPAP. Feels sleep has improved. Excellent compliance with AHI <1/h. Not using Breztri consistently. Still smoking 1.5 ppd. Having wheezing, chest tightness. Coughing up clear phlegm. Asking if he needs oxygen. Instructed to resume Breztri inhaler for maintenance and albuterol for rescue. Needs referral to lung cancer screening program. Walk test with SpO2 low 92% on room air. No need for supplemental O2.   03/26/2022: OV with Brooks Stotz NP for acute visit. He was recently diagnosed with bladder cancer and underwent resection on 03/21/2022 with Dr. Diona Fanti. This past Saturday, 1/14, he started having fevers (t max 101.4), increased shortness of breath and a productive cough with yellow to brown sputum. He has not had any more fevers but continues to have trouble with his breathing and congestion. He feels like the only time he gets relief is when he uses his son's nebulizer treatments. They do leave him feeling very jittery though. He  denies any calf pain/swelling, hemoptysis, chills.   04/08/2022: Today - follow up Patient presents today with his wife for follow up. He had been treated for AECOPD at our last visit with steroids and antibiotics. He contacted the office three days later with persistent symptoms without any improvement and was advised to seek further evaluation in the ED given his post-operative status. He was seen 1/19; d dimer was nl when age adjusted. He was treated with neb and instructed to complete previously prescribed medications. Today, he tells me he is feeling much better. His breathing is back to his baseline and cough has resolved for the most part. He still has a minimally productive AM cough with clear sputum. He denies any wheezing, chills, fevers, hemoptysis. He is using his breztri inhaler twice daily. Still has not heard from patient assistance on this. He did pick smoking back up; around 1 ppd. Has had trouble affording pharmacological therapy for smoking cessation.  He is wearing his CPAP nightly. Sleeps well with this. Denies any residual daytime fatigue or snoring.   03/09/2022-04/07/2022: CPAP 10-15 cmH2O 30/30 days; 97% >4 hr; av use 7 hr 45 min Pressure 95th 14.2 Leaks 95th 9.2 AHI 2.3  Allergies  Allergen Reactions   Aripiprazole     Other reaction(s): Unknown   Contrast Media [Iodinated Contrast Media] Rash   Dulaglutide     Other reaction(s): Unknown   Rosuvastatin     Other reaction(s): Unknown    Immunization History  Administered Date(s) Administered   Fluad Quad(high  Dose 65+) 01/18/2021   Influenza Split 01/14/2008, 01/26/2009, 01/21/2012, 12/30/2012, 12/24/2016, 01/12/2018, 01/16/2021   Influenza, High Dose Seasonal PF 01/12/2018   Influenza,inj,Quad PF,6+ Mos 01/15/2011   Influenza,inj,quad, With Preservative 12/27/2013, 01/12/2018   Moderna Sars-Covid-2 Vaccination 04/02/2019, 05/03/2019, 02/11/2020   Pneumococcal Conjugate-13 08/04/2014   Pneumococcal  Polysaccharide-23 01/12/2007, 01/12/2016   Td 02/11/2004   Tdap 10/23/2009   Zoster, Live 06/04/2012, 03/23/2021    Past Medical History:  Diagnosis Date   Aortic atherosclerosis ([place])    Arthritis    Arthritis -DDD."chronic pain med used"   Atrial fibrillation ([place])    Bladder tumor    states that it was cancer   Cancer ([place])    Bladder cancer"-Dr.Ottelin- checks every 3 months--no signs last checks.   Chronic knee pain RIGHT KNEE --  S/P KNEE REPLACEMENT JAN 2012--  PT STATES  NEEDS ANOTHER REPLACEMENT DUE TO JOINT RECALL   Complication of anesthesia EMERGENCE DELIRIUM   occurred x1- 2 yrs ago.few surgeries ago, none recently   COPD (chronic obstructive pulmonary disease) ([place])    Coronary artery disease    Depression    Diverticulosis    DM type 2 (diabetes mellitus, type 2) (HCC)    novolog BID and oral agents   GERD (gastroesophageal reflux disease)    H/O hiatal hernia    Headache    History of bladder cancer TCC OF BLADDER  --  FOLLOWED BY DR Karsten Ro   History of peptic ulcer AGE 31   Hypertension    pt denies, takes lisimopril for kidneys   Internal hemorrhoids    Mild obstructive sleep apnea    CPAP   New onset a-fib ([place]) 02/28/2022   with RVR, during hospitalization, will use 30 day heart monitor s/p urology surgery   Nocturia    Peripheral neuropathy BOTTOM RIGHT FOOT   Seasonal allergies    Short of breath on exertion    Urgency of urination     Tobacco History: Social History   Tobacco Use  Smoking Status Some Days   Packs/day: 1.00   Years: 60.00   Total pack years: 60.00   Types: Cigarettes   Last attempt to quit: 03/18/2022   Years since quitting: 0.0  Smokeless Tobacco Never   Ready to quit: Not Answered Counseling given: Not Answered   Outpatient Medications Prior to Visit  Medication Sig Dispense Refill   albuterol (VENTOLIN HFA) 108 (90 Base) MCG/ACT inhaler Inhale 2 puffs into the lungs every 4 (four) hours as needed for wheezing or  shortness of breath. 1 each 5   aspirin EC 81 MG tablet Take 81 mg by mouth 4 (four) times a week. Swallow whole.     Budeson-Glycopyrrol-Formoterol (BREZTRI AEROSPHERE) 160-9-4.8 MCG/ACT AERO Inhale 2 puffs into the lungs in the morning and at bedtime. 10.7 g 5   diclofenac Sodium (VOLTAREN ARTHRITIS PAIN) 1 % GEL Apply 2 g topically 2 (two) times daily as needed (pain).     docusate sodium (COLACE) 100 MG capsule Take 100 mg by mouth daily as needed for mild constipation.     esomeprazole (NEXIUM) 40 MG capsule TAKE 1 CAPSULE BY MOUTH ONCE DAILY (APPOINTMENT  NEEDED  FOR  FURTHER  REFILLS) (Patient taking differently: Take 40 mg by mouth daily.) 30 capsule 2   ezetimibe (ZETIA) 10 MG tablet Take 10 mg by mouth at bedtime.     hyoscyamine (LEVSIN) 0.125 MG tablet Take 0.125 mg by mouth every 8 (eight) hours as needed for cramping.  insulin aspart protamine- aspart (NOVOLOG MIX 70/30) (70-30) 100 UNIT/ML injection Inject 55 Units into the skin 2 (two) times daily.     ipratropium-albuterol (DUONEB) 0.5-2.5 (3) MG/3ML SOLN Take 3 mLs by nebulization every 4 (four) hours as needed. 360 mL 1   levalbuterol (XOPENEX) 0.63 MG/3ML nebulizer solution Take 3 mLs (0.63 mg total) by nebulization every 6 (six) hours as needed for wheezing or shortness of breath. 75 mL 5   lisinopril (PRINIVIL,ZESTRIL) 10 MG tablet Take 10 mg by mouth every morning.     metFORMIN (GLUCOPHAGE) 1000 MG tablet Take 1,000 mg by mouth 2 (two) times daily.     Oxycodone HCl 10 MG TABS Take 10 mg by mouth every 6 (six) hours as needed (pain).     pravastatin (PRAVACHOL) 40 MG tablet Take 40 mg by mouth daily.     pregabalin (LYRICA) 150 MG capsule Take 150 mg by mouth 2 (two) times daily.     tamsulosin (FLOMAX) 0.4 MG CAPS capsule Take 0.4 mg by mouth every morning.     traZODone (DESYREL) 100 MG tablet Take 100 mg by mouth at bedtime.     trospium (SANCTURA) 20 MG tablet Take 20 mg by mouth 2 (two) times daily.     nicotine  (NICODERM CQ - DOSED IN MG/24 HOURS) 21 mg/24hr patch Place 1 patch (21 mg total) onto the skin daily. (Patient not taking: Reported on 04/08/2022) 28 patch 0   oxybutynin (DITROPAN) 5 MG tablet 1 p.o. every 6 hours as needed bladder spasms/bladder pain (Patient not taking: Reported on 03/26/2022) 15 tablet 1   senna-docusate (SENOKOT-S) 8.6-50 MG tablet Take 1 tablet by mouth 2 (two) times daily between meals as needed for moderate constipation. (Patient not taking: Reported on 04/08/2022)  0   predniSONE (DELTASONE) 10 MG tablet 4 tabs for 2 days, then 3 tabs for 2 days, 2 tabs for 2 days, then 1 tab for 2 days, then stop 20 tablet 0   sulfamethoxazole-trimethoprim (BACTRIM DS) 800-160 MG tablet Take 1 tablet by mouth 2 (two) times daily. (Patient not taking: Reported on 03/26/2022) 6 tablet 0   No facility-administered medications prior to visit.     Review of Systems:   Constitutional: No weight loss or gain, night sweats, chills, fatigue, lassitude, fevers  HEENT: No headaches, difficulty swallowing, tooth/dental problems, or sore throat. No sneezing, itching, ear ache, nasal congestion, or post nasal drip CV:  No chest pain, orthopnea, PND, swelling in lower extremities, anasarca, dizziness, palpitations, syncope Resp: +shortness of breath with exertion (baseline; improved); productive AM cough (improved). No chest congestion, wheezing. No hemoptysis. No chest wall deformity GI:  No heartburn, indigestion, abdominal pain, nausea, vomiting, diarrhea, change in bowel habits, loss of appetite, bloody stools.  GU: No dysuria, change in color of urine, urgency or frequency.   Skin: No rash, lesions, ulcerations MSK:  No joint pain or swelling.   Neuro: No dizziness or lightheadedness.  Psych: No depression or anxiety. Mood stable.     Physical Exam:  BP 102/60   Pulse 75   Ht 5' 10.5" (1.791 m)   Wt 269 lb 9.6 oz (122.3 kg)   SpO2 93%   BMI 38.14 kg/m   GEN: Pleasant, interactive,  well-appearing; in no acute distress HEENT:  Normocephalic and atraumatic. PERRLA. Sclera white. Nasal turbinates pink, moist and patent bilaterally. No rhinorrhea present. Oropharynx pink and moist, without exudate or edema. No lesions, ulcerations, or postnasal drip.  NECK:  Supple w/ fair  ROM. No JVD present. Normal carotid impulses w/o bruits. Thyroid symmetrical with no goiter or nodules palpated. No lymphadenopathy.   CV: RRR, no m/r/g, no peripheral edema. Pulses intact, +2 bilaterally. No cyanosis, pallor or clubbing. PULMONARY:  Unlabored, regular breathing. Clear bilaterally A&P w/o wheezes/rales/rhonchi. No accessory muscle use.  GI: BS present and normoactive. Soft, non-tender to palpation. No organomegaly or masses detected.  MSK: No erythema, warmth or tenderness. Cap refil <2 sec all extrem. No deformities or joint swelling noted.  Neuro: A/Ox3. No focal deficits noted.   Skin: Warm, no lesions or rashe Psych: Normal affect and behavior. Judgement and thought content appropriate.     Lab Results:  CBC    Component Value Date/Time   WBC 7.4 03/29/2022 1129   RBC 4.57 03/29/2022 1129   HGB 10.6 (L) 03/29/2022 1129   HCT 33.5 (L) 03/29/2022 1129   PLT 180 03/29/2022 1129   MCV 73.3 (L) 03/29/2022 1129   MCH 23.2 (L) 03/29/2022 1129   MCHC 31.6 03/29/2022 1129   RDW 15.9 (H) 03/29/2022 1129   LYMPHSABS 1.5 03/29/2022 1129   MONOABS 0.4 03/29/2022 1129   EOSABS 0.0 03/29/2022 1129   BASOSABS 0.0 03/29/2022 1129    BMET    Component Value Date/Time   NA 133 (L) 03/29/2022 1129   K 3.9 03/29/2022 1129   CL 101 03/29/2022 1129   CO2 19 (L) 03/29/2022 1129   GLUCOSE 239 (H) 03/29/2022 1129   BUN 26 (H) 03/29/2022 1129   CREATININE 1.01 03/29/2022 1129   CALCIUM 8.9 03/29/2022 1129   GFRNONAA >60 03/29/2022 1129   GFRAA >60 04/09/2018 0235    BNP    Component Value Date/Time   BNP 36.0 03/29/2022 1129     Imaging:  DG Chest 2 View  Result Date:  03/26/2022 CLINICAL DATA:  Recent surgery 03/21/2022. New onset of fevers. Productive cough. Increased dyspnea. EXAM: CHEST - 2 VIEW COMPARISON:  AP chest 02/28/2022, chest two views 02/21/2022 FINDINGS: Cardiac silhouette and mediastinal contours are within normal limits. There is again flattening of the diaphragms and mild hyperinflation. No pleural effusion or pneumothorax. Mild multilevel degenerative disc changes of the thoracic spine. 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 IMPRESSION: No active cardiopulmonary disease. Electronically Signed   By: Yvonne Kendall M.D.   On: 03/26/2022 11:35   Myocardial Perfusion Imaging  Result Date: 03/12/2022   The study is normal. The study is low risk.   No ST deviation was noted.   Left ventricular function is normal. Nuclear stress EF: 63 %. The left ventricular ejection fraction is normal (55-65%). End diastolic cavity size is normal. End systolic cavity size is normal.   Prior study not available for comparison. Negative stress test.  Low risk study.    methylPREDNISolone acetate (DEPO-MEDROL) injection 80 mg     Date Action Dose Route User   Discharged on 03/29/2022   Admitted on 03/29/2022   03/26/2022 1147 Given 80 mg Intramuscular (Left Ventrogluteal) Priscille Kluver, CMA      regadenoson (LEXISCAN) injection SOLN 0.4 mg     Date Action Dose Route User   Discharged on 03/29/2022   Admitted on 03/29/2022   Discharged on 03/21/2022   Admitted on 03/21/2022   03/12/2022 1145 Given 0.4 mg Intravenous Dunn, Olivia B, RT      technetium tetrofosmin (TC-MYOVIEW) injection 17.6 millicurie     Date Action Dose Route User   Discharged on 03/29/2022   Admitted on 03/29/2022  Discharged on 03/21/2022   Admitted on 03/21/2022   03/12/2022 1010 Contrast Given 10.2 millicurie Intravenous Dunn, Olivia B, RT      technetium tetrofosmin (TC-MYOVIEW) injection 58.5 millicurie     Date Action Dose Route User   Discharged on 03/29/2022    Admitted on 03/29/2022   Discharged on 03/21/2022   Admitted on 03/21/2022   03/12/2022 1145 Contrast Given 27.7 millicurie Intravenous Dunn, Olivia B, RT          Latest Ref Rng & Units 02/02/2021   10:51 AM  PFT Results  FVC-Pre L 3.54   FVC-Predicted Pre % 82   FVC-Post L 3.68   FVC-Predicted Post % 85   Pre FEV1/FVC % % 69   Post FEV1/FCV % % 71   FEV1-Pre L 2.43   FEV1-Predicted Pre % 77   FEV1-Post L 2.60   DLCO uncorrected ml/min/mmHg 22.86   DLCO UNC% % 90   DLCO corrected ml/min/mmHg 22.73   DLCO COR %Predicted % 89   DLVA Predicted % 90   TLC L 7.28   TLC % Predicted % 105   RV % Predicted % 156     No results found for: "NITRICOXIDE"      Assessment & Plan:   COPD, moderate (HCC) Moderate COPD with recent exacerbation. Clinically improved today. He will continue on triple therapy regimen and utilize neb treatments/SABA as needed. Counseled on smoking cessation and correlation between smoking and COPD. Referred to Quit Now line to see if they have any more affordable options.   Patient Instructions  Continue Breztri 2 puffs Twice daily. Brush tongue and rinse mouth afterwards Continue Albuterol inhaler 2 puffs or 3 mL neb every 6 hours as needed for shortness of breath or wheezing. Notify if symptoms persist despite rescue inhaler/neb use.  Continue CPAP nightly, minimum of 4-6 hours   Contact the 1-800-QUITNOW line to see about options for quitting smoking  Follow up in 4 months with Dr. Shearon Stalls. If symptoms worsen, please contact office for sooner follow up or seek emergency care.   OSA (obstructive sleep apnea) Mild OSA with AHI 7/h. Started on CPAP therapy in 2023. He has excellent compliance and continues to receive good benefit from use. Residual AHI 2.3/h and without any significant leaks. Cautioned on safe driving practices.   I spent 28 minutes of dedicated to the care of this patient on the date of this encounter to include pre-visit review of  records, face-to-face time with the patient discussing conditions above, post visit ordering of testing, clinical documentation with the electronic health record, making appropriate referrals as documented, and communicating necessary findings to members of the patients care team.  Clayton Bibles, NP 04/08/2022  Pt aware and understands NP's role.

## 2022-04-08 NOTE — Patient Instructions (Addendum)
Continue Breztri 2 puffs Twice daily. Brush tongue and rinse mouth afterwards Continue Albuterol inhaler 2 puffs or 3 mL neb every 6 hours as needed for shortness of breath or wheezing. Notify if symptoms persist despite rescue inhaler/neb use.  Continue CPAP nightly, minimum of 4-6 hours   Contact the 1-800-QUITNOW line to see about options for quitting smoking  Follow up in 4 months with Dr. Shearon Stalls. If symptoms worsen, please contact office for sooner follow up or seek emergency care.

## 2022-04-08 NOTE — Assessment & Plan Note (Signed)
Moderate COPD with recent exacerbation. Clinically improved today. He will continue on triple therapy regimen and utilize neb treatments/SABA as needed. Counseled on smoking cessation and correlation between smoking and COPD. Referred to Quit Now line to see if they have any more affordable options.   Patient Instructions  Continue Breztri 2 puffs Twice daily. Brush tongue and rinse mouth afterwards Continue Albuterol inhaler 2 puffs or 3 mL neb every 6 hours as needed for shortness of breath or wheezing. Notify if symptoms persist despite rescue inhaler/neb use.  Continue CPAP nightly, minimum of 4-6 hours   Contact the 1-800-QUITNOW line to see about options for quitting smoking  Follow up in 4 months with Dr. Shearon Stalls. If symptoms worsen, please contact office for sooner follow up or seek emergency care.

## 2022-04-10 DIAGNOSIS — E1165 Type 2 diabetes mellitus with hyperglycemia: Secondary | ICD-10-CM | POA: Diagnosis not present

## 2022-04-10 DIAGNOSIS — Z720 Tobacco use: Secondary | ICD-10-CM | POA: Diagnosis not present

## 2022-04-10 DIAGNOSIS — Z794 Long term (current) use of insulin: Secondary | ICD-10-CM | POA: Diagnosis not present

## 2022-04-10 DIAGNOSIS — I700 Atherosclerosis of aorta: Secondary | ICD-10-CM | POA: Diagnosis not present

## 2022-04-10 DIAGNOSIS — E1142 Type 2 diabetes mellitus with diabetic polyneuropathy: Secondary | ICD-10-CM | POA: Diagnosis not present

## 2022-04-10 DIAGNOSIS — Z92241 Personal history of systemic steroid therapy: Secondary | ICD-10-CM | POA: Diagnosis not present

## 2022-04-12 ENCOUNTER — Ambulatory Visit (INDEPENDENT_AMBULATORY_CARE_PROVIDER_SITE_OTHER): Payer: PPO | Admitting: Nurse Practitioner

## 2022-04-12 ENCOUNTER — Encounter: Payer: Self-pay | Admitting: Nurse Practitioner

## 2022-04-12 VITALS — BP 112/66 | HR 77 | Ht 70.5 in | Wt 270.0 lb

## 2022-04-12 DIAGNOSIS — Z86010 Personal history of colonic polyps: Secondary | ICD-10-CM | POA: Diagnosis not present

## 2022-04-12 DIAGNOSIS — C678 Malignant neoplasm of overlapping sites of bladder: Secondary | ICD-10-CM | POA: Diagnosis not present

## 2022-04-12 DIAGNOSIS — K5909 Other constipation: Secondary | ICD-10-CM

## 2022-04-12 DIAGNOSIS — N390 Urinary tract infection, site not specified: Secondary | ICD-10-CM | POA: Diagnosis not present

## 2022-04-12 NOTE — Patient Instructions (Addendum)
_______________________________________________________  If your blood pressure at your visit was 140/90 or greater, please contact your primary care physician to follow up on this.  _______________________________________________________  If you are age 74 or older, your body mass index should be between 23-30. Your Body mass index is 38.19 kg/m. If this is out of the aforementioned range listed, please consider follow up with your Primary Care Provider.  If you are age 14 or younger, your body mass index should be between 19-25. Your Body mass index is 38.19 kg/m. If this is out of the aformentioned range listed, please consider follow up with your Primary Care Provider.   ________________________________________________________  The [facility] providers would like to encourage you to use [facility] to communicate with providers for non-urgent requests or questions.  Due to long hold times on the telephone, sending your provider a message by [facility] may be a faster and more efficient way to get a response.  Please allow 48 business hours for a response.  Please remember that this is for non-urgent requests.  _______________________________________________________   Increase stools softeners to 1-2 every day. If not having at least 3 BMs a week then add 1 capful of miralax in 8 oz of water.  It has been recommended to you by your physician that you have a(n) COLONOSCOPY completed. Per your request, we did not schedule the procedure(s) today. Please contact our office at [phone] should you decide to have the procedure completed. You will be scheduled for a pre-visit and procedure WITH A TWO DAY PREP at that time.   It was a pleasure to see you today!  Thank you for trusting me with your gastrointestinal care!

## 2022-04-12 NOTE — Progress Notes (Unsigned)
Assessment    Patient profile:  Michael Horn is a 72 y.o. male known to Michael Horn with a past medical history of COPD, OSA, DM2, obesity, sigmoid colon resection for diverticular abscess, adenomatous colon polyps, recurrent bladder cancer . See PMH /PSH for additional history  # 72 yo male with history of colon polyps. Inadequate prep on last surveillance colonoscopy in Sept 2022. A one year repeat colonoscopy was recommended but not yet done.    # Chronic constipation.   # Chronic Deloit anemia ( years).   # Recurrent bladder cancer ( 6th recurrence), s/p  transurethral resection of bladder tumors  on 03/21/22.   Plan   Increase stools softeners to 1-2 capsules every day. If not having at least 3 BMs a week then add 1 capful of miralax in 8 oz of water daily. Will schedule for polyp surveillance colonoscopy. The risks and benefits of colonoscopy with possible polypectomy / biopsies were discussed and the patient agrees to proceed. Will give 2 day bowel prep.    HPI    Chief complaint: None. Schedule says dysphagia and heartburn and that this is a rescheduled appt from 12/15 ?   He isn't having any GERD symptoms as long as he take his PPI. No problems swallowing food as long as he cuts up food into small pieces.  He had had normal EGD with normal esophageal biopsies Sept 2022 and esophagus was empirically dilated at the time   I do see that he is due for a colonoscopy as he had a poor prep on the last one in Sept 2022 . Michael Horn tells me him that he stays constipated. He takes one stool softener a day. He thinks taking two a day may be more effective. Senokot is on his home med list but sounds like he doesn't take it. No blood in stools.    Previous GI Evaluation   Sept 2022 colonoscopy     Sept 2022 EGD for heartburn and dysphagia -normal exam. Esophagus was empirically dilated  Labs:     Latest Ref Rng & Units 03/29/2022   11:29 AM 03/21/2022   11:42 AM 03/01/2022     8:23 AM  CBC  WBC 4.0 - 10.5 K/uL 7.4   8.5   Hemoglobin 13.0 - 17.0 g/dL 10.6  11.9  11.6   Hematocrit 39.0 - 52.0 % 33.5  35.0  36.6   Platelets 150 - 400 K/uL 180   215        Latest Ref Rng & Units 03/01/2022    8:23 AM 02/28/2022    1:35 PM 05/04/2017   11:43 PM  Hepatic Function  Total Protein 6.5 - 8.1 g/dL  6.7  6.9   Albumin 3.5 - 5.0 g/dL 3.6  3.6  4.1   AST 15 - 41 U/L  42  24   ALT 0 - 44 U/L  39  22   Alk Phosphatase 38 - 126 U/L  62  70   Total Bilirubin 0.3 - 1.2 mg/dL  0.5  0.2    Lexiscan stress test 03/12/22   The study is normal. The study is low risk.   No ST deviation was noted.   Left ventricular function is normal. Nuclear stress EF: 63 %. The left ventricular ejection fraction is normal (55-65%). End diastolic cavity size is normal. End systolic cavity size is normal.   Prior study not available for comparison.  Negative stress test.  Low risk study.  Past  Medical History:  Diagnosis Date   Aortic atherosclerosis ([place])    Arthritis    Arthritis -DDD."chronic pain med used"   Atrial fibrillation ()    Bladder tumor    states that it was cancer   Cancer [facility])    Bladder cancer"-Michael Horn- checks every 3 months--no signs last checks.   Chronic knee pain RIGHT KNEE --  S/P KNEE REPLACEMENT JAN 2012--  PT STATES  NEEDS ANOTHER REPLACEMENT DUE TO JOINT RECALL   Complication of anesthesia EMERGENCE DELIRIUM   occurred x1- 2 yrs ago.few surgeries ago, none recently   COPD (chronic obstructive pulmonary disease) ([place])    Coronary artery disease    Depression    Diverticulosis    DM type 2 (diabetes mellitus, type 2) ([facility])    novolog BID and oral agents   GERD (gastroesophageal reflux disease)    H/O hiatal hernia    Headache    History of bladder cancer TCC OF BLADDER  --  FOLLOWED BY Michael Horn   History of peptic ulcer AGE 73   Hypertension    pt denies, takes lisimopril for kidneys   Internal hemorrhoids    Mild obstructive sleep apnea     CPAP   New onset a-fib ([place]) 02/28/2022   with RVR, during hospitalization, will use 30 day heart monitor s/p urology surgery   Nocturia    Peripheral neuropathy BOTTOM RIGHT FOOT   Seasonal allergies    Short of breath on exertion    Urgency of urination     Past Surgical History:  Procedure Laterality Date   BACK SURGERY  X2   lower back   BILATERAL KNEE ARTHROSCOPY     BILATERAL SHOULDER SURG.  2003   CARDIAC CATHETERIZATION  04-13-2007   ---- Michael Horn   NO SIGNIFICANT CAD/ NORMAL LVF   CARPAL TUNNEL RELEASE  04/11/2006   RIGHT   COLONOSCOPY WITH PROPOFOL N/A 05/01/2015   Procedure: COLONOSCOPY WITH PROPOFOL;  Surgeon: Michael Fair, MD;  Location: [facility];  Service: Endoscopy;  Laterality: N/A;   CYSTOSCOPY W/ RETROGRADES Bilateral 12/23/2019   Procedure: CYSTOSCOPY WITH RETROGRADE PYELOGRAM;  Surgeon: Michael Gallo, MD;  Location: [facility];  Service: Urology;  Laterality: Bilateral;   HAND SURGERY Right    cyst removed   LEFT CARPAL. TUNNEL RELEASE     LEFT EYE SURG.   2008   REMOVAL OF BB   REPAIR RECURRENT LEFT ROTATOR CUFF TEAR  01/02/2011   REVERSE SHOULDER ARTHROPLASTY Left 04/08/2018   Procedure: REVERSE SHOULDER ARTHROPLASTY;  Surgeon: Michael Stairs, MD;  Location: [facility];  Service: Orthopedics;  Laterality: Left;  2.5 hours   SIGMOID COLECTOMY  09/29/2001   DIVERTICULITIS   TOTAL KNEE ARTHROPLASTY  04/03/2010   RIGHT   TRANSURETHRAL RESECTION OF BLADDER TUMOR  07/06/2010   TRANSURETHRAL RESECTION OF BLADDER TUMOR  05/20/2011   Procedure: TRANSURETHRAL RESECTION OF BLADDER TUMOR (TURBT);  Surgeon: Michael Jabs, MD;  Location: [facility];  Service: Urology;  Laterality: N/A;  GYRUS INSTILL MYTOMYCIN C NO BED   TRANSURETHRAL RESECTION OF BLADDER TUMOR  01/31/2012   Procedure: TRANSURETHRAL RESECTION OF BLADDER TUMOR (TURBT);  Surgeon: Michael Jabs, MD;  Location: [facility];  Service:  Urology;  Laterality: N/A;   TRANSURETHRAL RESECTION OF BLADDER TUMOR WITH GYRUS (TURBT-GYRUS) N/A 11/22/2013   Procedure: Bladder biopsy with fulgeration;  Surgeon: Michael Jabs, MD;  Location: [facility];  Service: Urology;  Laterality:  N/A;   TRANSURETHRAL RESECTION OF BLADDER TUMOR WITH MITOMYCIN-C N/A 12/23/2019   Procedure: TRANSURETHRAL RESECTION OF BLADDER TUMOR WITH POST-OP  GEMCITABINE;  Surgeon: Michael Gallo, MD;  Location: [facility];  Service: Urology;  Laterality: N/A;  [place] TUMOR WITH MITOMYCIN-C N/A 03/21/2022   Procedure: TRANSURETHRAL RESECTION OF BLADDER TUMOR WITH GEMCITABINE;  Surgeon: Michael Gallo, MD;  Location: [facility];  Service: Urology;  Laterality: N/A;    Current Medications, Allergies, Family History and Social History were reviewed in [employer] record.     Current Outpatient Medications  Medication Sig Dispense Refill   albuterol (VENTOLIN HFA) 108 (90 Base) MCG/ACT inhaler Inhale 2 puffs into the lungs every 4 (four) hours as needed for wheezing or shortness of breath. 1 each 5   aspirin EC 81 MG tablet Take 81 mg by mouth 4 (four) times a week. Swallow whole.     Budeson-Glycopyrrol-Formoterol (BREZTRI AEROSPHERE) 160-9-4.8 MCG/ACT AERO Inhale 2 puffs into the lungs in the morning and at bedtime. 10.7 g 5   Budeson-Glycopyrrol-Formoterol (BREZTRI AEROSPHERE) 160-9-4.8 MCG/ACT AERO Inhale 2 puffs into the lungs in the morning and at bedtime. 10.7 g 28   diclofenac Sodium (VOLTAREN ARTHRITIS PAIN) 1 % GEL Apply 2 g topically 2 (two) times daily as needed (pain).     docusate sodium (COLACE) 100 MG capsule Take 100 mg by mouth daily as needed for mild constipation.     esomeprazole (NEXIUM) 40 MG capsule TAKE 1 CAPSULE BY MOUTH ONCE DAILY (APPOINTMENT  NEEDED  FOR  FURTHER  REFILLS) (Patient taking differently: Take 40 mg by mouth daily.) 30  capsule 2   ezetimibe (ZETIA) 10 MG tablet Take 10 mg by mouth at bedtime.     hyoscyamine (LEVSIN) 0.125 MG tablet Take 0.125 mg by mouth every 8 (eight) hours as needed for cramping.     insulin aspart protamine- aspart (NOVOLOG MIX 70/30) (70-30) 100 UNIT/ML injection Inject 55 Units into the skin 2 (two) times daily.     ipratropium-albuterol (DUONEB) 0.5-2.5 (3) MG/3ML SOLN Take 3 mLs by nebulization every 4 (four) hours as needed. 360 mL 1   levalbuterol (XOPENEX) 0.63 MG/3ML nebulizer solution Take 3 mLs (0.63 mg total) by nebulization every 6 (six) hours as needed for wheezing or shortness of breath. 75 mL 5   lisinopril (PRINIVIL,ZESTRIL) 10 MG tablet Take 10 mg by mouth every morning.     metFORMIN (GLUCOPHAGE) 1000 MG tablet Take 1,000 mg by mouth 2 (two) times daily.     oxybutynin (DITROPAN) 5 MG tablet 1 p.o. every 6 hours as needed bladder spasms/bladder pain 15 tablet 1   Oxycodone HCl 10 MG TABS Take 10 mg by mouth every 6 (six) hours as needed (pain).     pravastatin (PRAVACHOL) 40 MG tablet Take 40 mg by mouth daily.     pregabalin (LYRICA) 150 MG capsule Take 150 mg by mouth 2 (two) times daily.     senna-docusate (SENOKOT-S) 8.6-50 MG tablet Take 1 tablet by mouth 2 (two) times daily between meals as needed for moderate constipation.  0   tamsulosin (FLOMAX) 0.4 MG CAPS capsule Take 0.4 mg by mouth every morning.     traZODone (DESYREL) 100 MG tablet Take 100 mg by mouth at bedtime.     trospium (SANCTURA) 20 MG tablet Take 20 mg by mouth 2 (two) times daily.     No current facility-administered medications for this visit.  Review of Systems: No chest pain. No shortness of breath. No urinary complaints.    Physical Exam  Wt Readings from Last 3 Encounters:  04/12/22 270 lb (122.5 kg)  04/08/22 269 lb 9.6 oz (122.3 kg)  03/26/22 272 lb 12.8 oz (123.7 kg)    BP 112/66   Pulse 77   Ht 5' 10.5" (1.791 m)   Wt 270 lb (122.5 kg)   SpO2 95%   BMI 38.19 kg/m   Constitutional:  Obese male in no acute distress. Psychiatric: Normal mood and affect. Behavior is normal. EENT: Pupils normal.  Conjunctivae are normal. No scleral icterus. Neck supple.  Cardiovascular: Normal rate, regular rhythm.  Pulmonary/chest: Effort normal and breath sounds normal. No wheezing, rales or rhonchi. Abdominal: Soft, nondistended, nontender. Bowel sounds active throughout. There are no masses palpable.  Neurological: Alert and oriented to person place and time. Skin: Skin is warm and dry. No rashes noted.  Tye Savoy, NP  04/12/2022, 2:20 PM

## 2022-04-15 ENCOUNTER — Encounter: Payer: Self-pay | Admitting: Nurse Practitioner

## 2022-04-15 NOTE — Progress Notes (Signed)
Agree with assessment and plan as outlined.  

## 2022-04-23 DIAGNOSIS — J449 Chronic obstructive pulmonary disease, unspecified: Secondary | ICD-10-CM | POA: Diagnosis not present

## 2022-04-30 DIAGNOSIS — M47812 Spondylosis without myelopathy or radiculopathy, cervical region: Secondary | ICD-10-CM | POA: Diagnosis not present

## 2022-05-01 ENCOUNTER — Telehealth: Payer: Self-pay | Admitting: Podiatry

## 2022-05-01 NOTE — Telephone Encounter (Signed)
Faxed paperwork for prior auth to [facility]

## 2022-05-09 ENCOUNTER — Ambulatory Visit: Payer: PPO

## 2022-05-09 ENCOUNTER — Other Ambulatory Visit: Payer: Self-pay

## 2022-05-09 ENCOUNTER — Telehealth: Payer: Self-pay | Admitting: Nurse Practitioner

## 2022-05-09 DIAGNOSIS — M25561 Pain in right knee: Secondary | ICD-10-CM | POA: Diagnosis not present

## 2022-05-09 DIAGNOSIS — Z79891 Long term (current) use of opiate analgesic: Secondary | ICD-10-CM | POA: Diagnosis not present

## 2022-05-09 DIAGNOSIS — M47817 Spondylosis without myelopathy or radiculopathy, lumbosacral region: Secondary | ICD-10-CM | POA: Diagnosis not present

## 2022-05-09 DIAGNOSIS — G894 Chronic pain syndrome: Secondary | ICD-10-CM | POA: Diagnosis not present

## 2022-05-09 DIAGNOSIS — M62830 Muscle spasm of back: Secondary | ICD-10-CM | POA: Diagnosis not present

## 2022-05-09 MED ORDER — ESOMEPRAZOLE MAGNESIUM 40 MG PO CPDR: 40.0000 mg | DELAYED_RELEASE_CAPSULE | Freq: Every day | ORAL | 2 refills | Status: DC

## 2022-05-09 NOTE — Telephone Encounter (Signed)
PT is calling to get a refill for esomperazole. He was seen in office 2/2. He is asking can it be a 90 day supply so he wont have to keep asking for refills. Please advise.

## 2022-05-09 NOTE — Progress Notes (Signed)
Patient presents today to pick up diabetic shoes and insoles.  Patient was dispensed 1 pair of diabetic shoes and 3 pairs of foam casted diabetic insoles.   He tried on the shoes with the insoles and the fit was too big.  Will  order [id number] in 10XW.

## 2022-05-12 NOTE — Addendum Note (Signed)
Addended by: Edrick Kins on: 05/12/2022 11:45 AM   Modules accepted: Orders

## 2022-05-14 NOTE — Telephone Encounter (Signed)
Received prior auth for diabetic shoes auth #  [id number] -sending to scan center to be entered into chart

## 2022-05-15 NOTE — ED Provider Notes (Signed)
[facility] Provider Note   CSN: [id number] Arrival date & time: 02/28/22  1314     History  Chief Complaint  Patient presents with   Altered Mental Status   Arm Pain   Dizziness   Visual Field Change    Michael Horn is a 72 y.o. male with PMH of COPD, CAD, tobacco use disorder, IDDM-2, OSA on CPAP, osteoarthritis, BPH, bladder cancer, morbid obesity and chronic pain on opiates presenting with altered mental status, neck pain, dizziness and transient blurry vision.    Patient p/w sharp neck pain radiating to his left upper back, dizziness and binocular blurry vision about 11 PM last night that lasted 15 to 20 minutes.  He had return of his symptoms about 11:30 AM this morning that prompted him to come to ED for evaluation.  Patient was recently diagnosed with pneumonia and treated with antibiotics.  He reports productive cough but denies chest pain or shortness of breath.  However, he has chronic dyspnea with exertion.  He also reports headache that has resolved.  He denies current focal weakness but had left upper arm numbness that has resolved as well.  He admits to runny nose and some nasal congestion but denies sore throat.  He denies nausea, vomiting or abdominal pain.  He denies UTI symptoms.  He denies new medication.    Altered Mental Status Arm Pain  Dizziness      Home Medications Prior to Admission medications   Medication Sig Start Date End Date Taking? Authorizing Provider  albuterol (VENTOLIN HFA) 108 (90 Base) MCG/ACT inhaler Inhale 2 puffs into the lungs every 4 (four) hours as needed for wheezing or shortness of breath. 01/02/21  Yes Spero Geralds, MD  aspirin EC 81 MG tablet Take 81 mg by mouth 4 (four) times a week. Swallow whole.   Yes [provider]  Budeson-Glycopyrrol-Formoterol (BREZTRI AEROSPHERE) 160-9-4.8 MCG/ACT AERO Inhale 2 puffs into the lungs in the morning and at bedtime. 01/29/22  Yes Spero Geralds, MD  diclofenac Sodium (VOLTAREN ARTHRITIS PAIN) 1 % GEL Apply 2 g topically 2 (two) times daily as needed (pain).   Yes [provider]  docusate sodium (COLACE) 100 MG capsule Take 100 mg by mouth daily as needed for mild constipation.   Yes [provider]  ezetimibe (ZETIA) 10 MG tablet Take 10 mg by mouth at bedtime.   Yes [provider]  hyoscyamine (LEVSIN) 0.125 MG tablet Take 0.125 mg by mouth every 8 (eight) hours as needed for cramping.   Yes [provider]  insulin aspart protamine- aspart (NOVOLOG MIX 70/30) (70-30) 100 UNIT/ML injection Inject 55 Units into the skin 2 (two) times daily.   Yes [provider]  lisinopril (PRINIVIL,ZESTRIL) 10 MG tablet Take 10 mg by mouth every morning.   Yes [provider]  metFORMIN (GLUCOPHAGE) 1000 MG tablet Take 1,000 mg by mouth 2 (two) times daily.   Yes [provider]  Oxycodone HCl 10 MG TABS Take 10 mg by mouth every 6 (six) hours as needed (pain). 08/05/21  Yes [provider]  pravastatin (PRAVACHOL) 40 MG tablet Take 40 mg by mouth daily.   Yes [provider]  pregabalin (LYRICA) 150 MG capsule Take 150 mg by mouth 2 (two) times daily. 10/30/20  Yes [provider]  senna-docusate (SENOKOT-S) 8.6-50 MG tablet Take 1 tablet by mouth 2 (two) times daily between meals as needed for moderate constipation.  03/01/22  Yes Mercy Riding, MD  tamsulosin (FLOMAX) 0.4 MG CAPS capsule Take 0.4 mg by mouth every morning.   Yes [provider]  traZODone (DESYREL) 100 MG tablet Take 100 mg by mouth at bedtime.   Yes [provider]  trospium (SANCTURA) 20 MG tablet Take 20 mg by mouth 2 (two) times daily.   Yes [provider]  Budeson-Glycopyrrol-Formoterol (BREZTRI AEROSPHERE) 160-9-4.8 MCG/ACT AERO Inhale 2 puffs into the lungs in the morning and at bedtime. 04/08/22   Cobb, Karie Schwalbe, NP  esomeprazole (NEXIUM) 40 MG capsule  Take 1 capsule (40 mg total) by mouth daily before breakfast. 05/09/22   Willia Craze, NP  ipratropium-albuterol (DUONEB) 0.5-2.5 (3) MG/3ML SOLN Take 3 mLs by nebulization every 4 (four) hours as needed. 03/29/22   Dorie Rank, MD  levalbuterol Penne Lash) 0.63 MG/3ML nebulizer solution Take 3 mLs (0.63 mg total) by nebulization every 6 (six) hours as needed for wheezing or shortness of breath. 03/26/22   Cobb, Karie Schwalbe, NP  oxybutynin (DITROPAN) 5 MG tablet 1 p.o. every 6 hours as needed bladder spasms/bladder pain 03/21/22   Franchot Gallo, MD      Allergies    Aripiprazole, Contrast media [iodinated contrast media], Dulaglutide, and Rosuvastatin    Review of Systems   Review of Systems  Neurological:  Positive for dizziness.   Review of systems Negative for f/c.  A 10 point review of systems was performed and is negative unless otherwise reported in HPI.  Physical Exam Updated Vital Signs BP (!) 152/77 (BP Location: Right Arm)   Pulse 77   Temp 98.3 F (36.8 C) (Oral)   Resp (!) 24   Ht '5\' 10"'$  (1.778 m)   Wt 123 kg   SpO2 96%   BMI 38.91 kg/m  Physical Exam General: Normal appearing male, lying in bed.  HEENT: PERRLA, EOMI, no nystagmus, Sclera anicteric, MMM, trachea midline. Tongue protrudes midline. Cardiology: tachycardic irregular rate/rhythm, no murmurs/rubs/gallops. BL radial and DP pulses equal bilaterally.  Resp: Normal respiratory rate and effort. CTAB, no wheezes, rhonchi, crackles.  Abd: Soft, non-tender, non-distended. No rebound tenderness or guarding.  GU: Deferred. MSK: No peripheral edema or signs of trauma. Extremities without deformity or TTP. No cyanosis or clubbing. Skin: warm, dry. No rashes or lesions. Back: No CVA tenderness Neuro: A&Ox4, CNs II-XII grossly intact. MAEs. Sensation grossly intact. Normal speech. Normal coordination. Psych: Normal mood and affect.   ED Results / Procedures / Treatments   Labs (all labs ordered are listed, but  only abnormal results are displayed) Labs Reviewed  URINE CULTURE - Abnormal; Notable for the following components:      Result Value   Culture MULTIPLE SPECIES PRESENT, SUGGEST RECOLLECTION (*)    All other components within normal limits  COMPREHENSIVE METABOLIC PANEL - Abnormal; Notable for the following components:   BUN 26 (*)    Calcium 8.8 (*)    AST 42 (*)    All other components within normal limits  CBC - Abnormal; Notable for the following components:   WBC 14.8 (*)    Hemoglobin 12.7 (*)    MCV 74.0 (*)    MCH 23.8 (*)    RDW 16.0 (*)    All other components within normal limits  DIFFERENTIAL - Abnormal; Notable for the following components:   Neutro Abs 9.7 (*)    Abs Immature Granulocytes 0.14 (*)    All other components within normal limits  RAPID URINE DRUG SCREEN, HOSP  PERFORMED - Abnormal; Notable for the following components:   Opiates POSITIVE (*)    All other components within normal limits  LACTIC ACID, PLASMA - Abnormal; Notable for the following components:   Lactic Acid, Venous 2.1 (*)    All other components within normal limits  BLOOD GAS, VENOUS - Abnormal; Notable for the following components:   pCO2, Ven 43 (*)    Acid-Base Excess 2.2 (*)    All other components within normal limits  RENAL FUNCTION PANEL - Abnormal; Notable for the following components:   Glucose, Bld 243 (*)    All other components within normal limits  CBC - Abnormal; Notable for the following components:   Hemoglobin 11.6 (*)    HCT 36.6 (*)    MCV 74.1 (*)    MCH 23.5 (*)    RDW 16.0 (*)    All other components within normal limits  I-STAT CHEM 8, ED - Abnormal; Notable for the following components:   Calcium, Ion 1.14 (*)    Hemoglobin 12.9 (*)    HCT 38.0 (*)    All other components within normal limits  RESP PANEL BY RT-PCR (RSV, FLU A&B, COVID)  RVPGX2  CULTURE, BLOOD (ROUTINE X 2)  CULTURE, BLOOD (ROUTINE X 2)  RESPIRATORY PANEL BY PCR  ETHANOL  PROTIME-INR   APTT  URINALYSIS, ROUTINE W REFLEX MICROSCOPIC  TSH  MAGNESIUM  LACTIC ACID, PLASMA  CK  AMMONIA  MAGNESIUM  CBG MONITORING, ED  TROPONIN I (HIGH SENSITIVITY)  TROPONIN I (HIGH SENSITIVITY)    EKG EKG Interpretation  Date/Time:  Thursday February 28 2022 13:27:47 EST Ventricular Rate:  120 PR Interval:    QRS Duration: 99 QT Interval:  314 QTC Calculation: 444 R Axis:   70 Text Interpretation: Atrial fibrillation with RVR Ventricular premature complex Minimal ST depression, inferior leads Confirmed by Cindee Lame [phone]) on 02/28/2022 2:37:21 PM  Radiology Pending  Procedures .Critical Care  Performed by: Audley Hose, MD Authorized by: Audley Hose, MD   Critical care provider statement:    Critical care time (minutes):  30   Critical care was necessary to treat or prevent imminent or life-threatening deterioration of the following conditions:  Circulatory failure and sepsis   Critical care was time spent personally by me on the following activities:  Development of treatment plan with patient or surrogate, discussions with consultants, evaluation of patient's response to treatment, examination of patient, ordering and review of laboratory studies, ordering and review of radiographic studies, ordering and performing treatments and interventions, pulse oximetry, re-evaluation of patient's condition and review of old charts     Medications Ordered in ED Medications  prochlorperazine (COMPAZINE) injection 10 mg (10 mg Intravenous Given 02/28/22 1546)  lactated ringers bolus 1,000 mL (0 mLs Intravenous Stopped 02/28/22 1639)  diphenhydrAMINE (BENADRYL) injection 12.5 mg (12.5 mg Intravenous Given 02/28/22 1546)  ceFEPIme (MAXIPIME) 2 g in sodium chloride 0.9 % 100 mL IVPB (0 g Intravenous Stopped 02/28/22 1640)  metroNIDAZOLE (FLAGYL) IVPB 500 mg (0 mg Intravenous Stopped 02/28/22 1706)  vancomycin (VANCOREADY) IVPB 2000 mg/400 mL (0 mg Intravenous Stopped  02/28/22 1924)  lactated ringers bolus 1,000 mL (0 mLs Intravenous Stopped 02/28/22 1912)    ED Course/ Medical Decision Making/ A&P                          Medical Decision Making Amount and/or Complexity of Data Reviewed Labs: ordered. Decision-making details documented in ED Course.  Radiology: ordered.  Risk Prescription drug management. Decision regarding hospitalization.    This patient presents to the ED for concern of transient confusion, SIRS+, this involves an extensive number of treatment options, and is a complaint that carries with it a high risk of complications and morbidity.  I considered the following differential and admission for this acute, potentially life threatening condition. He is not hypoxic, no resp distress.  MDM:    Patient is initially tachycardic w/ Afib w/ RVR at rate of 120 bpm, and WBC found to be 14.8. After patient was roomed, he had a blood pressure 86/47 that responded to fluid resuscitation. Activated code sepsis and will give broad spectrum abx d/t meeting criteria for sepsis and possible shock, given report of productive cough consider respiratory source such as pneumonia a possibility and was recently diagnosed with this, could have failure of o/p treatment. Also consider UTI, no abdominal pain to raise c/f intraabdominal source. He did report episode of confusion and arm numbness, however he has no nuchal rigidity or meningeal signs to raise concern for meningitis. Other differentials for shock include hypovolemic shock vs cardiogenic shock especially given Afib w/ RVR, however patient's BP responded well to fluids and do not believe he requires an emergent cardioversion at this time. He has no h/o Afib w/ RVR and does not take a blood thinner, unclear exactly when his Afib started, do not believe he is a good candidate for cardioversion currently. And in fact, with fluids, patient converts to normal sinus rhythm. Lower c/f obstructive shock with low  c/f PE or cardiac tamponade, no c/f anaphylactic shock.  For episode of confusion that resolved, no report of head trauma and no FNDs but will obtain CTH to r/o ICH, hydrocephalus. Consider metabolic encephalopathy d/t possible sepsis. Also could represent TIA given associated left arm numbness that has now resolved. No seizure like activity reported.  Clinical Course as of 05/15/22 1905  Thu Feb 28, 2022  1449 WBC(!): 14.8 [HN]  1450 Hemoglobin(!): 12.7 [HN]  1450 Urinalysis, Routine w reflex microscopic Urine, Clean Catch Neg for UTI [HN]  1657 Lactic acid level is elevated.  Will give additional fluid bolus. [id number] Head CT without acute findings.  Chest x-ray not suggestive of pneumonia.  Urinalysis does not show UTI [JK]    Clinical Course User Index [HN] Audley Hose, MD [JK] Dorie Rank, MD    Labs: I Ordered, and personally interpreted labs.  The pertinent results include:  those listed above  Imaging Studies ordered: I ordered imaging studies including head CT, CXR Pending at time of signout  Additional history obtained from chart review, wife at bedside.    Cardiac Monitoring: The patient was maintained on a cardiac monitor.  I personally viewed and interpreted the cardiac monitored which showed an underlying rhythm of: sinus tachycardia  Social Determinants of Health: Patient lives independently   Disposition:  Patient is signed out to the oncoming ED physician who is made aware of his history, presentation, exam, workup, and plan.    Co morbidities that complicate the patient evaluation  Past Medical History:  Diagnosis Date   Aortic atherosclerosis (HCC)    Arthritis    Arthritis -DDD."chronic pain med used"   Atrial fibrillation ([place])    Bladder tumor    states that it was cancer   Cancer (HCC)    Bladder cancer"-Dr.Ottelin- checks every 3 months--no signs last checks.   Chronic knee pain RIGHT KNEE --  S/P KNEE REPLACEMENT  JAN 2012--  PT STATES   NEEDS ANOTHER REPLACEMENT DUE TO JOINT RECALL   Complication of anesthesia EMERGENCE DELIRIUM   occurred x1- 2 yrs ago.few surgeries ago, none recently   COPD (chronic obstructive pulmonary disease) ([place])    Coronary artery disease    Depression    Diverticulosis    DM type 2 (diabetes mellitus, type 2) (HCC)    novolog BID and oral agents   GERD (gastroesophageal reflux disease)    H/O hiatal hernia    Headache    History of bladder cancer [facility] OF BLADDER  --  FOLLOWED BY DR Karsten Ro   History of peptic ulcer AGE 46   Hypertension    pt denies, takes lisimopril for kidneys   Internal hemorrhoids    Mild obstructive sleep apnea    CPAP   New onset a-fib ([place]) 02/28/2022   with RVR, during hospitalization, will use 30 day heart monitor s/p urology surgery   Nocturia    Peripheral neuropathy BOTTOM RIGHT FOOT   Seasonal allergies    Short of breath on exertion    Urgency of urination      Medicines Meds ordered this encounter  Medications   prochlorperazine (COMPAZINE) injection 10 mg   lactated ringers bolus 1,000 mL   diphenhydrAMINE (BENADRYL) injection 12.5 mg   DISCONTD: acetaminophen (TYLENOL) tablet 1,000 mg   ceFEPIme (MAXIPIME) 2 g in sodium chloride 0.9 % 100 mL IVPB    Order Specific Question:   Antibiotic Indication:    Answer:   Other Indication (list below)    Order Specific Question:   Other Indication:    Answer:   Unknown source   metroNIDAZOLE (FLAGYL) IVPB 500 mg    Order Specific Question:   Antibiotic Indication:    Answer:   Other Indication (list below)    Order Specific Question:   Other Indication:    Answer:   Unknown source   DISCONTD: vancomycin (VANCOCIN) IVPB 1000 mg/200 mL premix    Order Specific Question:   Indication:    Answer:   Other Indication (list below)    Order Specific Question:   Other Indication:    Answer:   Unknown source   vancomycin (VANCOREADY) IVPB 2000 mg/400 mL    Order Specific Question:   Indication:    Answer:    Other Indication (list below)    Order Specific Question:   Other Indication:    Answer:   Unknown source   lactated ringers bolus 1,000 mL   DISCONTD: aspirin EC tablet 81 mg    Swallow whole.     DISCONTD: oxyCODONE (Oxy IR/ROXICODONE) immediate release tablet 5 mg   DISCONTD: pantoprazole (PROTONIX) EC tablet 40 mg   DISCONTD: tamsulosin (FLOMAX) capsule 0.4 mg   DISCONTD: umeclidinium bromide (INCRUSE ELLIPTA) 62.5 MCG/ACT 1 puff   DISCONTD: mometasone-formoterol (DULERA) 200-5 MCG/ACT inhaler 2 puff   DISCONTD: ipratropium-albuterol (DUONEB) 0.5-2.5 (3) MG/3ML nebulizer solution 3 mL   DISCONTD: nicotine (NICODERM CQ - dosed in mg/24 hours) patch 21 mg   DISCONTD: enoxaparin (LOVENOX) injection 40 mg   DISCONTD: acetaminophen (TYLENOL) tablet 650 mg   DISCONTD: acetaminophen (TYLENOL) suppository 650 mg   DISCONTD: ondansetron (ZOFRAN) tablet 4 mg   DISCONTD: ondansetron (ZOFRAN) injection 4 mg   DISCONTD: senna-docusate (Senokot-S) tablet 1 tablet   DISCONTD: polyethylene glycol (MIRALAX / GLYCOLAX) packet 17 g   DISCONTD: umeclidinium bromide (INCRUSE ELLIPTA) 62.5 MCG/ACT 1 puff   DISCONTD: lactated ringers infusion   DISCONTD: vancomycin (  VANCOREADY) IVPB 1500 mg/300 mL    Order Specific Question:   Indication:    Answer:   Sepsis   DISCONTD: ceFEPIme (MAXIPIME) 2 g in sodium chloride 0.9 % 100 mL IVPB    Order Specific Question:   Antibiotic Indication:    Answer:   Sepsis   DISCONTD: melatonin tablet 10 mg   DISCONTD: lisinopril (ZESTRIL) tablet 10 mg   DISCONTD: ezetimibe (ZETIA) tablet 10 mg   DISCONTD: pravastatin (PRAVACHOL) tablet 40 mg   DISCONTD: traZODone (DESYREL) tablet 100 mg   DISCONTD: fesoterodine (TOVIAZ) tablet 4 mg   DISCONTD: pregabalin (LYRICA) capsule 100 mg   senna-docusate (SENOKOT-S) 8.6-50 MG tablet    Sig: Take 1 tablet by mouth 2 (two) times daily between meals as needed for moderate constipation.    Refill:  0   DISCONTD: nicotine (NICODERM  CQ - DOSED IN MG/24 HOURS) 21 mg/24hr patch    Sig: Place 1 patch (21 mg total) onto the skin daily.    Dispense:  28 patch    Refill:  0    I have reviewed the patients home medicines and have made adjustments as needed  Problem List / ED Course: Problem List Items Addressed This Visit   None Visit Diagnoses     Atrial fibrillation, rapid ([place])    -  Primary   Lactic acidosis       Type 2 diabetes mellitus with hyperglycemia, without long-term current use of insulin ([place])       Relevant Orders   Diet Carb Modified                   This note was created using dictation software, which may contain spelling or grammatical errors.    Audley Hose, MD 05/15/22 [phone]

## 2022-05-21 DIAGNOSIS — M1712 Unilateral primary osteoarthritis, left knee: Secondary | ICD-10-CM | POA: Diagnosis not present

## 2022-05-22 DIAGNOSIS — J449 Chronic obstructive pulmonary disease, unspecified: Secondary | ICD-10-CM | POA: Diagnosis not present

## 2022-05-27 ENCOUNTER — Ambulatory Visit (INDEPENDENT_AMBULATORY_CARE_PROVIDER_SITE_OTHER): Payer: PPO

## 2022-05-27 DIAGNOSIS — M722 Plantar fascial fibromatosis: Secondary | ICD-10-CM | POA: Diagnosis not present

## 2022-05-27 DIAGNOSIS — E119 Type 2 diabetes mellitus without complications: Secondary | ICD-10-CM | POA: Diagnosis not present

## 2022-06-05 NOTE — Progress Notes (Unsigned)
Cardiology Office Note   Date:  06/06/2022   ID:  Michael Horn, DOB 18-Aug-1950, MRN [id number]  PCP:  Mayra Neer, MD    No chief complaint on file.  Chest pain  Wt Readings from Last 3 Encounters:  06/06/22 272 lb 6.4 oz (123.6 kg)  04/12/22 270 lb (122.5 kg)  04/08/22 269 lb 9.6 oz (122.3 kg)       History of Present Illness: Michael Horn is a 72 y.o. male  who has longstanding chest pain.    Prior records show: " Has occasional right sided chest pain.  Happens randomly, no triggers.  Occurs once every few months. Lasts a few minutes. Improves with massaging the area.    Cardiac cath was done in 2009 and did not show any blockages: "No significant coronary artery disease.  The patient is likely      having noncardiac chest pain.  2. Normal ventricular function.  3. No abdominal aortic aneurysm.  4. Normal hemodynamics.  5. No renal artery stenosis"     Echo in 2023 showed: "Left ventricular ejection fraction, by estimation, is 60 to 65%. Left  ventricular ejection fraction by 3D volume is 61 %. The left ventricle has  normal function. The left ventricle has no regional wall motion  abnormalities. There is mild concentric  left ventricular hypertrophy. Left ventricular diastolic parameters are  consistent with Grade I diastolic dysfunction (impaired relaxation).   2. Right ventricular systolic function is normal. The right ventricular  size is mildly enlarged. Tricuspid regurgitation signal is inadequate for  assessing PA pressure.   3. The mitral valve is normal in structure. No evidence of mitral valve  regurgitation. No evidence of mitral stenosis.   4. The aortic valve is tricuspid. Aortic valve regurgitation is not  visualized. Aortic valve sclerosis is present, with no evidence of aortic  valve stenosis.   5. The inferior vena cava is dilated in size with <50% respiratory  variability, suggesting right atrial pressure of 15 mmHg. "   He  feels that the right-sided chest pain that has been going on occurs about once every 3 months and has been going on for years.  It does not alarm him too much.  His walking has been limited by other issues.  He is hoping to do more walking as the weather warms up.  He has had multiple orthopedic issues and multiple orthopedic surgeries in the past."   Rare right chest pain.  Nothing in the center of the chest or the left side of the chest.   Denies : exertional Chest pain. Dizziness. Leg edema. Nitroglycerin use. Orthopnea. Palpitations. Paroxysmal nocturnal dyspnea. Syncope.     Past Medical History:  Diagnosis Date   Aortic atherosclerosis ([place])    Arthritis    Arthritis -DDD."chronic pain med used"   Atrial fibrillation ([place])    Bladder tumor    states that it was cancer   Cancer [facility])    Bladder cancer"-Dr.Ottelin- checks every 3 months--no signs last checks.   Chronic knee pain RIGHT KNEE --  S/P KNEE REPLACEMENT JAN 2012--  PT STATES  NEEDS ANOTHER REPLACEMENT DUE TO JOINT RECALL   Complication of anesthesia EMERGENCE DELIRIUM   occurred x1- 2 yrs ago.few surgeries ago, none recently   COPD (chronic obstructive pulmonary disease) ([place])    Coronary artery disease    Depression    Diverticulosis    DM type 2 (diabetes mellitus, type 2) ([facility])    novolog  BID and oral agents   GERD (gastroesophageal reflux disease)    H/O hiatal hernia    Headache    History of bladder cancer TCC OF BLADDER  --  FOLLOWED BY DR Karsten Ro   History of peptic ulcer AGE 38   Hypertension    pt denies, takes lisimopril for kidneys   Internal hemorrhoids    Mild obstructive sleep apnea    CPAP   New onset a-fib ([place]) 02/28/2022   with RVR, during hospitalization, will use 30 day heart monitor s/p urology surgery   Nocturia    Peripheral neuropathy BOTTOM RIGHT FOOT   Seasonal allergies    Short of breath on exertion    Urgency of urination     Past Surgical History:  Procedure Laterality Date    BACK SURGERY  X2   lower back   BILATERAL KNEE ARTHROSCOPY     BILATERAL SHOULDER SURG.  2003   CARDIAC CATHETERIZATION  04-13-2007   ---- DR Irish Lack   NO SIGNIFICANT CAD/ NORMAL LVF   CARPAL TUNNEL RELEASE  04/11/2006   RIGHT   COLONOSCOPY WITH PROPOFOL N/A 05/01/2015   Procedure: COLONOSCOPY WITH PROPOFOL;  Surgeon: Garlan Fair, MD;  Location: [facility];  Service: Endoscopy;  Laterality: N/A;   CYSTOSCOPY W/ RETROGRADES Bilateral 12/23/2019   Procedure: CYSTOSCOPY WITH RETROGRADE PYELOGRAM;  Surgeon: Franchot Gallo, MD;  Location: [facility];  Service: Urology;  Laterality: Bilateral;   HAND SURGERY Right    cyst removed   LEFT CARPAL. TUNNEL RELEASE     LEFT EYE SURG.   2008   REMOVAL OF BB   REPAIR RECURRENT LEFT ROTATOR CUFF TEAR  01/02/2011   REVERSE SHOULDER ARTHROPLASTY Left 04/08/2018   Procedure: REVERSE SHOULDER ARTHROPLASTY;  Surgeon: Nicholes Stairs, MD;  Location: [place];  Service: Orthopedics;  Laterality: Left;  2.5 hours   SIGMOID COLECTOMY  09/29/2001   DIVERTICULITIS   TOTAL KNEE ARTHROPLASTY  04/03/2010   RIGHT   TRANSURETHRAL RESECTION OF BLADDER TUMOR  07/06/2010   TRANSURETHRAL RESECTION OF BLADDER TUMOR  05/20/2011   Procedure: TRANSURETHRAL RESECTION OF BLADDER TUMOR (TURBT);  Surgeon: Claybon Jabs, MD;  Location: [facility];  Service: Urology;  Laterality: N/A;  GYRUS INSTILL MYTOMYCIN C NO BED   TRANSURETHRAL RESECTION OF BLADDER TUMOR  01/31/2012   Procedure: TRANSURETHRAL RESECTION OF BLADDER TUMOR (TURBT);  Surgeon: Claybon Jabs, MD;  Location: [facility];  Service: Urology;  Laterality: N/A;   TRANSURETHRAL RESECTION OF BLADDER TUMOR WITH GYRUS (TURBT-GYRUS) N/A 11/22/2013   Procedure: Bladder biopsy with fulgeration;  Surgeon: Claybon Jabs, MD;  Location: [facility];  Service: Urology;  Laterality: N/A;   TRANSURETHRAL RESECTION OF BLADDER TUMOR WITH MITOMYCIN-C  N/A 12/23/2019   Procedure: TRANSURETHRAL RESECTION OF BLADDER TUMOR WITH POST-OP  GEMCITABINE;  Surgeon: Franchot Gallo, MD;  Location: [facility];  Service: Urology;  Laterality: N/A;  Cricket TUMOR WITH MITOMYCIN-C N/A 03/21/2022   Procedure: TRANSURETHRAL RESECTION OF BLADDER TUMOR WITH GEMCITABINE;  Surgeon: Franchot Gallo, MD;  Location[facility];  Service: Urology;  Laterality: N/A;     Current Outpatient Medications  Medication Sig Dispense Refill   albuterol (VENTOLIN HFA) 108 (90 Base) MCG/ACT inhaler Inhale 2 puffs into the lungs every 4 (four) hours as needed for wheezing or shortness of breath. 1 each 5   aspirin EC 81 MG tablet Take 81 mg by  mouth 4 (four) times a week. Swallow whole.     Budeson-Glycopyrrol-Formoterol (BREZTRI AEROSPHERE) 160-9-4.8 MCG/ACT AERO Inhale 2 puffs into the lungs in the morning and at bedtime. 10.7 g 5   Budeson-Glycopyrrol-Formoterol (BREZTRI AEROSPHERE) 160-9-4.8 MCG/ACT AERO Inhale 2 puffs into the lungs in the morning and at bedtime. 10.7 g 28   buPROPion (WELLBUTRIN SR) 150 MG 12 hr tablet Take 1 tablet by mouth 2 (two) times daily.     diclofenac Sodium (VOLTAREN ARTHRITIS PAIN) 1 % GEL Apply 2 g topically 2 (two) times daily as needed (pain).     esomeprazole (NEXIUM) 40 MG capsule Take 1 capsule (40 mg total) by mouth daily before breakfast. 90 capsule 2   ezetimibe (ZETIA) 10 MG tablet Take 10 mg by mouth at bedtime.     hyoscyamine (LEVSIN) 0.125 MG tablet Take 0.125 mg by mouth every 8 (eight) hours as needed for cramping.     ipratropium-albuterol (DUONEB) 0.5-2.5 (3) MG/3ML SOLN Take 3 mLs by nebulization every 4 (four) hours as needed. 360 mL 1   levalbuterol (XOPENEX) 0.63 MG/3ML nebulizer solution Take 3 mLs (0.63 mg total) by nebulization every 6 (six) hours as needed for wheezing or shortness of breath. 75 mL 5   lisinopril (PRINIVIL,ZESTRIL) 10 MG tablet Take  10 mg by mouth every morning.     metFORMIN (GLUCOPHAGE) 1000 MG tablet Take 1,000 mg by mouth 2 (two) times daily.     NOVOLIN 70/30 (70-30) 100 UNIT/ML injection PLEASE SEE ATTACHED FOR DETAILED DIRECTIONS     oxybutynin (DITROPAN) 5 MG tablet 1 p.o. every 6 hours as needed bladder spasms/bladder pain 15 tablet 1   Oxycodone HCl 10 MG TABS Take 10 mg by mouth every 6 (six) hours as needed (pain).     pravastatin (PRAVACHOL) 40 MG tablet Take 40 mg by mouth daily.     pregabalin (LYRICA) 150 MG capsule Take 150 mg by mouth 2 (two) times daily.     tamsulosin (FLOMAX) 0.4 MG CAPS capsule Take 0.4 mg by mouth every morning.     traZODone (DESYREL) 100 MG tablet Take 100 mg by mouth at bedtime.     trospium (SANCTURA) 20 MG tablet Take 20 mg by mouth 2 (two) times daily.     docusate sodium (COLACE) 100 MG capsule Take 100 mg by mouth daily as needed for mild constipation. (Patient not taking: Reported on 06/06/2022)     insulin aspart protamine- aspart (NOVOLOG MIX 70/30) (70-30) 100 UNIT/ML injection Inject 55 Units into the skin 2 (two) times daily. (Patient not taking: Reported on 06/06/2022)     senna-docusate (SENOKOT-S) 8.6-50 MG tablet Take 1 tablet by mouth 2 (two) times daily between meals as needed for moderate constipation. (Patient not taking: Reported on 06/06/2022)  0   No current facility-administered medications for this visit.    Allergies:   Aripiprazole, Contrast media [iodinated contrast media], Dulaglutide, and Rosuvastatin    Social History:  The patient  reports that he has been smoking cigarettes. He has a 60.00 pack-year smoking history. He has never used smokeless tobacco. He reports that he does not drink alcohol and does not use drugs.   Family History:  The patient's family history includes Heart attack in his mother; Lung cancer in his nephew.    ROS:  Please see the history of present illness.   Otherwise, review of systems are positive for balance problems; fell a  few years ago.   All other systems are reviewed and  negative.    PHYSICAL EXAM: VS:  BP 134/68   Pulse 77   Ht 5\' 10"  (1.778 m)   Wt 272 lb 6.4 oz (123.6 kg)   SpO2 97%   BMI 39.09 kg/m  , BMI Body mass index is 39.09 kg/m. GEN: Well nourished, well developed, in no acute distress HEENT: normal Neck: no JVD, carotid bruits, or masses Cardiac: RRR; no murmurs, rubs, or gallops,no edema  Respiratory:  clear to auscultation bilaterally, normal work of breathing GI: soft, nontender, nondistended, + BS MS: no deformity or atrophy Skin: warm and dry, no rash Neuro:  Strength and sensation are intact Psych: euthymic mood, full affect   EKG:   The ekg ordered 1/24 demonstrates NSR   Recent Labs: 02/28/2022: ALT 39; TSH 2.179 03/01/2022: Magnesium 1.8 03/29/2022: B Natriuretic Peptide 36.0; BUN 26; Creatinine, Ser 1.01; Hemoglobin 10.6; Platelets 180; Potassium 3.9; Sodium 133   Lipid Panel No results found for: "CHOL", "TRIG", "HDL", "CHOLHDL", "VLDL", "LDLCALC", "LDLDIRECT"   Other studies Reviewed: Additional studies/ records that were reviewed today with results demonstrating: Negative stress test in January 2024.   ASSESSMENT AND PLAN:  Chest pain: unchanged from prior.  Atypical chest pain.  Does not seem ischemic. AFib: noted in 12/23. No anticoagulation. Normal LV function and normal atrial size in 4/23.  No recurrent symptoms.  Episode of atrial fibrillation happened around the time when he had a respiratory infection. Diabetes: A1c 8.7 in January 2024 Hyperlipidemia: LDL 57 in January 2024 Former smoker: quit 2 weeks ago.  On inhalers for COPD.  I suspect his chronic dyspnea is related to his long history of smoking.  Negative stress test in January 2024   Current medicines are reviewed at length with the patient today.  The patient concerns regarding his medicines were addressed.  The following changes have been made:  No change  Labs/ tests ordered today  include:  No orders of the defined types were placed in this encounter.   Recommend 150 minutes/week of aerobic exercise Low fat, low carb, high fiber diet recommended  Disposition:   FU in 1 year   Signed, Larae Grooms, MD  06/06/2022 2:10 PM    [facility] [place], [place], [address]  [address] Phone: [phone]; Fax: [phone]

## 2022-06-06 ENCOUNTER — Encounter: Payer: Self-pay | Admitting: Interventional Cardiology

## 2022-06-06 ENCOUNTER — Ambulatory Visit: Payer: PPO | Attending: Interventional Cardiology | Admitting: Interventional Cardiology

## 2022-06-06 VITALS — BP 134/68 | HR 77 | Ht 70.0 in | Wt 272.4 lb

## 2022-06-06 DIAGNOSIS — I4891 Unspecified atrial fibrillation: Secondary | ICD-10-CM | POA: Diagnosis not present

## 2022-06-06 DIAGNOSIS — E782 Mixed hyperlipidemia: Secondary | ICD-10-CM | POA: Diagnosis not present

## 2022-06-06 DIAGNOSIS — E1169 Type 2 diabetes mellitus with other specified complication: Secondary | ICD-10-CM | POA: Diagnosis not present

## 2022-06-06 DIAGNOSIS — Z794 Long term (current) use of insulin: Secondary | ICD-10-CM

## 2022-06-06 DIAGNOSIS — R0609 Other forms of dyspnea: Secondary | ICD-10-CM | POA: Diagnosis not present

## 2022-06-06 DIAGNOSIS — F17200 Nicotine dependence, unspecified, uncomplicated: Secondary | ICD-10-CM | POA: Diagnosis not present

## 2022-06-06 DIAGNOSIS — R079 Chest pain, unspecified: Secondary | ICD-10-CM | POA: Diagnosis not present

## 2022-06-06 NOTE — Patient Instructions (Signed)
Medication Instructions:  Your physician recommends that you continue on your current medications as directed. Please refer to the Current Medication list given to you today.  *If you need a refill on your cardiac medications before your next appointment, please call your pharmacy*   Lab Work: none If you have labs (blood work) drawn today and your tests are completely normal, you will receive your results only by: [place] (if you have MyChart) OR A paper copy in the mail If you have any lab test that is abnormal or we need to change your treatment, we will call you to review the results.   Testing/Procedures: none   Follow-Up: At [facility], you and your health needs are our priority.  As part of our continuing mission to provide you with exceptional heart care, we have created designated Provider Care Teams.  These Care Teams include your primary Cardiologist (physician) and Advanced Practice Providers (APPs -  Physician Assistants and Nurse Practitioners) who all work together to provide you with the care you need, when you need it.  We recommend signing up for the patient portal called "MyChart".  Sign up information is provided on this After Visit Summary.  MyChart is used to connect with patients for Virtual Visits (Telemedicine).  Patients are able to view lab/test results, encounter notes, upcoming appointments, etc.  Non-urgent messages can be sent to your provider as well.   To learn more about what you can do with MyChart, go to [email or web address].    Your next appointment:   12 month(s)  Provider:   Larae Grooms, MD     Other Instructions Please contact the office if you have any more atrial fibrillation

## 2022-06-22 DIAGNOSIS — J449 Chronic obstructive pulmonary disease, unspecified: Secondary | ICD-10-CM | POA: Diagnosis not present

## 2022-06-27 DIAGNOSIS — H31092 Other chorioretinal scars, left eye: Secondary | ICD-10-CM | POA: Diagnosis not present

## 2022-06-27 DIAGNOSIS — H52223 Regular astigmatism, bilateral: Secondary | ICD-10-CM | POA: Diagnosis not present

## 2022-06-27 DIAGNOSIS — H04123 Dry eye syndrome of bilateral lacrimal glands: Secondary | ICD-10-CM | POA: Diagnosis not present

## 2022-06-27 DIAGNOSIS — H2513 Age-related nuclear cataract, bilateral: Secondary | ICD-10-CM | POA: Diagnosis not present

## 2022-06-27 DIAGNOSIS — E119 Type 2 diabetes mellitus without complications: Secondary | ICD-10-CM | POA: Diagnosis not present

## 2022-06-27 DIAGNOSIS — H02839 Dermatochalasis of unspecified eye, unspecified eyelid: Secondary | ICD-10-CM | POA: Diagnosis not present

## 2022-06-28 ENCOUNTER — Telehealth: Payer: Self-pay | Admitting: Nurse Practitioner

## 2022-06-28 NOTE — Telephone Encounter (Signed)
Pt needs sample of breztri due to him not being able to afford a prescription right now

## 2022-07-01 NOTE — Telephone Encounter (Signed)
Spoke with patient. Advised samples and patient assistance paperwork would be waiting at the front for him. Advised pt if patient assistance would not cover Breztri, will need to explore cheaper options. He verbalized understanding. NFN

## 2022-07-01 NOTE — Telephone Encounter (Signed)
Yes, that is fine. Please provide him with the patient assistance application as well. Samples not a long term solution so need to figure out if we can either get the medication covered or if we need to try an alternative regimen that is more affordable. Thanks!

## 2022-07-01 NOTE — Telephone Encounter (Signed)
Michael Horn are you okay with [place] giving patient samples of Breztri?

## 2022-07-03 ENCOUNTER — Ambulatory Visit (INDEPENDENT_AMBULATORY_CARE_PROVIDER_SITE_OTHER): Payer: PPO | Admitting: Podiatry

## 2022-07-03 ENCOUNTER — Encounter: Payer: Self-pay | Admitting: Podiatry

## 2022-07-03 DIAGNOSIS — E119 Type 2 diabetes mellitus without complications: Secondary | ICD-10-CM

## 2022-07-03 DIAGNOSIS — B351 Tinea unguium: Secondary | ICD-10-CM

## 2022-07-03 DIAGNOSIS — M79674 Pain in right toe(s): Secondary | ICD-10-CM | POA: Diagnosis not present

## 2022-07-03 DIAGNOSIS — Z794 Long term (current) use of insulin: Secondary | ICD-10-CM | POA: Diagnosis not present

## 2022-07-03 DIAGNOSIS — J449 Chronic obstructive pulmonary disease, unspecified: Secondary | ICD-10-CM | POA: Diagnosis not present

## 2022-07-03 DIAGNOSIS — M79675 Pain in left toe(s): Secondary | ICD-10-CM | POA: Diagnosis not present

## 2022-07-03 NOTE — Progress Notes (Signed)
This patient presents to the office with chief complaint of long thick painful nails.  Patient says the nails are painful walking and wearing shoes.  This patient is unable to self treat.  This patient is unable to trim his  nails since he is unable to reach his nails.  he presents to the office for preventative foot care services.  General Appearance  Alert, conversant and in no acute stress.  Vascular  Dorsalis pedis and posterior tibial  pulses are palpable  bilaterally.  Capillary return is within normal limits  bilaterally. Temperature is within normal limits  bilaterally.  Neurologic  Senn-Weinstein monofilament wire test diminished  bilaterally. Muscle power within normal limits bilaterally.  Nails Thick disfigured discolored nails with subungual debris  from hallux to fifth toes bilaterally. No evidence of bacterial infection or drainage bilaterally.  Orthopedic  No limitations of motion  feet .  No crepitus or effusions noted.  No bony pathology or digital deformities noted.  Skin  normotropic skin with no porokeratosis noted bilaterally.  No signs of infections or ulcers noted.     Onychomycosis  Nails  B/L.  Pain in right toes  Pain in left toes  Debridement of nails both feet followed trimming the nails with dremel tool.    RTC 4  months.   Helane Gunther DPM

## 2022-07-04 DIAGNOSIS — M47817 Spondylosis without myelopathy or radiculopathy, lumbosacral region: Secondary | ICD-10-CM | POA: Diagnosis not present

## 2022-07-04 DIAGNOSIS — G894 Chronic pain syndrome: Secondary | ICD-10-CM | POA: Diagnosis not present

## 2022-07-04 DIAGNOSIS — M25561 Pain in right knee: Secondary | ICD-10-CM | POA: Diagnosis not present

## 2022-07-04 DIAGNOSIS — M62830 Muscle spasm of back: Secondary | ICD-10-CM | POA: Diagnosis not present

## 2022-07-08 NOTE — Progress Notes (Signed)
Patient presents today to pick up diabetic shoes and insoles.  Patient was dispensed 1 pair of diabetic shoes and 3 pairs of foam casted diabetic insoles. Fit was satisfactory. Instructions for break-in and wear was reviewed and a copy was given to the patient.   Re-appointment for regularly scheduled diabetic foot care visits or if they should experience any trouble with the shoes or insoles.  

## 2022-07-10 DIAGNOSIS — I10 Essential (primary) hypertension: Secondary | ICD-10-CM | POA: Diagnosis not present

## 2022-07-10 DIAGNOSIS — E1165 Type 2 diabetes mellitus with hyperglycemia: Secondary | ICD-10-CM | POA: Diagnosis not present

## 2022-07-10 DIAGNOSIS — Z6841 Body Mass Index (BMI) 40.0 and over, adult: Secondary | ICD-10-CM | POA: Diagnosis not present

## 2022-07-10 DIAGNOSIS — Z794 Long term (current) use of insulin: Secondary | ICD-10-CM | POA: Diagnosis not present

## 2022-07-10 DIAGNOSIS — E1142 Type 2 diabetes mellitus with diabetic polyneuropathy: Secondary | ICD-10-CM | POA: Diagnosis not present

## 2022-07-10 DIAGNOSIS — I700 Atherosclerosis of aorta: Secondary | ICD-10-CM | POA: Diagnosis not present

## 2022-07-10 DIAGNOSIS — Z92241 Personal history of systemic steroid therapy: Secondary | ICD-10-CM | POA: Diagnosis not present

## 2022-07-10 DIAGNOSIS — D692 Other nonthrombocytopenic purpura: Secondary | ICD-10-CM | POA: Diagnosis not present

## 2022-07-22 DIAGNOSIS — J449 Chronic obstructive pulmonary disease, unspecified: Secondary | ICD-10-CM | POA: Diagnosis not present

## 2022-08-02 DIAGNOSIS — I10 Essential (primary) hypertension: Secondary | ICD-10-CM | POA: Diagnosis not present

## 2022-08-02 DIAGNOSIS — K219 Gastro-esophageal reflux disease without esophagitis: Secondary | ICD-10-CM | POA: Diagnosis not present

## 2022-08-02 DIAGNOSIS — J449 Chronic obstructive pulmonary disease, unspecified: Secondary | ICD-10-CM | POA: Diagnosis not present

## 2022-08-02 DIAGNOSIS — E1142 Type 2 diabetes mellitus with diabetic polyneuropathy: Secondary | ICD-10-CM | POA: Diagnosis not present

## 2022-08-02 DIAGNOSIS — M159 Polyosteoarthritis, unspecified: Secondary | ICD-10-CM | POA: Diagnosis not present

## 2022-08-02 DIAGNOSIS — F3341 Major depressive disorder, recurrent, in partial remission: Secondary | ICD-10-CM | POA: Diagnosis not present

## 2022-08-02 DIAGNOSIS — E782 Mixed hyperlipidemia: Secondary | ICD-10-CM | POA: Diagnosis not present

## 2022-08-02 DIAGNOSIS — E1159 Type 2 diabetes mellitus with other circulatory complications: Secondary | ICD-10-CM | POA: Diagnosis not present

## 2022-08-02 DIAGNOSIS — F322 Major depressive disorder, single episode, severe without psychotic features: Secondary | ICD-10-CM | POA: Diagnosis not present

## 2022-08-22 DIAGNOSIS — J449 Chronic obstructive pulmonary disease, unspecified: Secondary | ICD-10-CM | POA: Diagnosis not present

## 2022-08-26 DIAGNOSIS — L02212 Cutaneous abscess of back [any part, except buttock]: Secondary | ICD-10-CM | POA: Diagnosis not present

## 2022-08-26 DIAGNOSIS — L720 Epidermal cyst: Secondary | ICD-10-CM | POA: Diagnosis not present

## 2022-08-29 DIAGNOSIS — G894 Chronic pain syndrome: Secondary | ICD-10-CM | POA: Diagnosis not present

## 2022-08-29 DIAGNOSIS — M47817 Spondylosis without myelopathy or radiculopathy, lumbosacral region: Secondary | ICD-10-CM | POA: Diagnosis not present

## 2022-08-29 DIAGNOSIS — M25561 Pain in right knee: Secondary | ICD-10-CM | POA: Diagnosis not present

## 2022-08-29 DIAGNOSIS — M62830 Muscle spasm of back: Secondary | ICD-10-CM | POA: Diagnosis not present

## 2022-09-21 DIAGNOSIS — J449 Chronic obstructive pulmonary disease, unspecified: Secondary | ICD-10-CM | POA: Diagnosis not present

## 2022-09-24 DIAGNOSIS — H25013 Cortical age-related cataract, bilateral: Secondary | ICD-10-CM | POA: Diagnosis not present

## 2022-09-24 DIAGNOSIS — H25043 Posterior subcapsular polar age-related cataract, bilateral: Secondary | ICD-10-CM | POA: Diagnosis not present

## 2022-09-24 DIAGNOSIS — H2513 Age-related nuclear cataract, bilateral: Secondary | ICD-10-CM | POA: Diagnosis not present

## 2022-09-24 DIAGNOSIS — H18413 Arcus senilis, bilateral: Secondary | ICD-10-CM | POA: Diagnosis not present

## 2022-09-24 DIAGNOSIS — H2512 Age-related nuclear cataract, left eye: Secondary | ICD-10-CM | POA: Diagnosis not present

## 2022-09-26 IMAGING — DX DG SHOULDER 2+V*L*
4 series · 4 of 4 positions shown · non-contrast
Comparison: None.

CLINICAL DATA: Left shoulder pain, fall

EXAM:
LEFT SHOULDER - 2+ VIEW

[shoulder grashey]
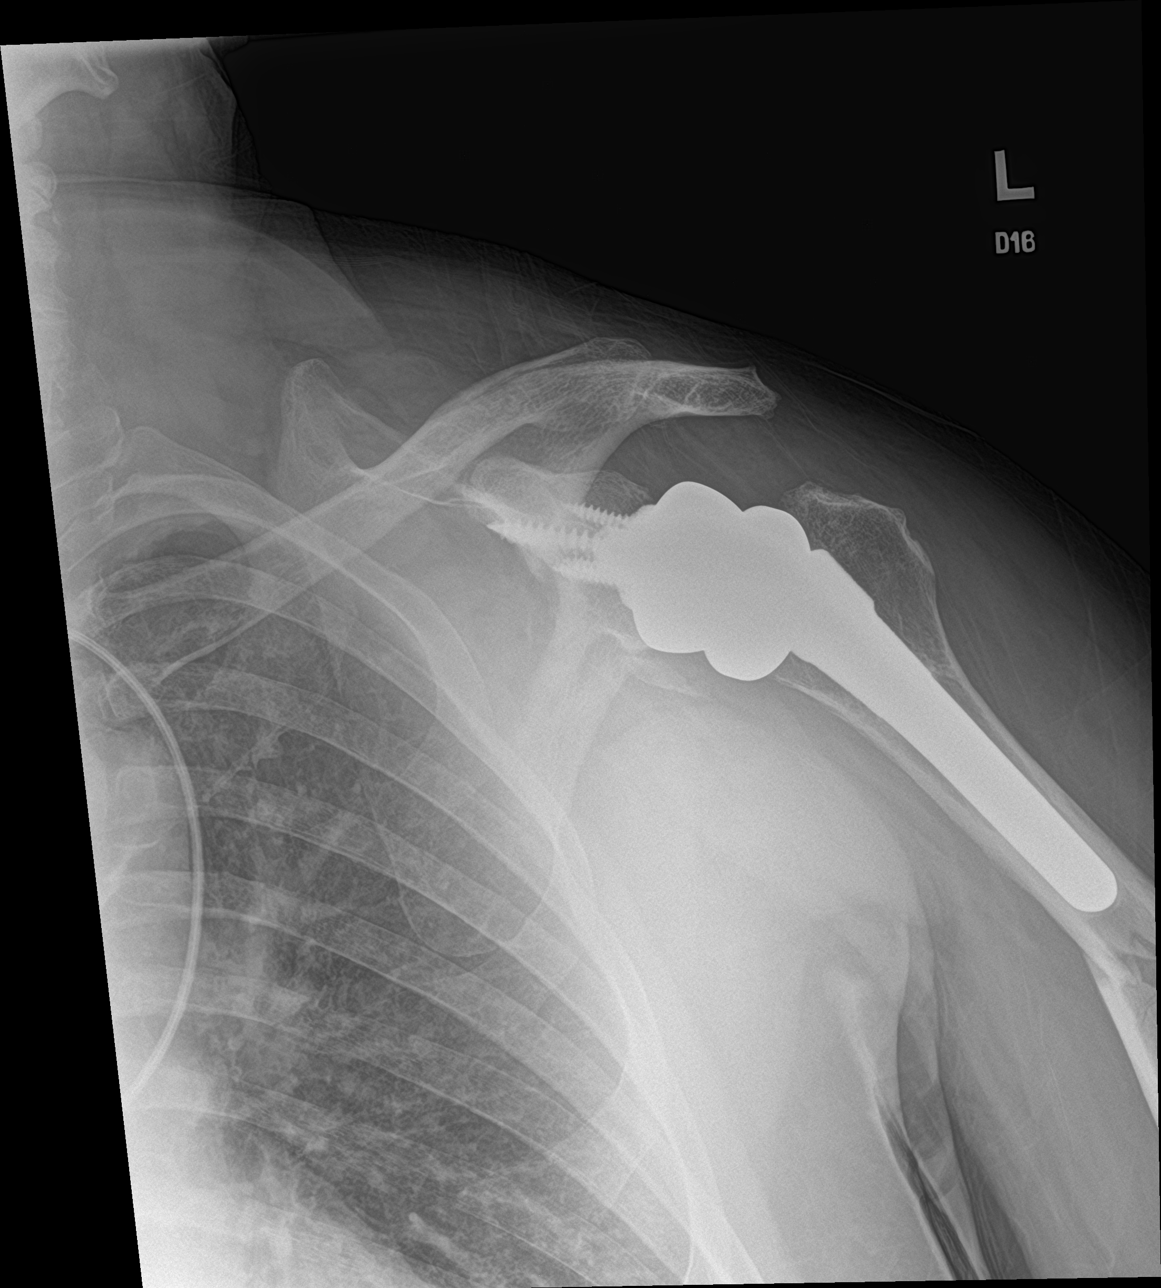

[shoulder y view]
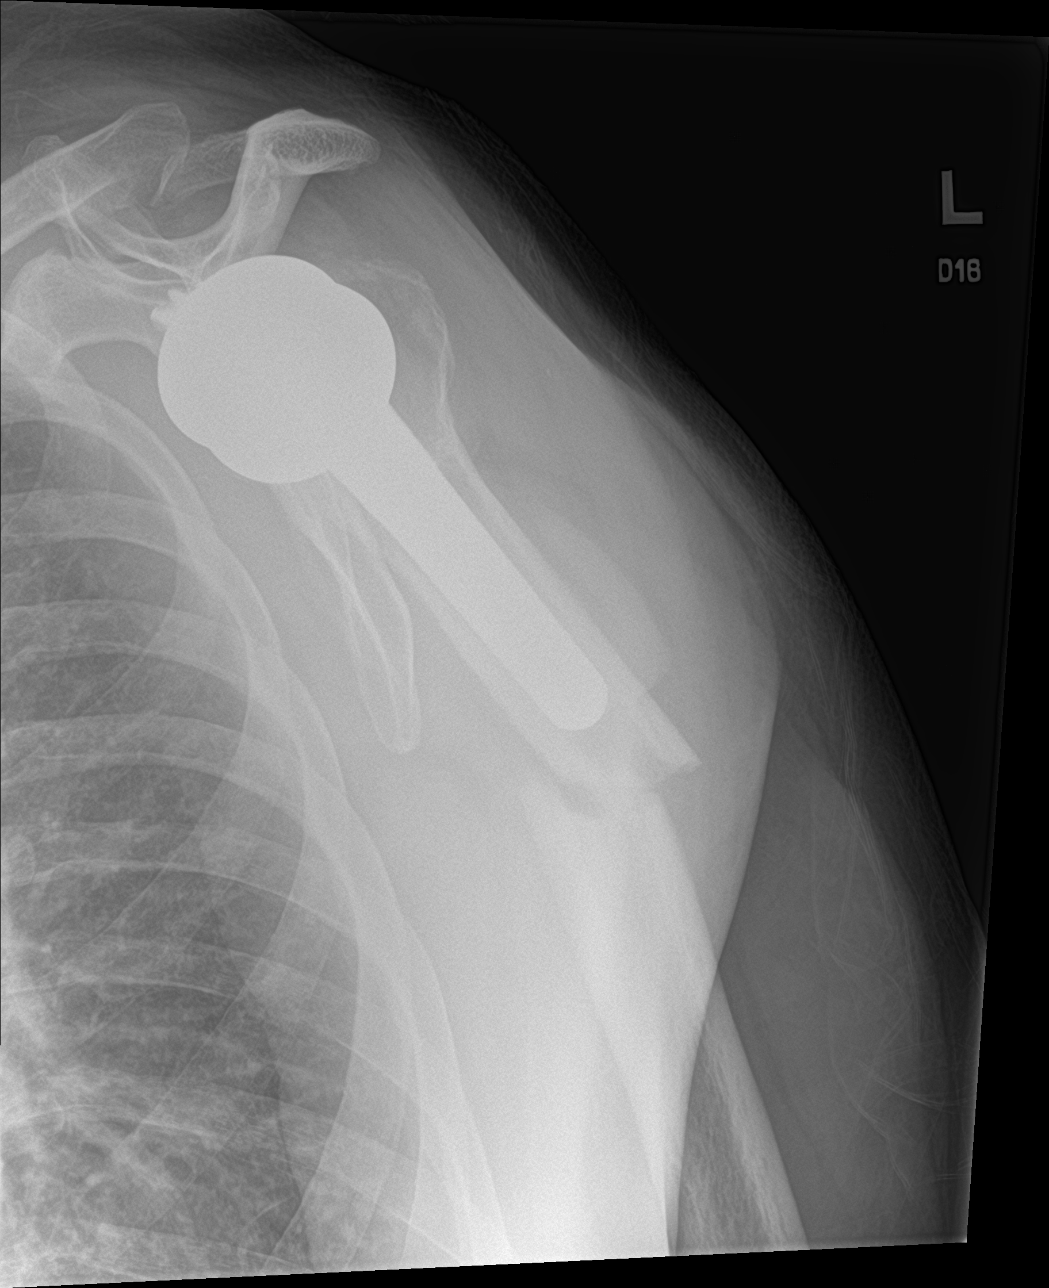

[shoulder ap neutral (1 of 2)]
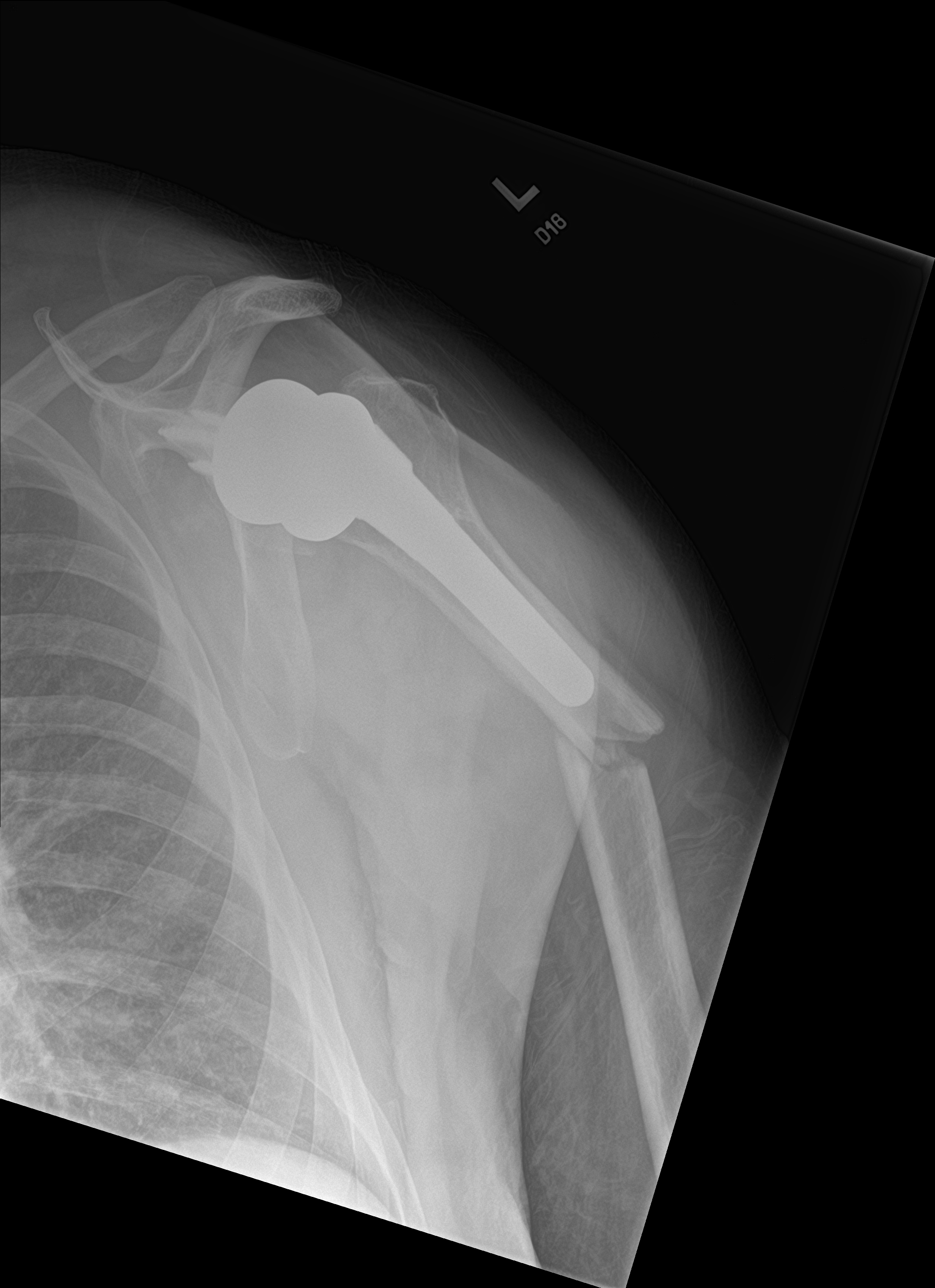

[shoulder ap neutral (2 of 2)]
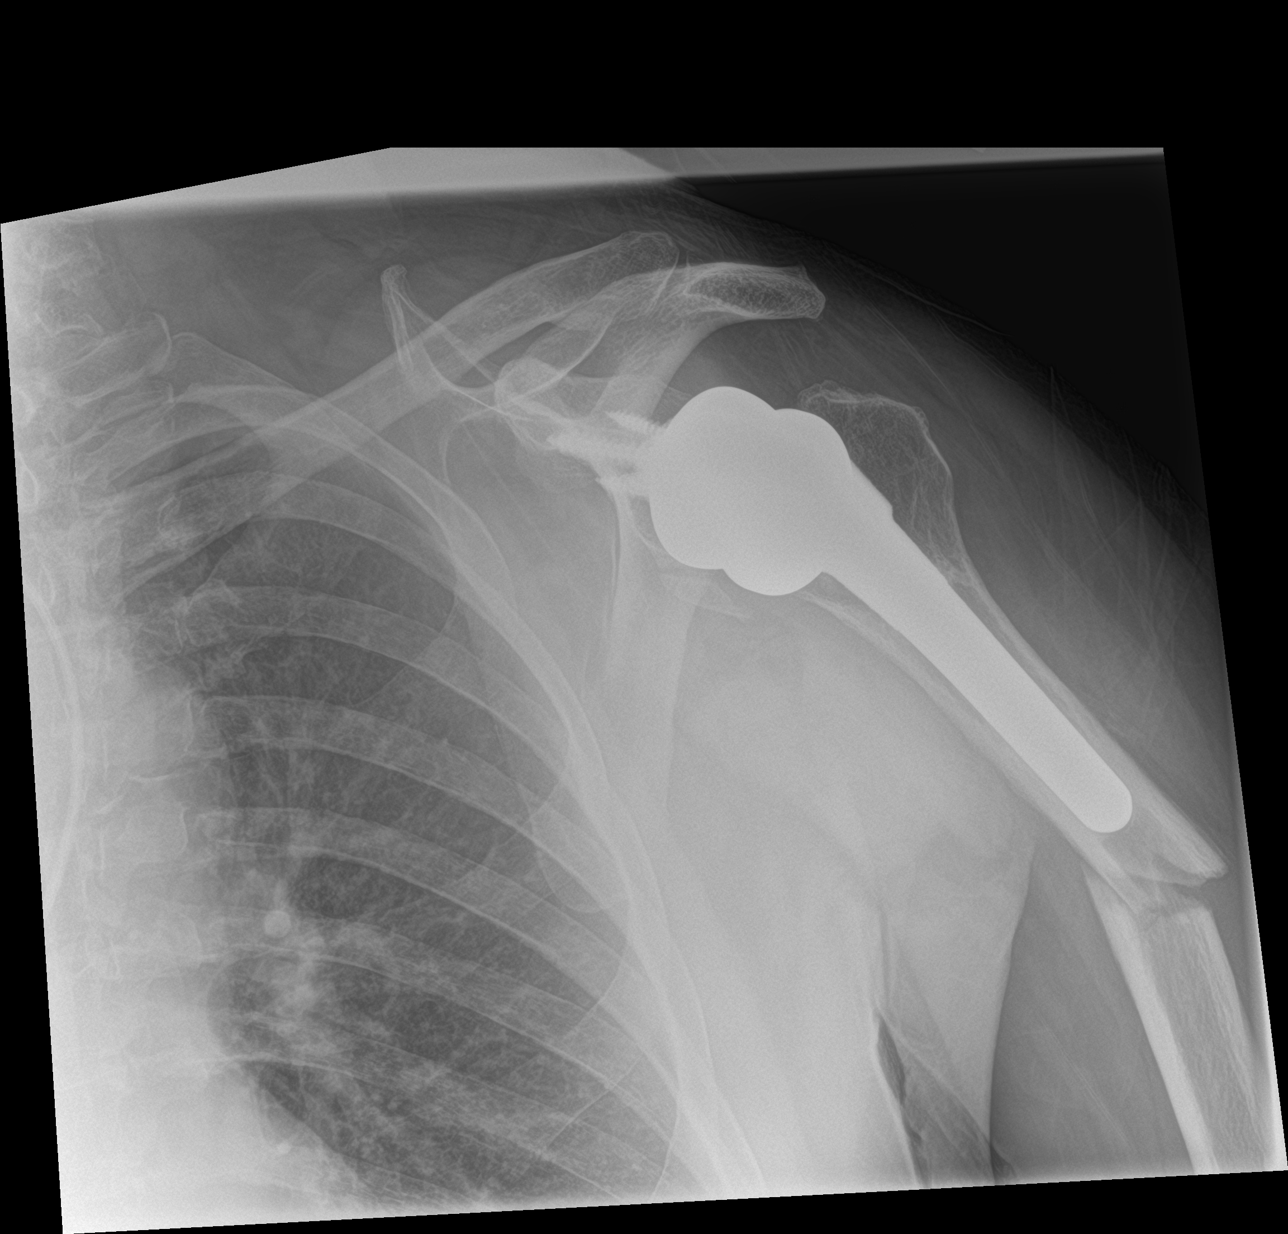

[4 of 4 positions shown; findings below may reference images not displayed]

FINDINGS: Prior left shoulder replacement. Just below the humeral shaft
hardware, there is a mid humeral fracture with angulation and
displacement. No subluxation or dislocation.
IMPRESSION: Angulated, displaced mid left humeral shaft fracture just below the
shoulder replacement hardware.

## 2022-10-01 DIAGNOSIS — M1712 Unilateral primary osteoarthritis, left knee: Secondary | ICD-10-CM | POA: Diagnosis not present

## 2022-10-01 DIAGNOSIS — M7711 Lateral epicondylitis, right elbow: Secondary | ICD-10-CM | POA: Diagnosis not present

## 2022-10-02 ENCOUNTER — Other Ambulatory Visit: Payer: Self-pay | Admitting: Family Medicine

## 2022-10-02 DIAGNOSIS — K219 Gastro-esophageal reflux disease without esophagitis: Secondary | ICD-10-CM | POA: Diagnosis not present

## 2022-10-02 DIAGNOSIS — E1142 Type 2 diabetes mellitus with diabetic polyneuropathy: Secondary | ICD-10-CM | POA: Diagnosis not present

## 2022-10-02 DIAGNOSIS — R109 Unspecified abdominal pain: Secondary | ICD-10-CM

## 2022-10-02 DIAGNOSIS — R809 Proteinuria, unspecified: Secondary | ICD-10-CM | POA: Diagnosis not present

## 2022-10-02 DIAGNOSIS — F17200 Nicotine dependence, unspecified, uncomplicated: Secondary | ICD-10-CM | POA: Diagnosis not present

## 2022-10-02 DIAGNOSIS — E782 Mixed hyperlipidemia: Secondary | ICD-10-CM | POA: Diagnosis not present

## 2022-10-02 DIAGNOSIS — R413 Other amnesia: Secondary | ICD-10-CM | POA: Diagnosis not present

## 2022-10-02 DIAGNOSIS — G629 Polyneuropathy, unspecified: Secondary | ICD-10-CM | POA: Diagnosis not present

## 2022-10-02 DIAGNOSIS — I10 Essential (primary) hypertension: Secondary | ICD-10-CM | POA: Diagnosis not present

## 2022-10-02 LAB — LAB REPORT - SCANNED: EGFR: 66

## 2022-10-07 ENCOUNTER — Encounter: Payer: Self-pay | Admitting: Podiatry

## 2022-10-07 ENCOUNTER — Ambulatory Visit: Payer: PPO | Admitting: Nurse Practitioner

## 2022-10-07 ENCOUNTER — Ambulatory Visit (INDEPENDENT_AMBULATORY_CARE_PROVIDER_SITE_OTHER): Payer: PPO | Admitting: Podiatry

## 2022-10-07 VITALS — BP 125/56 | HR 81

## 2022-10-07 DIAGNOSIS — M79674 Pain in right toe(s): Secondary | ICD-10-CM | POA: Diagnosis not present

## 2022-10-07 DIAGNOSIS — B351 Tinea unguium: Secondary | ICD-10-CM

## 2022-10-07 DIAGNOSIS — M79675 Pain in left toe(s): Secondary | ICD-10-CM | POA: Diagnosis not present

## 2022-10-07 DIAGNOSIS — E0843 Diabetes mellitus due to underlying condition with diabetic autonomic (poly)neuropathy: Secondary | ICD-10-CM

## 2022-10-07 DIAGNOSIS — N401 Enlarged prostate with lower urinary tract symptoms: Secondary | ICD-10-CM | POA: Diagnosis not present

## 2022-10-07 NOTE — Progress Notes (Signed)
This patient presents to the office with chief complaint of long thick painful nails.  Patient says the nails are painful walking and wearing shoes.  This patient is unable to self treat.  This patient is unable to trim his  nails since he is unable to reach his nails.  he presents to the office for preventative foot care services.  General Appearance  Alert, conversant and in no acute stress.  Vascular  Dorsalis pedis and posterior tibial  pulses are palpable  bilaterally.  Capillary return is within normal limits  bilaterally. Temperature is within normal limits  bilaterally.  Neurologic  Senn-Weinstein monofilament wire test diminished  bilaterally. Muscle power within normal limits bilaterally.  Nails Thick disfigured discolored nails with subungual debris  from hallux to fifth toes bilaterally. No evidence of bacterial infection or drainage bilaterally.  Orthopedic  No limitations of motion  feet .  No crepitus or effusions noted.  No bony pathology or digital deformities noted.  Skin  normotropic skin with no porokeratosis noted bilaterally.  No signs of infections or ulcers noted.     Onychomycosis  Nails  B/L.  Pain in right toes  Pain in left toes  Debridement of nails both feet followed trimming the nails with dremel tool.    RTC 3 months.   Gardiner Barefoot DPM

## 2022-10-08 ENCOUNTER — Encounter: Payer: Self-pay | Admitting: Nurse Practitioner

## 2022-10-08 ENCOUNTER — Ambulatory Visit (INDEPENDENT_AMBULATORY_CARE_PROVIDER_SITE_OTHER): Payer: PPO | Admitting: Nurse Practitioner

## 2022-10-08 VITALS — BP 140/68 | HR 80 | Temp 98.3°F | Ht 70.5 in | Wt 261.6 lb

## 2022-10-08 DIAGNOSIS — Z720 Tobacco use: Secondary | ICD-10-CM

## 2022-10-08 DIAGNOSIS — J449 Chronic obstructive pulmonary disease, unspecified: Secondary | ICD-10-CM

## 2022-10-08 DIAGNOSIS — G4733 Obstructive sleep apnea (adult) (pediatric): Secondary | ICD-10-CM | POA: Diagnosis not present

## 2022-10-08 MED ORDER — BREZTRI AEROSPHERE 160-9-4.8 MCG/ACT IN AERO: 2.0000 | INHALATION_SPRAY | Freq: Two times a day (BID) | RESPIRATORY_TRACT | Status: DC

## 2022-10-08 MED ORDER — BREZTRI AEROSPHERE 160-9-4.8 MCG/ACT IN AERO: 2.0000 | INHALATION_SPRAY | Freq: Two times a day (BID) | RESPIRATORY_TRACT | 12 refills | Status: DC

## 2022-10-08 NOTE — Assessment & Plan Note (Signed)
Excellent compliance. Aware of safe driving practices. Encouraged continued nightly use.

## 2022-10-08 NOTE — Assessment & Plan Note (Signed)
Clinically improved. Continued smoking cessation encouraged. Discussed the importance of maintenance therapy. Will resume his Breztri, if covered. If not, will find alternative therapy options that are affordable. Action plan in place. Encouraged to remain active.  Patient Instructions  Restart Breztri 2 puffs Twice daily. Brush tongue and rinse mouth afterwards. Call me if this is still expensive  Continue Albuterol inhaler 2 puffs or 3 mL neb every 6 hours as needed for shortness of breath or wheezing. Notify if symptoms persist despite rescue inhaler/neb use.  Continue CPAP nightly, minimum of 4-6 hours.   Congrats on cutting back on smoking!! Keep up the strong work    Follow up in 4 months with Dr. Celine Mans. If symptoms worsen, please contact office for sooner follow up or seek emergency care.

## 2022-10-08 NOTE — Patient Instructions (Addendum)
Restart Breztri 2 puffs Twice daily. Brush tongue and rinse mouth afterwards. Call me if this is still expensive  Continue Albuterol inhaler 2 puffs or 3 mL neb every 6 hours as needed for shortness of breath or wheezing. Notify if symptoms persist despite rescue inhaler/neb use.  Continue CPAP nightly, minimum of 4-6 hours.   Congrats on cutting back on smoking!! Keep up the strong work    Follow up in 4 months with Dr. Celine Mans. If symptoms worsen, please contact office for sooner follow up or seek emergency care.

## 2022-10-08 NOTE — Assessment & Plan Note (Addendum)
Cut back from 2 ppd to 5 cigarettes. Smoking cessation advised. Referral to lung cancer screening program - next due 02/2023.

## 2022-10-08 NOTE — Progress Notes (Signed)
@Patient  ID: Michael Horn, male    DOB: 10-24-1950, 72 y.o.   MRN: [id number]  Chief Complaint  Patient presents with   Follow-up    COPD,OSA follow up, states he did not qualify for patient assist for Breztri, not taking Breztri at this time, smoking 5 cigarettes a day    Referring provider: Lupita Raider, MD  HPI: 72 year old male, active smoker (quit 03/18/2022) followed for COPD with chronic bronchitis and emphysema, and OSA on CPAP. He is followed by Dr. Celine Mans for COPD and Dr. Vassie Loll for sleep. Last seen 04/08/2022 by Lucy Boardman NP. Past medical history significant for DM, obesity, HLD. He was recently diagnosed with bladder cancer.   TEST/EVENTS:  02/02/2021 PFT: FVC 82, FEV1 77, ratio 71, TLC 105, DLCOcor 89. No BD 04/2021 HST: AHI 7/h, supine 15/h; lowest SpO2 77% 07/09/2021 CPAP titration >> optimal pressure 15 cmH2O with residual AHI 1.4, SpO2 min at optimal pressure 89%  12/20/2021: OV with Dr. Celine Mans. Seen Dr. Vassie Loll for OSA; started on CPAP. Feels sleep has improved. Excellent compliance with AHI <1/h. Not using Breztri consistently. Still smoking 1.5 ppd. Having wheezing, chest tightness. Coughing up clear phlegm. Asking if he needs oxygen. Instructed to resume Breztri inhaler for maintenance and albuterol for rescue. Needs referral to lung cancer screening program. Walk test with SpO2 low 92% on room air. No need for supplemental O2.   03/26/2022: OV with Maira Christon NP for acute visit. He was recently diagnosed with bladder cancer and underwent resection on 03/21/2022 with Dr. Retta Diones. This past Saturday, 1/14, he started having fevers (t max 101.4), increased shortness of breath and a productive cough with yellow to brown sputum. He has not had any more fevers but continues to have trouble with his breathing and congestion. He feels like the only time he gets relief is when he uses his son's nebulizer treatments. They do leave him feeling very jittery though. He denies any calf pain/swelling,  hemoptysis, chills.   04/08/2022: OV with Kaysea Raya NP for follow up. He had been treated for AECOPD at our last visit with steroids and antibiotics. He contacted the office three days later with persistent symptoms without any improvement and was advised to seek further evaluation in the ED given his post-operative status. He was seen 1/19; d dimer was nl when age adjusted. He was treated with neb and instructed to complete previously prescribed medications. Today, he tells me he is feeling much better. His breathing is back to his baseline and cough has resolved for the most part. He still has a minimally productive AM cough with clear sputum. He denies any wheezing, chills, fevers, hemoptysis. He is using his breztri inhaler twice daily. Still has not heard from patient assistance on this. He did pick smoking back up; around 1 ppd. Has had trouble affording pharmacological therapy for smoking cessation.  He is wearing his CPAP nightly. Sleeps well with this. Denies any residual daytime fatigue or snoring.  03/09/2022-04/07/2022: CPAP 10-15 cmH2O 30/30 days; 97% >4 hr; av use 7 hr 45 min Pressure 95th 14.2 Leaks 95th 9.2 AHI 2.3  10/08/2022: Today - follow up Patient presents today for follow up. Doing well since his last visit. He has cut back from 2 packs a day to 5 cigarettes and feels like this has made a huge difference. He's able to get outside and work without getting as short winded. Occasionally has to stop to rest but feels like his stamina has improved. No significant cough, chest  congestion, wheezing. He did feel like the Breztri helped open his chest up some more. He was unable to get patient assistance for this. He's using his albuterol daily. Wears his CPAP nightly. No drowsy driving or morning headaches.   Allergies  Allergen Reactions   Aripiprazole     Other reaction(s): Unknown   Contrast Media [Iodinated Contrast Media] Rash   Dulaglutide     Other reaction(s): Unknown    Rosuvastatin     Other reaction(s): Unknown    Immunization History  Administered Date(s) Administered   Fluad Quad(high Dose 65+) 01/18/2021   Influenza Split 01/14/2008, 01/26/2009, 01/21/2012, 12/30/2012, 12/24/2016, 01/12/2018, 01/16/2021   Influenza, High Dose Seasonal PF 01/12/2018   Influenza,inj,Quad PF,6+ Mos 01/15/2011   Influenza,inj,quad, With Preservative 12/27/2013, 01/12/2018   Moderna Sars-Covid-2 Vaccination 04/02/2019, 05/03/2019, 02/11/2020   Pneumococcal Conjugate-13 08/04/2014   Pneumococcal Polysaccharide-23 01/12/2007, 01/12/2016   Td 02/11/2004   Tdap 10/23/2009   Zoster, Live 06/04/2012, 03/23/2021    Past Medical History:  Diagnosis Date   Aortic atherosclerosis (HCC)    Arthritis    Arthritis -DDD."chronic pain med used"   Atrial fibrillation (HCC)    Bladder tumor    states that it was cancer   Cancer (HCC)    Bladder cancer"-Dr.Ottelin- checks every 3 months--no signs last checks.   Chronic knee pain RIGHT KNEE --  S/P KNEE REPLACEMENT JAN 2012--  PT STATES  NEEDS ANOTHER REPLACEMENT DUE TO JOINT RECALL   Complication of anesthesia EMERGENCE DELIRIUM   occurred x1- 2 yrs ago.few surgeries ago, none recently   COPD (chronic obstructive pulmonary disease) (HCC)    Coronary artery disease    Depression    Diverticulosis    DM type 2 (diabetes mellitus, type 2) (HCC)    novolog BID and oral agents   GERD (gastroesophageal reflux disease)    H/O hiatal hernia    Headache    History of bladder cancer TCC OF BLADDER  --  FOLLOWED BY DR Vernie Ammons   History of peptic ulcer AGE 72   Hypertension    pt denies, takes lisimopril for kidneys   Internal hemorrhoids    Mild obstructive sleep apnea    CPAP   New onset a-fib (HCC) 02/28/2022   with RVR, during hospitalization, will use 30 day heart monitor s/p urology surgery   Nocturia    Peripheral neuropathy BOTTOM RIGHT FOOT   Seasonal allergies    Short of breath on exertion    Urgency of urination      Tobacco History: Social History   Tobacco Use  Smoking Status Some Days   Current packs/day: 0.00   Average packs/day: 1 pack/day for 60.0 years (60.0 ttl pk-yrs)   Types: Cigarettes   Start date: 03/18/1962   Last attempt to quit: 03/18/2022   Years since quitting: 0.5  Smokeless Tobacco Never  Tobacco Comments   Smokes 5 cigarettes/day-10/08/22   Ready to quit: Not Answered Counseling given: Not Answered Tobacco comments: Smokes 5 cigarettes/day-10/08/22   Outpatient Medications Prior to Visit  Medication Sig Dispense Refill   albuterol (VENTOLIN HFA) 108 (90 Base) MCG/ACT inhaler Inhale 2 puffs into the lungs every 4 (four) hours as needed for wheezing or shortness of breath. 1 each 5   aspirin EC 81 MG tablet Take 81 mg by mouth 4 (four) times a week. Swallow whole.     buPROPion (WELLBUTRIN SR) 150 MG 12 hr tablet Take 1 tablet by mouth 2 (two) times daily.  diclofenac Sodium (VOLTAREN ARTHRITIS PAIN) 1 % GEL Apply 2 g topically 2 (two) times daily as needed (pain).     docusate sodium (COLACE) 100 MG capsule Take 100 mg by mouth daily as needed for mild constipation.     esomeprazole (NEXIUM) 40 MG capsule Take 1 capsule (40 mg total) by mouth daily before breakfast. 90 capsule 2   ezetimibe (ZETIA) 10 MG tablet Take 10 mg by mouth at bedtime.     hyoscyamine (LEVSIN) 0.125 MG tablet Take 0.125 mg by mouth every 8 (eight) hours as needed for cramping.     insulin aspart protamine- aspart (NOVOLOG MIX 70/30) (70-30) 100 UNIT/ML injection Inject 55 Units into the skin 2 (two) times daily.     ipratropium-albuterol (DUONEB) 0.5-2.5 (3) MG/3ML SOLN Take 3 mLs by nebulization every 4 (four) hours as needed. 360 mL 1   levalbuterol (XOPENEX) 0.63 MG/3ML nebulizer solution Take 3 mLs (0.63 mg total) by nebulization every 6 (six) hours as needed for wheezing or shortness of breath. 75 mL 5   lisinopril (PRINIVIL,ZESTRIL) 10 MG tablet Take 10 mg by mouth every morning.     metFORMIN  (GLUCOPHAGE) 1000 MG tablet Take 1,000 mg by mouth 2 (two) times daily.     NOVOLIN 70/30 (70-30) 100 UNIT/ML injection PLEASE SEE ATTACHED FOR DETAILED DIRECTIONS     Oxycodone HCl 10 MG TABS Take 10 mg by mouth every 6 (six) hours as needed (pain).     pravastatin (PRAVACHOL) 40 MG tablet Take 40 mg by mouth daily.     pregabalin (LYRICA) 150 MG capsule Take 150 mg by mouth 2 (two) times daily.     senna-docusate (SENOKOT-S) 8.6-50 MG tablet Take 1 tablet by mouth 2 (two) times daily between meals as needed for moderate constipation.  0   tamsulosin (FLOMAX) 0.4 MG CAPS capsule Take 0.4 mg by mouth every morning.     traZODone (DESYREL) 100 MG tablet Take 100 mg by mouth at bedtime.     trospium (SANCTURA) 20 MG tablet Take 20 mg by mouth 2 (two) times daily.     Budeson-Glycopyrrol-Formoterol (BREZTRI AEROSPHERE) 160-9-4.8 MCG/ACT AERO Inhale 2 puffs into the lungs in the morning and at bedtime. (Patient not taking: Reported on 10/08/2022) 10.7 g 5   Budeson-Glycopyrrol-Formoterol (BREZTRI AEROSPHERE) 160-9-4.8 MCG/ACT AERO Inhale 2 puffs into the lungs in the morning and at bedtime. 10.7 g 28   oxybutynin (DITROPAN) 5 MG tablet 1 p.o. every 6 hours as needed bladder spasms/bladder pain 15 tablet 1   No facility-administered medications prior to visit.     Review of Systems:   Constitutional: No weight loss or gain, night sweats, chills, fatigue, lassitude, fevers  HEENT: No headaches, difficulty swallowing, tooth/dental problems, or sore throat. No sneezing, itching, ear ache, nasal congestion, or post nasal drip CV:  No chest pain, orthopnea, PND, swelling in lower extremities, anasarca, dizziness, palpitations, syncope Resp: +shortness of breath with exertion (improved). No chest congestion, wheezing. No cough. No hemoptysis. No chest wall deformity GI:  No heartburn, indigestion GU: No dysuria, change in color of urine, urgency or frequency.   Skin: No rash, lesions, ulcerations MSK:   No joint pain or swelling.   Neuro: No dizziness or lightheadedness.  Psych: No depression or anxiety. Mood stable.     Physical Exam:  BP (!) 140/68 (BP Location: Right Arm, Patient Position: Sitting, Cuff Size: Large)   Pulse 80   Temp 98.3 F (36.8 C) (Oral)   Ht 5' 10.5" (  1.791 m)   Wt 261 lb 9.6 oz (118.7 kg)   SpO2 98%   BMI 37.01 kg/m   GEN: Pleasant, interactive, well-appearing; in no acute distress HEENT:  Normocephalic and atraumatic. PERRLA. Sclera white. Nasal turbinates pink, moist and patent bilaterally. No rhinorrhea present. Oropharynx pink and moist, without exudate or edema. No lesions, ulcerations, or postnasal drip.  NECK:  Supple w/ fair ROM. No JVD present. Normal carotid impulses w/o bruits. Thyroid symmetrical with no goiter or nodules palpated. No lymphadenopathy.   CV: RRR, no m/r/g, no peripheral edema. Pulses intact, +2 bilaterally. No cyanosis, pallor or clubbing. PULMONARY:  Unlabored, regular breathing. Clear bilaterally A&P w/o wheezes/rales/rhonchi. No accessory muscle use.  GI: BS present and normoactive. Soft, non-tender to palpation. No organomegaly or masses detected.  MSK: No erythema, warmth or tenderness. Cap refil <2 sec all extrem. No deformities or joint swelling noted.  Neuro: A/Ox3. No focal deficits noted.   Skin: Warm, no lesions or rashe Psych: Normal affect and behavior. Judgement and thought content appropriate.     Lab Results:  CBC    Component Value Date/Time   WBC 7.4 03/29/2022 1129   RBC 4.57 03/29/2022 1129   HGB 10.6 (L) 03/29/2022 1129   HCT 33.5 (L) 03/29/2022 1129   PLT 180 03/29/2022 1129   MCV 73.3 (L) 03/29/2022 1129   MCH 23.2 (L) 03/29/2022 1129   MCHC 31.6 03/29/2022 1129   RDW 15.9 (H) 03/29/2022 1129   LYMPHSABS 1.5 03/29/2022 1129   MONOABS 0.4 03/29/2022 1129   EOSABS 0.0 03/29/2022 1129   BASOSABS 0.0 03/29/2022 1129    BMET    Component Value Date/Time   NA 133 (L) 03/29/2022 1129   K 3.9  03/29/2022 1129   CL 101 03/29/2022 1129   CO2 19 (L) 03/29/2022 1129   GLUCOSE 239 (H) 03/29/2022 1129   BUN 26 (H) 03/29/2022 1129   CREATININE 1.01 03/29/2022 1129   CALCIUM 8.9 03/29/2022 1129   GFRNONAA >60 03/29/2022 1129   GFRAA >60 04/09/2018 0235    BNP    Component Value Date/Time   BNP 36.0 03/29/2022 1129     Imaging:  No results found.  Administration History     None          Latest Ref Rng & Units 02/02/2021   10:51 AM  PFT Results  FVC-Pre L 3.54   FVC-Predicted Pre % 82   FVC-Post L 3.68   FVC-Predicted Post % 85   Pre FEV1/FVC % % 69   Post FEV1/FCV % % 71   FEV1-Pre L 2.43   FEV1-Predicted Pre % 77   FEV1-Post L 2.60   DLCO uncorrected ml/min/mmHg 22.86   DLCO UNC% % 90   DLCO corrected ml/min/mmHg 22.73   DLCO COR %Predicted % 89   DLVA Predicted % 90   TLC L 7.28   TLC % Predicted % 105   RV % Predicted % 156     No results found for: "NITRICOXIDE"      Assessment & Plan:   COPD, moderate (HCC) Clinically improved. Continued smoking cessation encouraged. Discussed the importance of maintenance therapy. Will resume his Breztri, if covered. If not, will find alternative therapy options that are affordable. Action plan in place. Encouraged to remain active.  Patient Instructions  Restart Breztri 2 puffs Twice daily. Brush tongue and rinse mouth afterwards. Call me if this is still expensive  Continue Albuterol inhaler 2 puffs or 3 mL neb every 6 hours  as needed for shortness of breath or wheezing. Notify if symptoms persist despite rescue inhaler/neb use.  Continue CPAP nightly, minimum of 4-6 hours.   Congrats on cutting back on smoking!! Keep up the strong work    Follow up in 4 months with Dr. Celine Mans. If symptoms worsen, please contact office for sooner follow up or seek emergency care.   OSA (obstructive sleep apnea) Excellent compliance. Aware of safe driving practices. Encouraged continued nightly use.   Tobacco  use Cut back from 2 ppd to 5 cigarettes. Smoking cessation advised. Referral to lung cancer screening program - next due 02/2023.    I spent 31 minutes of dedicated to the care of this patient on the date of this encounter to include pre-visit review of records, face-to-face time with the patient discussing conditions above, post visit ordering of testing, clinical documentation with the electronic health record, making appropriate referrals as documented, and communicating necessary findings to members of the patients care team.  Noemi Chapel, NP 10/08/2022  Pt aware and understands NP's role.

## 2022-10-10 DIAGNOSIS — E1142 Type 2 diabetes mellitus with diabetic polyneuropathy: Secondary | ICD-10-CM | POA: Diagnosis not present

## 2022-10-10 DIAGNOSIS — Z92241 Personal history of systemic steroid therapy: Secondary | ICD-10-CM | POA: Diagnosis not present

## 2022-10-10 DIAGNOSIS — I700 Atherosclerosis of aorta: Secondary | ICD-10-CM | POA: Diagnosis not present

## 2022-10-10 DIAGNOSIS — I10 Essential (primary) hypertension: Secondary | ICD-10-CM | POA: Diagnosis not present

## 2022-10-10 DIAGNOSIS — Z794 Long term (current) use of insulin: Secondary | ICD-10-CM | POA: Diagnosis not present

## 2022-10-14 DIAGNOSIS — C678 Malignant neoplasm of overlapping sites of bladder: Secondary | ICD-10-CM | POA: Diagnosis not present

## 2022-10-14 DIAGNOSIS — N3941 Urge incontinence: Secondary | ICD-10-CM | POA: Diagnosis not present

## 2022-10-16 ENCOUNTER — Ambulatory Visit
Admission: RE | Admit: 2022-10-16 | Discharge: 2022-10-16 | Disposition: A | Discharge status: Home / Self Care | Payer: PPO | Source: Ambulatory Visit | Attending: Family Medicine | Admitting: Family Medicine

## 2022-10-16 DIAGNOSIS — R109 Unspecified abdominal pain: Secondary | ICD-10-CM

## 2022-10-16 DIAGNOSIS — R1011 Right upper quadrant pain: Secondary | ICD-10-CM | POA: Diagnosis not present

## 2022-10-22 DIAGNOSIS — J449 Chronic obstructive pulmonary disease, unspecified: Secondary | ICD-10-CM | POA: Diagnosis not present

## 2022-10-24 DIAGNOSIS — M25561 Pain in right knee: Secondary | ICD-10-CM | POA: Diagnosis not present

## 2022-10-24 DIAGNOSIS — M62830 Muscle spasm of back: Secondary | ICD-10-CM | POA: Diagnosis not present

## 2022-10-24 DIAGNOSIS — M47817 Spondylosis without myelopathy or radiculopathy, lumbosacral region: Secondary | ICD-10-CM | POA: Diagnosis not present

## 2022-10-24 DIAGNOSIS — G894 Chronic pain syndrome: Secondary | ICD-10-CM | POA: Diagnosis not present

## 2022-10-24 DIAGNOSIS — Z79891 Long term (current) use of opiate analgesic: Secondary | ICD-10-CM | POA: Diagnosis not present

## 2022-11-12 DIAGNOSIS — J449 Chronic obstructive pulmonary disease, unspecified: Secondary | ICD-10-CM | POA: Diagnosis not present

## 2022-11-22 DIAGNOSIS — J449 Chronic obstructive pulmonary disease, unspecified: Secondary | ICD-10-CM | POA: Diagnosis not present

## 2022-12-02 DIAGNOSIS — H52222 Regular astigmatism, left eye: Secondary | ICD-10-CM | POA: Diagnosis not present

## 2022-12-02 DIAGNOSIS — H5202 Hypermetropia, left eye: Secondary | ICD-10-CM | POA: Diagnosis not present

## 2022-12-02 DIAGNOSIS — H02839 Dermatochalasis of unspecified eye, unspecified eyelid: Secondary | ICD-10-CM | POA: Diagnosis not present

## 2022-12-02 DIAGNOSIS — H04123 Dry eye syndrome of bilateral lacrimal glands: Secondary | ICD-10-CM | POA: Diagnosis not present

## 2022-12-02 DIAGNOSIS — E119 Type 2 diabetes mellitus without complications: Secondary | ICD-10-CM | POA: Diagnosis not present

## 2022-12-02 DIAGNOSIS — H2511 Age-related nuclear cataract, right eye: Secondary | ICD-10-CM | POA: Diagnosis not present

## 2022-12-02 DIAGNOSIS — H2512 Age-related nuclear cataract, left eye: Secondary | ICD-10-CM | POA: Diagnosis not present

## 2022-12-02 DIAGNOSIS — H31092 Other chorioretinal scars, left eye: Secondary | ICD-10-CM | POA: Diagnosis not present

## 2022-12-02 DIAGNOSIS — H43811 Vitreous degeneration, right eye: Secondary | ICD-10-CM | POA: Diagnosis not present

## 2022-12-03 DIAGNOSIS — H2511 Age-related nuclear cataract, right eye: Secondary | ICD-10-CM | POA: Diagnosis not present

## 2022-12-19 DIAGNOSIS — G894 Chronic pain syndrome: Secondary | ICD-10-CM | POA: Diagnosis not present

## 2022-12-19 DIAGNOSIS — M47817 Spondylosis without myelopathy or radiculopathy, lumbosacral region: Secondary | ICD-10-CM | POA: Diagnosis not present

## 2022-12-19 DIAGNOSIS — M62830 Muscle spasm of back: Secondary | ICD-10-CM | POA: Diagnosis not present

## 2022-12-19 DIAGNOSIS — M25561 Pain in right knee: Secondary | ICD-10-CM | POA: Diagnosis not present

## 2022-12-23 DIAGNOSIS — H2511 Age-related nuclear cataract, right eye: Secondary | ICD-10-CM | POA: Diagnosis not present

## 2023-01-02 DIAGNOSIS — H02839 Dermatochalasis of unspecified eye, unspecified eyelid: Secondary | ICD-10-CM | POA: Diagnosis not present

## 2023-01-02 DIAGNOSIS — H43811 Vitreous degeneration, right eye: Secondary | ICD-10-CM | POA: Diagnosis not present

## 2023-01-02 DIAGNOSIS — E119 Type 2 diabetes mellitus without complications: Secondary | ICD-10-CM | POA: Diagnosis not present

## 2023-01-02 DIAGNOSIS — H2511 Age-related nuclear cataract, right eye: Secondary | ICD-10-CM | POA: Diagnosis not present

## 2023-01-02 DIAGNOSIS — H04123 Dry eye syndrome of bilateral lacrimal glands: Secondary | ICD-10-CM | POA: Diagnosis not present

## 2023-01-02 DIAGNOSIS — H31092 Other chorioretinal scars, left eye: Secondary | ICD-10-CM | POA: Diagnosis not present

## 2023-01-07 ENCOUNTER — Ambulatory Visit: Payer: PPO | Admitting: Podiatry

## 2023-01-13 ENCOUNTER — Other Ambulatory Visit: Payer: Self-pay | Admitting: Nurse Practitioner

## 2023-01-15 ENCOUNTER — Encounter: Payer: Self-pay | Admitting: *Deleted

## 2023-01-30 ENCOUNTER — Telehealth: Payer: Self-pay | Admitting: Nurse Practitioner

## 2023-01-30 NOTE — Telephone Encounter (Signed)
Patient cannot afford medication and would like Breztri samples. Please call and when they become available [phone]

## 2023-02-03 NOTE — Telephone Encounter (Signed)
Called pt, no answer lvmm

## 2023-02-03 NOTE — Telephone Encounter (Signed)
Atc pt no answer x2

## 2023-02-04 NOTE — Telephone Encounter (Signed)
Pt came in the office yesterday and received samples nfn

## 2023-02-13 DIAGNOSIS — G894 Chronic pain syndrome: Secondary | ICD-10-CM | POA: Diagnosis not present

## 2023-02-13 DIAGNOSIS — M47817 Spondylosis without myelopathy or radiculopathy, lumbosacral region: Secondary | ICD-10-CM | POA: Diagnosis not present

## 2023-02-13 DIAGNOSIS — M25561 Pain in right knee: Secondary | ICD-10-CM | POA: Diagnosis not present

## 2023-02-13 DIAGNOSIS — M62830 Muscle spasm of back: Secondary | ICD-10-CM | POA: Diagnosis not present

## 2023-02-27 DIAGNOSIS — M1712 Unilateral primary osteoarthritis, left knee: Secondary | ICD-10-CM | POA: Diagnosis not present

## 2023-04-15 ENCOUNTER — Other Ambulatory Visit: Payer: Self-pay | Admitting: Nurse Practitioner

## 2023-04-15 ENCOUNTER — Other Ambulatory Visit: Payer: Self-pay

## 2023-04-21 ENCOUNTER — Ambulatory Visit: Payer: PPO | Admitting: Internal Medicine

## 2023-04-25 ENCOUNTER — Telehealth: Payer: Self-pay

## 2023-04-25 NOTE — Telephone Encounter (Signed)
   Name: Michael Horn  DOB: Feb 15, 1951  MRN: [id number]  Primary Cardiologist: Lance Muss, MD  Chart reviewed as part of pre-operative protocol coverage. Because of Marilyn Wing Almas's past medical history and time since last visit, he will require a follow-up in-office visit in order to better assess preoperative cardiovascular risk.  Please schedule for new cardiologist to reestablish care.  He is a former patient of Dr. Hoyle Barr and was last seen 06/06/2022.  Pre-op covering staff: - Please schedule appointment and call patient to inform them. If patient already had an upcoming appointment within acceptable timeframe, please add "pre-op clearance" to the appointment notes so provider is aware. - Please contact requesting surgeon's office via preferred method (i.e, phone, fax) to inform them of need for appointment prior to surgery.    Napoleon Form, Leodis Rains, NP  04/25/2023, 2:54 PM

## 2023-04-25 NOTE — Telephone Encounter (Signed)
   Pre-operative Risk Assessment    Patient Name: Michael Horn  DOB: [birth year] MRN: [id number]   Date of last office visit: 06/06/22 Dr. Lance Muss Date of next office visit: None   Request for Surgical Clearance    Procedure:   Left Total Knee Arthroplasty  Date of Surgery:  Clearance TBD                                Surgeon:  Dr. Garnette Gunner Surgeon's Group or Practice Name:  [facility] Phone number:  [phone] Fax number:  [phone]   Type of Clearance Requested:   - Medical  - Pharmacy:  Hold Aspirin     Type of Anesthesia:  General    Additional requests/questions:    SignedCathlean Cower Tayvin Preslar   04/25/2023, 2:49 PM

## 2023-04-25 NOTE — Telephone Encounter (Signed)
Called patient to set up an pre-op in office on 05/12/23 @ 1:55.

## 2023-04-28 NOTE — Progress Notes (Unsigned)
Cardiology Office Note    Patient Name: Michael Horn Date of Encounter: 04/28/2023  Primary Care Provider:  Lupita Raider, MD Primary Cardiologist:  Lance Muss, MD Primary Electrophysiologist: None   Past Medical History    Past Medical History:  Diagnosis Date   Aortic atherosclerosis [facility])    Arthritis    Arthritis -DDD."chronic pain med used"   Atrial fibrillation [facility])    Bladder tumor    states that it was cancer   Cancer [facility])    Bladder cancer"-Dr.Ottelin- checks every 3 months--no signs last checks.   Chronic knee pain RIGHT KNEE --  S/P KNEE REPLACEMENT JAN 2012--  PT STATES  NEEDS ANOTHER REPLACEMENT DUE TO JOINT RECALL   Complication of anesthesia EMERGENCE DELIRIUM   occurred x1- 2 yrs ago.few surgeries ago, none recently   COPD (chronic obstructive pulmonary disease) (HCC)    Coronary artery disease    Depression    Diverticulosis    DM type 2 (diabetes mellitus, type 2) (HCC)    novolog BID and oral agents   GERD (gastroesophageal reflux disease)    H/O hiatal hernia    Headache    History of bladder cancer TCC OF BLADDER  --  FOLLOWED BY DR Vernie Ammons   History of peptic ulcer AGE 35   Hypertension    pt denies, takes lisimopril for kidneys   Internal hemorrhoids    Mild obstructive sleep apnea    CPAP   New onset a-fib (HCC) 02/28/2022   with RVR, during hospitalization, will use 30 day heart monitor s/p urology surgery   Nocturia    Peripheral neuropathy BOTTOM RIGHT FOOT   Seasonal allergies    Short of breath on exertion    Urgency of urination     History of Present Illness  Michael Horn is a 73 y.o. male with a PMH of paroxysmal AF, HTN, HLD, DM type II, COPD, tobacco abuse, OSA (on CPAP), bladder CA, morbid obesity who presents today for preoperative clearance.  Michael Horn was previously followed by Dr. Eldridge Dace for evaluation of chest pain and was seen initially in 08/2021.  He endorsed right-sided chest pain that occurred  randomly with no triggers.  He had a previous LHC completed in 2009 that showed nonobstructive disease.  He was seen in the ED with complaint of SOB and 2D echo was completed with EF of 60 to 65% no RWMA mild concentric LVH with grade 1 DD large RV.  He completed a Lexiscan Myoview prior to bladder surgery that was normal.  He was last seen by Dr. Eldridge Dace on 06/06/2022 with complaint of atypical right sided CP that has been ongoing for years.  Michael Horn presents today for follow up and preoperative clearance.  He reports doing well since his previous follow-up visit with no new cardiac complaints.  He reports no recurrence of right-sided chest pain in the past 6 to 8 months.  His blood pressure today was stable at 120/58 and heart rate was 86 bpm.  He reports no recurrence of atrial fibrillation or palpitations since his previous follow-up. He remains active despite joint pain, riding an exercise bike for about a mile at a time and performing light work around the house and on his tractor. However, his activity is limited by osteoarthritis, with a previous knee replacement in 2012 and a reverse shoulder replacement. He also reports occasional swelling in his fingers, particularly after periods of inactivity. His last stress test, prior to a bladder surgery, showed  no concerning results. He is currently on baby aspirin, which he will hold for seven days before his upcoming procedure.   Patient denies chest pain, palpitations, dyspnea, PND, orthopnea, nausea, vomiting, dizziness, syncope, edema, weight gain, or early satiety.  Review of Systems  Please see the history of present illness.    All other systems reviewed and are otherwise negative except as noted above.  Physical Exam    Wt Readings from Last 3 Encounters:  10/08/22 261 lb 9.6 oz (118.7 kg)  06/06/22 272 lb 6.4 oz (123.6 kg)  04/12/22 270 lb (122.5 kg)   ZO:XWRUE were no vitals filed for this visit.,There is no height or weight on file  to calculate BMI. GEN: Well nourished, well developed in no acute distress Neck: No JVD; No carotid bruits Pulmonary: Clear to auscultation without rales, wheezing or rhonchi  Cardiovascular: Normal rate. Regular rhythm. Normal S1. Normal S2.   Murmurs: There is no murmur.  ABDOMEN: Soft, non-tender, non-distended EXTREMITIES:  No edema; No deformity   EKG/LABS/ Recent Cardiac Studies   ECG personally reviewed by me today -sinus rhythm with rate of 86 bpm and no acute changes consistent with previous EKG.  Risk Assessment/Calculations:      Lab Results  Component Value Date   WBC 7.4 03/29/2022   HGB 10.6 (L) 03/29/2022   HCT 33.5 (L) 03/29/2022   MCV 73.3 (L) 03/29/2022   PLT 180 03/29/2022   Lab Results  Component Value Date   CREATININE 1.01 03/29/2022   BUN 26 (H) 03/29/2022   NA 133 (L) 03/29/2022   K 3.9 03/29/2022   CL 101 03/29/2022   CO2 19 (L) 03/29/2022   No results found for: "CHOL", "HDL", "LDLCALC", "LDLDIRECT", "TRIG", "CHOLHDL"  Lab Results  Component Value Date   HGBA1C 7.2 (H) 04/01/2018   Assessment & Plan    1.  Preoperative clearance: -Patient's RCRI score is 0.9% The patient affirms he has been doing well without any new cardiac symptoms. They are able to achieve 7 METS without cardiac limitations. Therefore, based on ACC/AHA guidelines, the patient would be at acceptable risk for the planned procedure without further cardiovascular testing. The patient was advised that if he develops new symptoms prior to surgery to contact our office to arrange for a follow-up visit, and he verbalized understanding.   Patient can hold ASA 81 mg 7 days prior to procedure and should restart postprocedure when surgically safe and advised by performing provider.  2.  Essential hypertension: -Patient's blood pressure today was stable at 120/58 -Continue lisinopril 10 mg daily  3.  Hyperlipidemia: -Patient's last LDL cholesterol was 47 on 4/54/0981 -Continue  ezetimibe 10 mg daily pravastatin 40 mg daily  4.  History of COPD: -Currently followed by pulmonology with smoking cessation advised -Continue current treatment plan per pulmonology  5.  Paroxysmal AF: Acutely occurred during upper respiratory infection no recurrence and is currently not on anticoagulation. -Patient reports no recurrence of palpitations.  Disposition: Follow-up with New Cardiologist or APP in 12 months   Signed, Napoleon Form, Leodis Rains, NP 04/28/2023, 12:41 PM [facility]

## 2023-04-30 ENCOUNTER — Ambulatory Visit: Payer: HMO | Attending: Nurse Practitioner | Admitting: Nurse Practitioner

## 2023-04-30 ENCOUNTER — Encounter: Payer: Self-pay | Admitting: Nurse Practitioner

## 2023-04-30 VITALS — BP 120/58 | HR 86 | Ht 70.0 in | Wt 272.4 lb

## 2023-04-30 DIAGNOSIS — I4891 Unspecified atrial fibrillation: Secondary | ICD-10-CM | POA: Diagnosis not present

## 2023-04-30 DIAGNOSIS — E782 Mixed hyperlipidemia: Secondary | ICD-10-CM

## 2023-04-30 DIAGNOSIS — E1169 Type 2 diabetes mellitus with other specified complication: Secondary | ICD-10-CM | POA: Diagnosis not present

## 2023-04-30 DIAGNOSIS — I10 Essential (primary) hypertension: Secondary | ICD-10-CM

## 2023-04-30 DIAGNOSIS — Z01810 Encounter for preprocedural cardiovascular examination: Secondary | ICD-10-CM

## 2023-04-30 DIAGNOSIS — Z794 Long term (current) use of insulin: Secondary | ICD-10-CM

## 2023-04-30 NOTE — Patient Instructions (Addendum)
Medication Instructions:  No changes   *If you need a refill on your cardiac medications before your next appointment, please call your pharmacy*   Lab Work: Not needed   Testing/Procedures: Not needed   Follow-Up: At [facility], you and your health needs are our priority.  As part of our continuing mission to provide you with exceptional heart care, we have created designated Provider Care Teams.  These Care Teams include your primary Cardiologist (physician) and Advanced Practice Providers (APPs -  Physician Assistants and Nurse Practitioners) who all work together to provide you with the care you need, when you need it.     Your next appointment:   12 month(s)  The format for your next appointment:   In Person  Provider:    Dr Lynnette Caffey  or Dr Clifton James     Other Instructions     You are cleared from a cardiac standpoint to have your upcoming procedrure

## 2023-05-05 ENCOUNTER — Telehealth: Payer: Self-pay | Admitting: Internal Medicine

## 2023-05-05 NOTE — Telephone Encounter (Signed)
 Fax received from Dr. Jeanice Lim with [facility] to perform a left total knee arthroplasty on patient.  Patient needs surgery clearance. Surgery is pending. Patient was seen on 09/28/22. Office protocol is a risk assessment can be sent to surgeon if patient has been seen in 60 days or less.   Pt has ov on 06/11/23 with Dr Celine Mans  I spoke with him and let him know to be sure and keep this appt  He verbalized understanding  Will route back to clearance pool for now

## 2023-05-12 ENCOUNTER — Ambulatory Visit: Payer: HMO | Admitting: Nurse Practitioner

## 2023-06-02 DIAGNOSIS — M62830 Muscle spasm of back: Secondary | ICD-10-CM | POA: Diagnosis not present

## 2023-06-02 DIAGNOSIS — M47817 Spondylosis without myelopathy or radiculopathy, lumbosacral region: Secondary | ICD-10-CM | POA: Diagnosis not present

## 2023-06-02 DIAGNOSIS — G894 Chronic pain syndrome: Secondary | ICD-10-CM | POA: Diagnosis not present

## 2023-06-02 DIAGNOSIS — M25561 Pain in right knee: Secondary | ICD-10-CM | POA: Diagnosis not present

## 2023-06-10 ENCOUNTER — Other Ambulatory Visit: Payer: Self-pay | Admitting: Nurse Practitioner

## 2023-06-11 ENCOUNTER — Encounter: Payer: Self-pay | Admitting: Internal Medicine

## 2023-06-11 ENCOUNTER — Ambulatory Visit: Payer: PPO | Admitting: Internal Medicine

## 2023-06-11 VITALS — BP 128/64 | HR 83 | Ht 70.0 in | Wt 273.4 lb

## 2023-06-11 DIAGNOSIS — Z716 Tobacco abuse counseling: Secondary | ICD-10-CM

## 2023-06-11 DIAGNOSIS — J449 Chronic obstructive pulmonary disease, unspecified: Secondary | ICD-10-CM | POA: Diagnosis not present

## 2023-06-11 DIAGNOSIS — F17200 Nicotine dependence, unspecified, uncomplicated: Secondary | ICD-10-CM

## 2023-06-11 MED ORDER — BREZTRI AEROSPHERE 160-9-4.8 MCG/ACT IN AERO: 2.0000 | INHALATION_SPRAY | Freq: Two times a day (BID) | RESPIRATORY_TRACT | 11 refills | Status: AC

## 2023-06-11 MED ORDER — VARENICLINE TARTRATE 1 MG PO TABS: 1.0000 mg | ORAL_TABLET | Freq: Two times a day (BID) | ORAL | 3 refills | Status: AC

## 2023-06-11 NOTE — Patient Instructions (Addendum)
 It was a pleasure to see you today!  Please schedule follow up with myself in 3 months.  If my schedule is not open yet, we will contact you with a reminder closer to that time. Please call [phone] if you haven't heard from [place] a month before, and always call us sooner if issues or concerns arise. You can also send [place] a message through MyChart, but but aware that this is not to be used for urgent issues and it may take up to 5-7 days to receive a reply. Please be aware that you will likely be able to view your results before I have a chance to respond to them. Please give [place] 5 business days to respond to any non-urgent results.    VISIT SUMMARY:  During your visit, we discussed your worsening breathing and smoking relapse, as well as your ongoing issues with sleep apnea and acid reflux. We reviewed your current medications and made some adjustments to help manage your symptoms more effectively.  YOUR PLAN:  -CHRONIC OBSTRUCTIVE PULMONARY DISEASE (COPD): COPD is a chronic lung condition that makes it hard to breathe. To help manage your COPD, we discussed the importance of quitting smoking and prescribed varenicline to assist with this. Please ensure you use your Breztri inhaler consistently, two puffs in the morning and two at night, and use your albuterol rescue inhaler as needed. We will repeat your pulmonary function tests and conduct an ambulatory desaturation study. You will also be enrolled in a lung cancer screening program. If your oxygen levels drop below 88% with exertion, we may consider home oxygen therapy.  -OBSTRUCTIVE SLEEP APNEA (OSA): OSA is a condition where your breathing stops and starts during sleep. It is important to use your CPAP machine to manage this condition. We also discussed improving your sleep hygiene by using your bed only for sleep.  -GASTROESOPHAGEAL REFLUX DISEASE (GERD): GERD is a condition where stomach acid frequently flows back into the tube connecting  your mouth and stomach, causing heartburn. To help manage your GERD, continue taking omeprazole daily and make dietary changes such as avoiding caffeine, alcohol, carbonated beverages, artificial sweeteners, fried foods, spicy foods, tomato-based foods, and chocolate. We also encourage you to work on weight loss.  INSTRUCTIONS:  Please follow up with the pulmonary function tests and ambulatory desaturation study as ordered. Enroll in the lung cancer screening program. Use your CPAP machine regularly and follow the dietary recommendations for managing GERD. If you have any questions or concerns, please contact our office.

## 2023-06-11 NOTE — Progress Notes (Signed)
 Michael Horn    [id number]    1950/04/05  Primary Care Physician:Shaw, Rockney Ghee, MD Date of Appointment: 06/11/2023 Established Patient Visit  Chief complaint:   Chief Complaint  Patient presents with   Follow-up    Breathing is getting worse.     HPI: Michael Horn is a 73 y.o. man with tobacco use disorder and COPD. Also has OSA on CPAP.  Interval Updates: Michael Horn "Michael Horn" is a 72 year old male with COPD who presents with worsening breathing and smoking relapse.  He has experienced worsening breathing since resuming smoking after a four and a half to five-month cessation. He previously quit smoking with the assistance of a quit line, which provided support packages. Despite the relapse, he enjoys smoking and has a designated smoking area in his home.  He is currently using a Breztri inhaler, two puffs in the morning and two at night, but forgets doses. He finds it helpful when used regularly. He also uses an albuterol rescue inhaler two to three times a week. He has not been hospitalized for COPD exacerbations in the past year and has not required prednisone or antibiotics. He was last seen in July of the previous year and has a history of mild COPD based on testing from 2022.  He is not currently using his CPAP machine, which he previously used faithfully, due to discomfort and concerns about his hair. He reports poor sleep hygiene, often sleeping in a chair while watching TV and waking up with swollen feet.  He has a history of diverticulitis, for which he underwent surgery in 1993 to remove ten and a half inches of his colon. He reports ongoing stomach pain, especially after eating, and uses Pepto Bismol for relief. He experiences heartburn and takes omeprazole daily. He reports coughing with white and sometimes green phlegm throughout the day, which he attributes to reflux.  He has a history of knee issues, having had surgery on one knee in 2012, which  resulted in ongoing problems. He was considering surgery on the other knee but decided against it, opting instead for periodic fluid drainage every six months, which provides relief.   I have reviewed the patient's family social and past medical history and updated as appropriate.   Past Medical History:  Diagnosis Date   Aortic atherosclerosis (HCC)    Arthritis    Arthritis -DDD."chronic pain med used"   Atrial fibrillation (HCC)    Bladder tumor    states that it was cancer   Cancer [facility])    Bladder cancer"-Dr.Ottelin- checks every 3 months--no signs last checks.   Chronic knee pain RIGHT KNEE --  S/P KNEE REPLACEMENT JAN 2012--  PT STATES  NEEDS ANOTHER REPLACEMENT DUE TO JOINT RECALL   Complication of anesthesia EMERGENCE DELIRIUM   occurred x1- 2 yrs ago.few surgeries ago, none recently   COPD (chronic obstructive pulmonary disease) (HCC)    Coronary artery disease    Depression    Diverticulosis    DM type 2 (diabetes mellitus, type 2) (HCC)    novolog BID and oral agents   GERD (gastroesophageal reflux disease)    H/O hiatal hernia    Headache    History of bladder cancer TCC OF BLADDER  --  FOLLOWED BY DR Vernie Ammons   History of peptic ulcer AGE 39   Hypertension    pt denies, takes lisimopril for kidneys   Internal hemorrhoids    Mild obstructive sleep  apnea    CPAP   New onset a-fib (HCC) 02/28/2022   with RVR, during hospitalization, will use 30 day heart monitor s/p urology surgery   Nocturia    Peripheral neuropathy BOTTOM RIGHT FOOT   Seasonal allergies    Short of breath on exertion    Urgency of urination     Past Surgical History:  Procedure Laterality Date   BACK SURGERY  X2   lower back   BILATERAL KNEE ARTHROSCOPY     BILATERAL SHOULDER SURG.  2003   CARDIAC CATHETERIZATION  04-13-2007   ---- DR Eldridge Dace   NO SIGNIFICANT CAD/ NORMAL LVF   CARPAL TUNNEL RELEASE  04/11/2006   RIGHT   COLONOSCOPY WITH PROPOFOL N/A 05/01/2015   Procedure:  COLONOSCOPY WITH PROPOFOL;  Surgeon: Charolett Bumpers, MD;  Location: [facility];  Service: Endoscopy;  Laterality: N/A;   CYSTOSCOPY W/ RETROGRADES Bilateral 12/23/2019   Procedure: CYSTOSCOPY WITH RETROGRADE PYELOGRAM;  Surgeon: Marcine Matar, MD;  Location: [facility];  Service: Urology;  Laterality: Bilateral;   HAND SURGERY Right    cyst removed   LEFT CARPAL. TUNNEL RELEASE     LEFT EYE SURG.   2008   REMOVAL OF BB   REPAIR RECURRENT LEFT ROTATOR CUFF TEAR  01/02/2011   REVERSE SHOULDER ARTHROPLASTY Left 04/08/2018   Procedure: REVERSE SHOULDER ARTHROPLASTY;  Surgeon: Yolonda Kida, MD;  Location: [facility];  Service: Orthopedics;  Laterality: Left;  2.5 hours   SIGMOID COLECTOMY  09/29/2001   DIVERTICULITIS   TOTAL KNEE ARTHROPLASTY  04/03/2010   RIGHT   TRANSURETHRAL RESECTION OF BLADDER TUMOR  07/06/2010   TRANSURETHRAL RESECTION OF BLADDER TUMOR  05/20/2011   Procedure: TRANSURETHRAL RESECTION OF BLADDER TUMOR (TURBT);  Surgeon: Garnett Farm, MD;  Location: [facility];  Service: Urology;  Laterality: N/A;  GYRUS INSTILL MYTOMYCIN C NO BED   TRANSURETHRAL RESECTION OF BLADDER TUMOR  01/31/2012   Procedure: TRANSURETHRAL RESECTION OF BLADDER TUMOR (TURBT);  Surgeon: Garnett Farm, MD;  Location: [facility];  Service: Urology;  Laterality: N/A;   TRANSURETHRAL RESECTION OF BLADDER TUMOR WITH GYRUS (TURBT-GYRUS) N/A 11/22/2013   Procedure: Bladder biopsy with fulgeration;  Surgeon: Garnett Farm, MD;  Location: [facility];  Service: Urology;  Laterality: N/A;   TRANSURETHRAL RESECTION OF BLADDER TUMOR WITH MITOMYCIN-C N/A 12/23/2019   Procedure: TRANSURETHRAL RESECTION OF BLADDER TUMOR WITH POST-OP  GEMCITABINE;  Surgeon: Marcine Matar, MD;  Location: [facility];  Service: Urology;  Laterality: N/A;  45 MINS   TRANSURETHRAL RESECTION OF BLADDER TUMOR WITH MITOMYCIN-C N/A 03/21/2022    Procedure: TRANSURETHRAL RESECTION OF BLADDER TUMOR WITH GEMCITABINE;  Surgeon: Marcine Matar, MD;  Location: [facility];  Service: Urology;  Laterality: N/A;    Family History  Problem Relation Age of Onset   Heart attack Mother    Lung cancer Nephew    Colon cancer Neg Hx    Rectal cancer Neg Hx    Stomach cancer Neg Hx     Social History   Occupational History   Occupation: retired    Comment: Engineer, civil (consulting) business  Tobacco Use   Smoking status: Every Day    Current packs/day: 0.00    Average packs/day: 1 pack/day for 60.0 years (60.0 ttl pk-yrs)    Types: Cigarettes    Start date: 03/18/1962    Last attempt to quit: 03/18/2022    Years since quitting: 1.2   Smokeless tobacco:  Never   Tobacco comments:    2 packs a day  Vaping Use   Vaping status: Never Used  Substance and Sexual Activity   Alcohol use: No    Alcohol/week: 0.0 standard drinks of alcohol   Drug use: No   Sexual activity: Yes     Physical Exam: Blood pressure 128/64, pulse 83, height 5\' 10"  (1.778 m), weight 273 lb 6.4 oz (124 kg), SpO2 97%.  Gen:      No acute distress, obese Lungs:  diminished, audible upper airway wheezing CV:         Regular rate and rhythm; no murmurs, rubs, or gallops.  No pedal edema Abd: obese, soft  Data Reviewed: Imaging: I have personally reviewed the chest xray Nov 2021 - no acute process  PFTs:     Latest Ref Rng & Units 02/02/2021   10:51 AM  PFT Results  FVC-Pre L 3.54   FVC-Predicted Pre % 82   FVC-Post L 3.68   FVC-Predicted Post % 85   Pre FEV1/FVC % % 69   Post FEV1/FCV % % 71   FEV1-Pre L 2.43   FEV1-Predicted Pre % 77   FEV1-Post L 2.60   DLCO uncorrected ml/min/mmHg 22.86   DLCO UNC% % 90   DLCO corrected ml/min/mmHg 22.73   DLCO COR %Predicted % 89   DLVA Predicted % 90   TLC L 7.28   TLC % Predicted % 105   RV % Predicted % 156    I have personally reviewed the patient's PFTs and mild airflow limitation without BD response.+  air trapping. Normal dlco.   Labs: Lab Results  Component Value Date   WBC 7.4 03/29/2022   HGB 10.6 (L) 03/29/2022   HCT 33.5 (L) 03/29/2022   MCV 73.3 (L) 03/29/2022   PLT 180 03/29/2022    Immunization status: Immunization History  Administered Date(s) Administered   Fluad Quad(high Dose 65+) 01/18/2021   Influenza Split 01/14/2008, 01/26/2009, 01/21/2012, 12/30/2012, 12/24/2016, 01/12/2018, 01/16/2021   Influenza, High Dose Seasonal PF 01/12/2018   Influenza,inj,Quad PF,6+ Mos 01/15/2011   Influenza,inj,quad, With Preservative 12/27/2013, 01/12/2018   Moderna Sars-Covid-2 Vaccination 04/02/2019, 05/03/2019, 02/11/2020   Pneumococcal Conjugate-13 08/04/2014   Pneumococcal Polysaccharide-23 01/12/2007, 01/12/2016   Td 02/11/2004   Tdap 10/23/2009   Zoster, Live 06/04/2012, 03/23/2021     Assessment:  COPD with mild airflow limitation FEV1 77% of predicted, not well controlled OSA on CPAP  download reviewed today shows 100% adherence, AHI <1, no leak Everyday tobacco use disorder 1.5 ppd.   Chronic Obstructive Pulmonary Disease (COPD) COPD mild severity based on PFTs. Worsening dyspnea, increased smoking, non-compliance with CPAP.  - Encourage smoking cessation, discuss varenicline as aid. - Prescribe varenicline, copay $100/year. - Ensure consistent use of Breztri and albuterol. - Order repeat pulmonary function tests. - Conduct ambulatory desaturation study. - Enroll in lung cancer screening program. - Consider home oxygen therapy if saturation <88%. Ambulatory desat study today.  Obstructive Sleep Apnea (OSA) OSA poorly managed due to CPAP non-compliance. Emphasized CPAP use for apnea management. - Encourage CPAP use. - Discuss sleep hygiene, use bed for sleep only.  Gastroesophageal Reflux Disease (GERD) Chronic GERD with persistent symptoms despite omeprazole. Discussed dietary modifications. - Advise dietary modifications: avoid caffeine, alcohol, carbonated  beverages, artificial sweeteners, fried foods, spicy foods, tomato-based foods, chocolate. - Continue omeprazole. - Encourage weight loss.  Smoking Cessation Counseling:  1. The patient is an everyday smoker and symptomatic due to the following condition copd 2.  The patient is currently contemplative in quitting smoking. 3. I advised patient to quit smoking. 4. We identified patient specific barriers to change.  5. I personally spent 3  minutes counseling the patient regarding tobacco use disorder. 6. We discussed management of stress and anxiety to help with smoking cessation, when applicable. 7. We discussed nicotine replacement therapy, Wellbutrin, Chantix as possible options. 8. I advised setting a quit date. 9. Follow?up arranged with our office to continue ongoing discussions. 10.Resources given to patient including quit hotline.    Return to Care: Return in about 3 months (around 09/10/2023) for follow up with PFT and same day appointment.   Durel Salts, MD Pulmonary and Critical Care Medicine [facility] Office:[phone]

## 2023-06-16 ENCOUNTER — Telehealth: Payer: Self-pay | Admitting: Nurse Practitioner

## 2023-06-16 NOTE — Telephone Encounter (Signed)
 Contacted patient to discuss symptoms. Patient says that he has gotten an appointment with Celso Amy, PA-C on 06/23/23 and will discuss with her at that time. He states he has no concerns that he needs advice/help with prior to appointment.

## 2023-06-16 NOTE — Telephone Encounter (Signed)
 Patients wife called stating the patient is having issues with being able to swallow his food. Please advise.

## 2023-06-20 DIAGNOSIS — M1712 Unilateral primary osteoarthritis, left knee: Secondary | ICD-10-CM | POA: Diagnosis not present

## 2023-06-22 NOTE — Progress Notes (Unsigned)
 Michael Canard, Michael Horn [address] [place], [place]  [address] Phone: [phone]   Primary Care Physician: Glena Landau, MD  Primary Gastroenterologist:  Michael Canard, Michael Horn / Michael Johnson, MD   Chief Complaint: Dysphagia; history of colon polyps   HPI:   Michael Horn is a 73 y.o. male, established patient Michael Horn, presents to evaluate dysphagia and history of colon polyps.  He has chronic constipation and chronic anemia.  He has taken stool softeners and Senokot.  History of GERD and takes Nexium 40 Mg daily.  Past medical history of A-fib, hypertension, COPD, OSA, DM2, obesity, sigmoid colon resection for diverticular abscess, adenomatous colon polyps, recurrent bladder cancer s/p transurethral resection of bladder tumors 03/2022.  Takes 81 mg aspirin daily.  No other blood thinners.  11/2020 EGD by Michael Horn: Normal.  Underwent empiric esophageal dilation for dysphagia.  11/2020 Colonoscopy by Michael Horn: Poor prep.  Difficult procedure due to significant looping.  A single medium sized angiodysplastic lesion in the sigmoid colon, internal hemorrhoids.  No polyps.  1 year repeat colonoscopy is overdue.  He needs 2-day prep.  09/2022 labs: BUN 25, creatinine 1.16, GFR 66, glucose 153.  Normal LFTs. 03/2022 labs: Hemoglobin 10.6, hematocrit 33.5, MCV 73.3, platelets 180.  01/2022: CT abdomen pelvis with contrast: No acute findings.  Moderate stool burden consistent with constipation.  Prostamegaly.  Nonobstructing left renal calculi.  10/2022 RUQ Ultrasound, to evaluate RUQ pain: Normal gallbladder, no gallstones, no acute abnormality.  Hepatic steatosis.  Current Outpatient Medications  Medication Sig Dispense Refill   albuterol (VENTOLIN HFA) 108 (90 Base) MCG/ACT inhaler Inhale 2 puffs into the lungs every 4 (four) hours as needed for wheezing or shortness of breath. 1 each 5   aspirin EC 81 MG tablet Take 81 mg by mouth 4 (four) times a week.  Swallow whole.     budeson-glycopyrrolate-formoterol (BREZTRI AEROSPHERE) 160-9-4.8 MCG/ACT AERO Inhale 2 puffs into the lungs in the morning and at bedtime. 1 each 11   citalopram (CELEXA) 20 MG tablet Take 20 mg by mouth daily.     diclofenac Sodium (VOLTAREN ARTHRITIS PAIN) 1 % GEL Apply 2 g topically 2 (two) times daily as needed (pain).     esomeprazole (NEXIUM) 40 MG capsule Take 1 capsule (40 mg total) by mouth daily. Please schedule yearly f/u for further refills 30 capsule 1   ezetimibe (ZETIA) 10 MG tablet Take 10 mg by mouth at bedtime.     insulin aspart protamine- aspart (NOVOLOG MIX 70/30) (70-30) 100 UNIT/ML injection Inject 55 Units into the skin 2 (two) times daily.     ipratropium-albuterol (DUONEB) 0.5-2.5 (3) MG/3ML SOLN Take 3 mLs by nebulization every 4 (four) hours as needed. 360 mL 1   levalbuterol (XOPENEX) 0.63 MG/3ML nebulizer solution Take 3 mLs (0.63 mg total) by nebulization every 6 (six) hours as needed for wheezing or shortness of breath. 75 mL 5   lisinopril (PRINIVIL,ZESTRIL) 10 MG tablet Take 10 mg by mouth every morning.     metFORMIN (GLUCOPHAGE) 1000 MG tablet Take 1,000 mg by mouth 2 (two) times daily.     NOVOLIN 70/30 (70-30) 100 UNIT/ML injection PLEASE SEE ATTACHED FOR DETAILED DIRECTIONS     Oxycodone HCl 10 MG TABS Take 10 mg by mouth every 6 (six) hours as needed (pain).     pravastatin (PRAVACHOL) 40 MG tablet Take 40 mg by mouth daily.     pregabalin (LYRICA) 150 MG capsule Take 150  mg by mouth 2 (two) times daily.     senna-docusate (SENOKOT-S) 8.6-50 MG tablet Take 1 tablet by mouth 2 (two) times daily between meals as needed for moderate constipation.  0   tamsulosin (FLOMAX) 0.4 MG CAPS capsule Take 0.4 mg by mouth every morning.     traZODone (DESYREL) 100 MG tablet Take 100 mg by mouth at bedtime.     trospium (SANCTURA) 20 MG tablet Take 20 mg by mouth 2 (two) times daily.     varenicline (CHANTIX CONTINUING MONTH PAK) 1 MG tablet Take 1  tablet (1 mg total) by mouth 2 (two) times daily. 180 tablet 3   No current facility-administered medications for this visit.    Allergies as of 06/23/2023 - Review Complete 06/11/2023  Allergen Reaction Noted   Aripiprazole  08/08/2021   Contrast media [iodinated contrast media] Rash 05/15/2011   Dulaglutide  08/08/2021   Rosuvastatin  08/08/2021    Past Medical History:  Diagnosis Date   Aortic atherosclerosis (HCC)    Arthritis    Arthritis -DDD."chronic pain med used"   Atrial fibrillation (HCC)    Bladder tumor    states that it was cancer   Cancer (HCC)    Bladder cancer"-MichaelOttelin- checks every 3 months--no signs last checks.   Chronic knee pain RIGHT KNEE --  S/P KNEE REPLACEMENT JAN 2012--  PT STATES  NEEDS ANOTHER REPLACEMENT DUE TO JOINT RECALL   Complication of anesthesia EMERGENCE DELIRIUM   occurred x1- 2 yrs ago.few surgeries ago, none recently   COPD (chronic obstructive pulmonary disease) (HCC)    Coronary artery disease    Depression    Diverticulosis    DM type 2 (diabetes mellitus, type 2) (HCC)    novolog BID and oral agents   GERD (gastroesophageal reflux disease)    H/O hiatal hernia    Headache    History of bladder cancer TCC OF BLADDER  --  FOLLOWED BY Michael Horn   History of peptic ulcer AGE 22   Hypertension    pt denies, takes lisimopril for kidneys   Internal hemorrhoids    Mild obstructive sleep apnea    CPAP   New onset a-fib (HCC) 02/28/2022   with RVR, during hospitalization, will use 30 day heart monitor s/p urology surgery   Nocturia    Peripheral neuropathy BOTTOM RIGHT FOOT   Seasonal allergies    Short of breath on exertion    Urgency of urination     Past Surgical History:  Procedure Laterality Date   BACK SURGERY  X2   lower back   BILATERAL KNEE ARTHROSCOPY     BILATERAL SHOULDER SURG.  2003   CARDIAC CATHETERIZATION  04-13-2007   ---- Michael Jacquelynn Horn   NO SIGNIFICANT CAD/ NORMAL LVF   CARPAL TUNNEL RELEASE  04/11/2006    RIGHT   COLONOSCOPY WITH PROPOFOL N/A 05/01/2015   Procedure: COLONOSCOPY WITH PROPOFOL;  Surgeon: Michael Tucholski Kallman, MD;  Location: [facility];  Service: Endoscopy;  Laterality: N/A;   CYSTOSCOPY W/ RETROGRADES Bilateral 12/23/2019   Procedure: CYSTOSCOPY WITH RETROGRADE PYELOGRAM;  Surgeon: Michael Frizzle, MD;  Location[facility];  Service: Urology;  Laterality: Bilateral;   HAND SURGERY Right    cyst removed   LEFT CARPAL. TUNNEL RELEASE     LEFT EYE SURG.   2008   REMOVAL OF BB   REPAIR RECURRENT LEFT ROTATOR CUFF TEAR  01/02/2011   REVERSE SHOULDER ARTHROPLASTY Left 04/08/2018   Procedure: REVERSE SHOULDER ARTHROPLASTY;  Surgeon: Janeth Medicus, MD;  Location: [facility];  Service: Orthopedics;  Laterality: Left;  2.5 hours   SIGMOID COLECTOMY  09/29/2001   DIVERTICULITIS   TOTAL KNEE ARTHROPLASTY  04/03/2010   RIGHT   TRANSURETHRAL RESECTION OF BLADDER TUMOR  07/06/2010   TRANSURETHRAL RESECTION OF BLADDER TUMOR  05/20/2011   Procedure: TRANSURETHRAL RESECTION OF BLADDER TUMOR (TURBT);  Surgeon: Michael C Ottelin, MD;  Location: [facility];  Service: Urology;  Laterality: N/A;  GYRUS INSTILL MYTOMYCIN C NO BED   TRANSURETHRAL RESECTION OF BLADDER TUMOR  01/31/2012   Procedure: TRANSURETHRAL RESECTION OF BLADDER TUMOR (TURBT);  Surgeon: Michael C Ottelin, MD;  Location: [facility];  Service: Urology;  Laterality: N/A;   TRANSURETHRAL RESECTION OF BLADDER TUMOR WITH GYRUS (TURBT-GYRUS) N/A 11/22/2013   Procedure: Bladder biopsy with fulgeration;  Surgeon: Michael Alliance, MD;  Location: [facility];  Service: Urology;  Laterality: N/A;   TRANSURETHRAL RESECTION OF BLADDER TUMOR WITH MITOMYCIN-C N/A 12/23/2019   Procedure: TRANSURETHRAL RESECTION OF BLADDER TUMOR WITH POST-OP  GEMCITABINE;  Surgeon: Michael Frizzle, MD;  Location: [facility];  Service: Urology;  Laterality: N/A;  45 MINS    TRANSURETHRAL RESECTION OF BLADDER TUMOR WITH MITOMYCIN-C N/A 03/21/2022   Procedure: TRANSURETHRAL RESECTION OF BLADDER TUMOR WITH GEMCITABINE;  Surgeon: Michael Frizzle, MD;  Location: [facility];  Service: Urology;  Laterality: N/A;    Review of Systems:    All systems reviewed and negative except where noted in HPI.    Physical Exam:  There were no vitals taken for this visit. No LMP for male patient.  General: Well-nourished, well-developed in no acute distress.  Lungs: Clear to auscultation bilaterally. Non-labored. Heart: Regular rate and rhythm, no murmurs rubs or gallops.  Abdomen: Bowel sounds are normal; Abdomen is Soft; No hepatosplenomegaly, masses or hernias;  No Abdominal Tenderness; No guarding or rebound tenderness. Neuro: Alert and oriented x 3.  Grossly intact.  Psych: Alert and cooperative, normal mood and affect.   Imaging Studies: No results found.  Assessment and Plan:   Michael Horn is a 73 y.o. y/o male presents for:  1.  Microcytic anemia  Labs: CBC, iron panel, ferritin, B12, folate, and celiac lab.   2.  Dysphagia  Barium swallow with tablet  3.  GERD  4.  Chronic constipation  MiraLAX, Linzess?  5.  Hepatic steatosis  6.  History of colon polyps  Scheduling Colonoscopy I discussed risks of colonoscopy with patient to include risk of bleeding, colon perforation, and risk of sedation.  Patient expressed understanding and agrees to proceed with colonoscopy.  **2 Day Prep.    Michael Canard, Michael Horn  Follow up ***

## 2023-06-23 ENCOUNTER — Ambulatory Visit: Admitting: Physician Assistant

## 2023-06-23 ENCOUNTER — Encounter: Payer: Self-pay | Admitting: Physician Assistant

## 2023-06-23 ENCOUNTER — Other Ambulatory Visit (INDEPENDENT_AMBULATORY_CARE_PROVIDER_SITE_OTHER)

## 2023-06-23 VITALS — BP 126/72 | HR 65 | Ht 70.0 in | Wt 265.8 lb

## 2023-06-23 DIAGNOSIS — R1310 Dysphagia, unspecified: Secondary | ICD-10-CM

## 2023-06-23 DIAGNOSIS — K5909 Other constipation: Secondary | ICD-10-CM

## 2023-06-23 DIAGNOSIS — K5903 Drug induced constipation: Secondary | ICD-10-CM

## 2023-06-23 DIAGNOSIS — K5904 Chronic idiopathic constipation: Secondary | ICD-10-CM

## 2023-06-23 DIAGNOSIS — D649 Anemia, unspecified: Secondary | ICD-10-CM | POA: Diagnosis not present

## 2023-06-23 DIAGNOSIS — T402X5A Adverse effect of other opioids, initial encounter: Secondary | ICD-10-CM | POA: Diagnosis not present

## 2023-06-23 DIAGNOSIS — Z860100 Personal history of colon polyps, unspecified: Secondary | ICD-10-CM

## 2023-06-23 DIAGNOSIS — K219 Gastro-esophageal reflux disease without esophagitis: Secondary | ICD-10-CM | POA: Diagnosis not present

## 2023-06-23 LAB — COMPREHENSIVE METABOLIC PANEL WITH GFR
ALT: 14 U/L (ref 0–53)
AST: 18 U/L (ref 0–37)
Albumin: 4.3 g/dL (ref 3.5–5.2)
Alkaline Phosphatase: 69 U/L (ref 39–117)
BUN: 21 mg/dL (ref 6–23)
CO2: 29 meq/L (ref 19–32)
Calcium: 9.1 mg/dL (ref 8.4–10.5)
Chloride: 102 meq/L (ref 96–112)
Creatinine, Ser: 1.08 mg/dL (ref 0.40–1.50)
GFR: 67.7 mL/min (ref >60.00)
Glucose, Bld: 73 mg/dL (ref 70–99)
Potassium: 4 meq/L (ref 3.5–5.1)
Sodium: 137 meq/L (ref 135–145)
Total Bilirubin: 0.3 mg/dL (ref 0.2–1.2)
Total Protein: 6.6 g/dL (ref 6.0–8.3)

## 2023-06-23 LAB — CBC WITH DIFFERENTIAL/PLATELET
Basophils Absolute: 0.1 10*3/uL (ref 0.0–0.1)
Basophils Relative: 1.1 % (ref 0.0–3.0)
Eosinophils Absolute: 0.1 10*3/uL (ref 0.0–0.7)
Eosinophils Relative: 0.6 % (ref 0.0–5.0)
HCT: 35.3 % — ABNORMAL LOW (ref 39.0–52.0)
Hemoglobin: 10.8 g/dL — ABNORMAL LOW (ref 13.0–17.0)
Lymphocytes Relative: 25.3 % (ref 12.0–46.0)
Lymphs Abs: 3.2 10*3/uL (ref 0.7–4.0)
MCHC: 30.7 g/dL (ref 30.0–36.0)
MCV: 63.5 fl — ABNORMAL LOW (ref 78.0–100.0)
Monocytes Absolute: 0.9 10*3/uL (ref 0.1–1.0)
Monocytes Relative: 7.3 % (ref 3.0–12.0)
Neutro Abs: 8.3 10*3/uL — ABNORMAL HIGH (ref 1.4–7.7)
Neutrophils Relative %: 65.7 % (ref 43.0–77.0)
Platelets: 303 10*3/uL (ref 150.0–400.0)
RBC: 5.56 Mil/uL (ref 4.22–5.81)
RDW: 23 % — ABNORMAL HIGH (ref 11.5–15.5)
WBC: 12.7 10*3/uL — ABNORMAL HIGH (ref 4.0–10.5)

## 2023-06-23 LAB — IBC + FERRITIN
Ferritin: 3.7 ng/mL — ABNORMAL LOW (ref 22.0–322.0)
Iron: 21 ug/dL — ABNORMAL LOW (ref 42–165)
Saturation Ratios: 4 % — ABNORMAL LOW (ref 20.0–50.0)
TIBC: 529.2 ug/dL — ABNORMAL HIGH (ref 250.0–450.0)
Transferrin: 378 mg/dL — ABNORMAL HIGH (ref 212.0–360.0)

## 2023-06-23 LAB — B12 AND FOLATE PANEL
Folate: 12.7 ng/mL (ref >5.9)
Vitamin B-12: 962 pg/mL — ABNORMAL HIGH (ref 211–911)

## 2023-06-23 MED ORDER — NA SULFATE-K SULFATE-MG SULF 17.5-3.13-1.6 GM/177ML PO SOLN: 1.0000 | Freq: Once | ORAL | 0 refills | Status: AC

## 2023-06-23 MED ORDER — ESOMEPRAZOLE MAGNESIUM 40 MG PO CPDR: 40.0000 mg | DELAYED_RELEASE_CAPSULE | Freq: Every day | ORAL | 3 refills | Status: AC

## 2023-06-23 MED ORDER — LINACLOTIDE 145 MCG PO CAPS: 145.0000 ug | ORAL_CAPSULE | Freq: Every day | ORAL | Status: DC

## 2023-06-23 NOTE — Progress Notes (Signed)
 Agree with assessment and plan as outlined.

## 2023-06-23 NOTE — Patient Instructions (Signed)
 Your provider has requested that you go to the basement level for lab work before leaving today. Press "B" on the elevator. The lab is located at the first door on the left as you exit the elevator.   We have sent the following medications to your pharmacy for you to pick up at your convenience: Linzess and Nexium.  We have given you samples of the following medication to take: Linzess 145 mcg and Linzess 290 mcg.   You have been scheduled for a colonoscopy. Please follow written instructions given to you at your visit today.   If you use inhalers (even only as needed), please bring them with you on the day of your procedure.  DO NOT TAKE 7 DAYS PRIOR TO TEST- Trulicity (dulaglutide) Ozempic, Wegovy (semaglutide) Mounjaro (tirzepatide) Bydureon Bcise (exanatide extended release)  DO NOT TAKE 1 DAY PRIOR TO YOUR TEST Rybelsus (semaglutide) Adlyxin (lixisenatide) Victoza (liraglutide) Byetta (exanatide) ___________________________________________________________________________  You have been scheduled for a Barium Esophogram at [facility] Radiology (1st floor of the hospital) on 07/03/23 at 10 am. Please arrive 30 minutes prior to your appointment for registration. Make certain not to have anything to eat or drink 3 hours prior to your test. If you need to reschedule for any reason, please contact radiology at [phone] to do so. __________________________________________________________________ A barium swallow is an examination that concentrates on views of the esophagus. This tends to be a double contrast exam (barium and two liquids which, when combined, create a gas to distend the wall of the oesophagus) or single contrast (non-ionic iodine based). The study is usually tailored to your symptoms so a good history is essential. Attention is paid during the study to the form, structure and configuration of the esophagus, looking for functional disorders (such as aspiration, dysphagia,  achalasia, motility and reflux) EXAMINATION You may be asked to change into a gown, depending on the type of swallow being performed. A radiologist and radiographer will perform the procedure. The radiologist will advise you of the type of contrast selected for your procedure and direct you during the exam. You will be asked to stand, sit or lie in several different positions and to hold a small amount of fluid in your mouth before being asked to swallow while the imaging is performed .In some instances you may be asked to swallow barium coated marshmallows to assess the motility of a solid food bolus. The exam can be recorded as a digital or video fluoroscopy procedure. POST PROCEDURE It will take 1-2 days for the barium to pass through your system. To facilitate this, it is important, unless otherwise directed, to increase your fluids for the next 24-48hrs and to resume your normal diet.  This test typically takes about 30 minutes to perform.  _______________________________________________________  If your blood pressure at your visit was 140/90 or greater, please contact your primary care physician to follow up on this.  _______________________________________________________  If you are age 33 or older, your body mass index should be between 23-30. Your Body mass index is 38.14 kg/m. If this is out of the aforementioned range listed, please consider follow up with your Primary Care Provider.  If you are age 77 or younger, your body mass index should be between 19-25. Your Body mass index is 38.14 kg/m. If this is out of the aformentioned range listed, please consider follow up with your Primary Care Provider.   ________________________________________________________  The [facility] providers would like to encourage you to use [facility] to communicate  with providers for non-urgent requests or questions.  Due to long hold times on the telephone, sending your provider a message by [facility] may  be a faster and more efficient way to get a response.  Please allow 48 business hours for a response.  Please remember that this is for non-urgent requests.  _______________________________________________________  __________________________________________________________________________________

## 2023-06-24 NOTE — Progress Notes (Signed)
 Call and notify patient labs show: 1.  Hemoglobin and Iron are low.  Labs are consistent with iron deficiency anemia. 2.  I recommend go ahead and add on EGD along with colonoscopy already scheduled. 3.  Advise patient to start OTC iron ferrous sulfate 325 mg 1 tablet twice daily with vitamin C.  Hold iron 5 days before EGD and colonoscopy.  Restart iron after procedures.  Schedule lab visit in 6 weeks to check CBC, iron panel, ferritin.  Diagnosis iron deficiency anemia. 4.  Continue with plan for barium swallow test as scheduled.  Continue Nexium that was prescribed. 5.  Did he try Linzess samples?  Does he want prescription? Brigitte Canard, PA-C

## 2023-06-25 ENCOUNTER — Encounter: Payer: Self-pay | Admitting: *Deleted

## 2023-06-25 ENCOUNTER — Other Ambulatory Visit: Payer: Self-pay | Admitting: *Deleted

## 2023-06-25 DIAGNOSIS — D509 Iron deficiency anemia, unspecified: Secondary | ICD-10-CM

## 2023-06-25 DIAGNOSIS — K5904 Chronic idiopathic constipation: Secondary | ICD-10-CM

## 2023-06-25 DIAGNOSIS — K5903 Drug induced constipation: Secondary | ICD-10-CM

## 2023-06-25 MED ORDER — LINACLOTIDE 145 MCG PO CAPS: 145.0000 ug | ORAL_CAPSULE | Freq: Every day | ORAL | 2 refills | Status: DC

## 2023-06-25 NOTE — Addendum Note (Signed)
 Addended by: Glennette Lanius on: 06/25/2023 11:22 AM   Modules accepted: Orders

## 2023-06-27 ENCOUNTER — Emergency Department (HOSPITAL_COMMUNITY)

## 2023-06-27 ENCOUNTER — Emergency Department (HOSPITAL_COMMUNITY)
Admission: EM | Admit: 2023-06-27 | Discharge: 2023-06-27 | Disposition: A | Discharge status: Home / Self Care | Attending: Emergency Medicine | Admitting: Emergency Medicine

## 2023-06-27 ENCOUNTER — Other Ambulatory Visit: Payer: Self-pay

## 2023-06-27 DIAGNOSIS — S71112A Laceration without foreign body, left thigh, initial encounter: Secondary | ICD-10-CM | POA: Diagnosis not present

## 2023-06-27 DIAGNOSIS — Z23 Encounter for immunization: Secondary | ICD-10-CM | POA: Diagnosis not present

## 2023-06-27 DIAGNOSIS — Z794 Long term (current) use of insulin: Secondary | ICD-10-CM | POA: Diagnosis not present

## 2023-06-27 DIAGNOSIS — W228XXA Striking against or struck by other objects, initial encounter: Secondary | ICD-10-CM | POA: Diagnosis not present

## 2023-06-27 DIAGNOSIS — Z7982 Long term (current) use of aspirin: Secondary | ICD-10-CM | POA: Diagnosis not present

## 2023-06-27 DIAGNOSIS — S79922A Unspecified injury of left thigh, initial encounter: Secondary | ICD-10-CM | POA: Diagnosis present

## 2023-06-27 DIAGNOSIS — R600 Localized edema: Secondary | ICD-10-CM | POA: Diagnosis not present

## 2023-06-27 MED ORDER — LIDOCAINE HCL (PF) 1 % IJ SOLN
10.0000 mL | Freq: Once | INTRAMUSCULAR | Status: AC
  Administered 2023-06-27: 10 mL via INTRADERMAL
  Filled 2023-06-27: qty 30

## 2023-06-27 MED ORDER — TETANUS-DIPHTH-ACELL PERTUSSIS 5-2.5-18.5 LF-MCG/0.5 IM SUSY
0.5000 mL | PREFILLED_SYRINGE | Freq: Once | INTRAMUSCULAR | Status: AC
  Administered 2023-06-27: 0.5 mL via INTRAMUSCULAR
  Filled 2023-06-27: qty 0.5

## 2023-06-27 NOTE — ED Provider Notes (Signed)
 [place] EMERGENCY DEPARTMENT AT [facility] Provider Note   CSN: [id number] Arrival date & time: 06/27/23  1549     History Chief Complaint  Patient presents with   Extremity Laceration    Michael Horn is a 73 y.o. male.  Patient presents to the emergency department today with concerns of a laceration to the left inner thigh.  Reports that he was using a grinder that he lost control of which resulted in the grinder hitting against him with a laceration to the left thigh.  Endorsing bruising to the area where the laceration was sustained.  Denies any pain with weightbearing or walking.  Believes his Tdap is out of date.  Not on any blood thinners at this time.  HPI     Home Medications Prior to Admission medications   Medication Sig Start Date End Date Taking? Authorizing Provider  albuterol  (VENTOLIN  HFA) 108 (90 Base) MCG/ACT inhaler Inhale 2 puffs into the lungs every 4 (four) hours as needed for wheezing or shortness of breath. 01/02/21   Meade Verdon RAMAN, MD  aspirin  EC 81 MG tablet Take 81 mg by mouth 4 (four) times a week. Swallow whole.    [provider]  budeson-glycopyrrolate -formoterol  (BREZTRI  AEROSPHERE) 160-9-4.8 MCG/ACT AERO Inhale 2 puffs into the lungs in the morning and at bedtime. 06/11/23   Meade Verdon RAMAN, MD  citalopram  (CELEXA ) 20 MG tablet Take 20 mg by mouth daily. 03/20/23   [provider]  diclofenac Sodium (VOLTAREN ARTHRITIS PAIN) 1 % GEL Apply 2 g topically 2 (two) times daily as needed (pain).    [provider]  esomeprazole  (NEXIUM ) 40 MG capsule Take 1 capsule (40 mg total) by mouth daily. Please schedule yearly f/u for further refills 06/23/23 06/17/24  [place], PA-C  ezetimibe  (ZETIA ) 10 MG tablet Take 10 mg by mouth at bedtime.    [provider]  insulin  aspart protamine- aspart (NOVOLOG  MIX 70/30) (70-30) 100 UNIT/ML injection Inject 55 Units into the skin 2 (two) times daily.    [provider]  ipratropium-albuterol  (DUONEB) 0.5-2.5 (3) MG/3ML SOLN Take 3 mLs by nebulization every 4 (four) hours as needed. 03/29/22   Randol Simmonds, MD  levalbuterol  (XOPENEX ) 0.63 MG/3ML nebulizer solution Take 3 mLs (0.63 mg total) by nebulization every 6 (six) hours as needed for wheezing or shortness of breath. 03/26/22   Cobb, Comer GAILS, NP  linaclotide  (LINZESS ) 145 MCG CAPS capsule Take 1 capsule (145 mcg total) by mouth daily before breakfast. 06/25/23   [place], PA-C  lisinopril  (PRINIVIL ,ZESTRIL ) 10 MG tablet Take 10 mg by mouth every morning.    [provider]  metFORMIN  (GLUCOPHAGE ) 1000 MG tablet Take 1,000 mg by mouth 2 (two) times daily.    [provider]  NOVOLIN 70/30 (70-30) 100 UNIT/ML injection PLEASE SEE ATTACHED FOR DETAILED DIRECTIONS    [provider]  Oxycodone  HCl 10 MG TABS Take 10 mg by mouth every 6 (six) hours as needed (pain). 08/05/21   [provider]  pravastatin  (PRAVACHOL ) 40 MG tablet Take 40 mg by mouth daily.    [provider]  pregabalin  (LYRICA ) 150 MG capsule Take 150 mg by mouth 2 (two) times daily. 10/30/20   [provider]  senna-docusate (SENOKOT-S) 8.6-50 MG tablet Take 1 tablet by mouth 2 (two) times daily between meals as needed for moderate constipation. 03/01/22   Gonfa, Taye T, MD  tamsulosin  (FLOMAX ) 0.4 MG CAPS capsule Take 0.4 mg by  mouth every morning.    [provider]  traZODone  (DESYREL ) 100 MG tablet Take 100 mg by mouth at bedtime.    [provider]  trospium (SANCTURA) 20 MG tablet Take 20 mg by mouth 2 (two) times daily.    [provider]  varenicline  (CHANTIX  CONTINUING MONTH PAK) 1 MG tablet Take 1 tablet (1 mg total) by mouth 2 (two) times daily. Patient not taking: Reported on 06/23/2023 06/11/23   Meade Verdon RAMAN, MD      Allergies    Aripiprazole, Contrast media [iodinated contrast media], Dulaglutide, and Rosuvastatin    Review of  Systems   Review of Systems  Skin:  Positive for wound.  All other systems reviewed and are negative.   Physical Exam Updated Vital Signs BP 133/64   Pulse 88   Temp 98.3 F (36.8 C) (Oral)   Resp 16   Ht 5' 10 (1.778 m)   Wt 120.2 kg   SpO2 100%   BMI 38.02 kg/m  Physical Exam Vitals and nursing note reviewed.  Constitutional:      General: He is not in acute distress.    Appearance: He is well-developed.  HENT:     Head: Normocephalic and atraumatic.  Eyes:     Conjunctiva/sclera: Conjunctivae normal.  Cardiovascular:     Rate and Rhythm: Normal rate and regular rhythm.     Heart sounds: No murmur heard. Pulmonary:     Effort: Pulmonary effort is normal. No respiratory distress.     Breath sounds: Normal breath sounds.  Abdominal:     Palpations: Abdomen is soft.     Tenderness: There is no abdominal tenderness.  Musculoskeletal:        General: No swelling.     Cervical back: Neck supple.  Skin:    General: Skin is warm and dry.     Capillary Refill: Capillary refill takes less than 2 seconds.     Findings: Lesion present.          Comments: 2 cm lesion present in the left inner thigh. Minimal bleeding. Skin tear present intersecting with laceration with a loose epidermal flap.  Neurological:     Mental Status: He is alert.  Psychiatric:        Mood and Affect: Mood normal.     ED Results / Procedures / Treatments   Labs (all labs ordered are listed, but only abnormal results are displayed) Labs Reviewed - No data to display  EKG None  Radiology DG Femur Min 2 Views Left Result Date: 06/27/2023 CLINICAL DATA:  Laceration, foreign body evaluation EXAM: LEFT FEMUR 2 VIEWS COMPARISON:  None Available. FINDINGS: No acute fracture or dislocation. Moderate joint space loss of the lateral compartment at the knee. Subcutaneous edema along the medial thigh. No radiopaque foreign body. IMPRESSION: No acute fracture or dislocation.  No radiopaque foreign body.  Electronically Signed   By: Rogelia Myers M.D.   On: 06/27/2023 19:08    Procedures .Laceration Repair  Date/Time: 06/27/2023 7:14 PM  Performed by: Calab Sachse A, PA-C Authorized by: Isreal Moline A, PA-C   Consent:    Consent obtained:  Verbal   Consent given by:  Patient   Risks, benefits, and alternatives were discussed: yes     Risks discussed:  Infection, pain and poor cosmetic result   Alternatives discussed:  No treatment Universal protocol:    Patient identity confirmed:  Verbally with patient and arm band Anesthesia:    Anesthesia method:  Local infiltration   Local anesthetic:  Lidocaine  1% w/o epi Laceration details:    Location:  Leg   Leg location:  L upper leg   Length (cm):  2   Depth (mm):  3 Pre-procedure details:    Preparation:  Patient was prepped and draped in usual sterile fashion and imaging obtained to evaluate for foreign bodies Exploration:    Hemostasis achieved with:  Direct pressure   Imaging obtained: x-ray     Imaging outcome: foreign body not noted     Contaminated: no   Treatment:    Area cleansed with:  Chlorhexidine    Amount of cleaning:  Standard   Irrigation solution:  Sterile saline   Irrigation volume:  200cc   Irrigation method:  Syringe   Visualized foreign bodies/material removed: no   Skin repair:    Repair method:  Sutures   Suture size:  4-0   Suture material:  Prolene   Suture technique:  Simple interrupted   Number of sutures:  5 Approximation:    Approximation:  Close Repair type:    Repair type:  Simple Post-procedure details:    Dressing:  Bulky dressing   Procedure completion:  Tolerated     Medications Ordered in ED Medications  Tdap (BOOSTRIX) injection 0.5 mL (0.5 mLs Intramuscular Given 06/27/23 1641)  lidocaine  (PF) (XYLOCAINE ) 1 % injection 10 mL (10 mLs Intradermal Given 06/27/23 1803)    ED Course/ Medical Decision Making/ A&P                                 Medical Decision Making Amount  and/or Complexity of Data Reviewed Radiology: ordered.  Risk Prescription drug management.   This patient presents to the ED for concern of laceration.  Differential diagnosis includes retained foreign object, skin abrasion, contaminated wound, cellulitis, hematoma   Imaging Studies ordered:  I ordered imaging studies including x-ray of the left femur I independently visualized and interpreted imaging which showed no obvious signs of retained foreign objects on my personal interpretation, no acute fractures I agree with the radiologist interpretation   Medicines ordered and prescription drug management:  I ordered medication including lidocaine , Tdap for anesthesia, wound infection prevention Reevaluation of the patient after these medicines showed that the patient improved I have reviewed the patients home medicines and have made adjustments as needed   Problem List / ED Course:  Patient presents the emergency department today with concerns of an extremity laceration.  Reports this occurred about 30 minutes prior to arriving.  He states that he was using a grinder that hit into his left thigh resulting in a laceration.  Denies any obvious cut with a sharp blade or any foreign objects contaminating the wound.  Patient states he is out of date on Tdap.  Denies any tingling or numbness. Physical exam reveals a 2 cm laceration in the left upper thigh.  There is also a skin abrasion present with the epidermal layer appear to be eroded out likely from the injury from earlier.  A sizable bruise is also developing on the left upper thigh.  Patient not on blood thinners.  Will obtain x-ray imaging to assess for any possible foreign objects. X-ray imaging is negative for my personal rotation.  Laceration repair performed using 4-0 Prolene.  Advised patient he needs to return for wound evaluation/suture removal in 7 to 10 days.  Advised patient that he can have this done  by his primary care  provider or at an urgent care clinic.  Discussed return precautions such as signs of any infection developing in the lacerated leg.  Patient verbalized understanding treatment plan, return precautions.  Discharged home in stable condition.  Final Clinical Impression(s) / ED Diagnoses Final diagnoses:  Laceration of left thigh, initial encounter    Rx / DC Orders ED Discharge Orders     None         Cecily Legrand LABOR, PA-C 06/27/23 1920    Ruthe Cornet, DO 06/27/23 2251

## 2023-06-27 NOTE — ED Triage Notes (Signed)
 Grinder hit left leg, pt has laceration and puncture wound to left inner thigh and significant bruising. Pt is able to bear weight. Tetanus shot is not up to date. No blood thinners

## 2023-06-27 NOTE — Discharge Instructions (Signed)
 You were seen in the emergency department today for concerns of a laceration to your left thigh.  There is no obvious retained foreign objects seen on x-ray on my exam.  The area was cleaned and washed out before laceration repair was performed placing 5 sutures.  They should remain in place for the next 7 to 10 days.  Please have the wound rechecked at that time to determine if the sutures are able to be removed.  If any signs of infection develop such as redness, pus, or severe and worsening pain, please have the wound evaluated sooner.  Your tetanus shot was updated today.

## 2023-07-03 ENCOUNTER — Ambulatory Visit (HOSPITAL_COMMUNITY)
Admission: RE | Admit: 2023-07-03 | Discharge: 2023-07-03 | Disposition: A | Discharge status: Home / Self Care | Source: Ambulatory Visit | Attending: Physician Assistant | Admitting: Physician Assistant

## 2023-07-03 DIAGNOSIS — K224 Dyskinesia of esophagus: Secondary | ICD-10-CM | POA: Diagnosis not present

## 2023-07-03 DIAGNOSIS — R1310 Dysphagia, unspecified: Secondary | ICD-10-CM | POA: Insufficient documentation

## 2023-07-04 ENCOUNTER — Ambulatory Visit: Admitting: Podiatry

## 2023-07-04 DIAGNOSIS — B351 Tinea unguium: Secondary | ICD-10-CM

## 2023-07-04 DIAGNOSIS — M79674 Pain in right toe(s): Secondary | ICD-10-CM | POA: Diagnosis not present

## 2023-07-04 DIAGNOSIS — M79675 Pain in left toe(s): Secondary | ICD-10-CM | POA: Diagnosis not present

## 2023-07-04 NOTE — Progress Notes (Signed)
 Subjective:  Patient ID: Michael Horn, male    DOB: 1950-04-28,  MRN: [id number]  Michael Horn presents to clinic today for:  Chief Complaint  Patient presents with   [facility]    [facility] with out callous. Last A1c was 5 months ago, it was 6.8 and takes ASA 81   Patient notes nails are thick, discolored, elongated and painful in shoegear when trying to ambulate.  His wife trimmed his nails recently.  He had an injury with a grinder a few dage ago and the blade came loose and cut his thigh.  He has stitches in his thigh and is currently on ABX due to localized infection in the left thigh.    PCP is Glena Landau, MD.  Past Medical History:  Diagnosis Date   Aortic atherosclerosis (HCC)    Arthritis    Arthritis -DDD."chronic pain med used"   Atrial fibrillation [facility])    Bladder tumor    states that it was cancer   [facility])    Bladder cancer"-Dr.Ottelin- checks every 3 months--no signs last checks.   Chronic knee pain RIGHT KNEE --  S/P KNEE REPLACEMENT JAN 2012--  PT STATES  NEEDS ANOTHER REPLACEMENT DUE TO JOINT RECALL   Complication of anesthesia EMERGENCE DELIRIUM   occurred x1- 2 yrs ago.few surgeries ago, none recently   COPD (chronic obstructive pulmonary disease) (HCC)    Coronary artery disease    Depression    Diverticulosis    DM type 2 (diabetes mellitus, type 2) (HCC)    novolog  BID and oral agents   GERD (gastroesophageal reflux disease)    H/O hiatal hernia    Headache    History of bladder cancer [facility] OF BLADDER  --  FOLLOWED BY DR Grady Lawman   History of peptic ulcer AGE 77   Hypertension    pt denies, takes lisimopril for kidneys   Internal hemorrhoids    Mild obstructive sleep apnea    CPAP   New onset a-fib (HCC) 02/28/2022   with RVR, during hospitalization, will use 30 day heart monitor s/p urology surgery   Nocturia    Peripheral neuropathy BOTTOM RIGHT FOOT   Seasonal allergies    Short of breath on exertion    Urgency of urination      Past Surgical History:  Procedure Laterality Date   BACK SURGERY  X2   lower back   BILATERAL KNEE ARTHROSCOPY     BILATERAL SHOULDER SURG.  2003   CARDIAC CATHETERIZATION  04-13-2007   ---- DR Jacquelynn Matter   NO SIGNIFICANT CAD/ NORMAL LVF   CARPAL TUNNEL RELEASE  04/11/2006   RIGHT   COLONOSCOPY WITH PROPOFOL  N/A 05/01/2015   Procedure: COLONOSCOPY WITH PROPOFOL ;  Surgeon: Garrett Kallman, MD;  Location: [facility];  Service: Endoscopy;  Laterality: N/A;   CYSTOSCOPY W/ RETROGRADES Bilateral 12/23/2019   Procedure: CYSTOSCOPY WITH RETROGRADE PYELOGRAM;  Surgeon: Trent Frizzle, MD;  [facility];  Service: Urology;  Laterality: Bilateral;   HAND SURGERY Right    cyst removed   LEFT CARPAL. TUNNEL RELEASE     LEFT EYE SURG.   2008   REMOVAL OF BB   REPAIR RECURRENT LEFT ROTATOR CUFF TEAR  01/02/2011   REVERSE SHOULDER ARTHROPLASTY Left 04/08/2018   Procedure: REVERSE SHOULDER ARTHROPLASTY;  Surgeon: Janeth Medicus, MD;  Location[facility];  Service: Orthopedics;  Laterality: Left;  2.5 hours   SIGMOID COLECTOMY  09/29/2001   DIVERTICULITIS  TOTAL KNEE ARTHROPLASTY  04/03/2010   RIGHT   TRANSURETHRAL RESECTION OF BLADDER TUMOR  07/06/2010   TRANSURETHRAL RESECTION OF BLADDER TUMOR  05/20/2011   Procedure: TRANSURETHRAL RESECTION OF BLADDER TUMOR (TURBT);  Surgeon: Mark C Ottelin, MD;  Location: [facility];  Service: Urology;  Laterality: N/A;  GYRUS INSTILL MYTOMYCIN C NO BED   TRANSURETHRAL RESECTION OF BLADDER TUMOR  01/31/2012   Procedure: TRANSURETHRAL RESECTION OF BLADDER TUMOR (TURBT);  Surgeon: Mark C Ottelin, MD;  Location: [facility];  Service: Urology;  Laterality: N/A;   TRANSURETHRAL RESECTION OF BLADDER TUMOR WITH GYRUS (TURBT-GYRUS) N/A 11/22/2013   Procedure: Bladder biopsy with fulgeration;  Surgeon: Alanson Alliance, MD;  Location: [facility];  Service: Urology;  Laterality: N/A;    TRANSURETHRAL RESECTION OF BLADDER TUMOR WITH MITOMYCIN -C N/A 12/23/2019   Procedure: TRANSURETHRAL RESECTION OF BLADDER TUMOR WITH POST-OP  GEMCITABINE ;  Surgeon: Trent Frizzle, MD;  Location: [facility];  Service: Urology;  Laterality: N/A;  45 MINS   TRANSURETHRAL RESECTION OF BLADDER TUMOR WITH MITOMYCIN -C N/A 03/21/2022   Procedure: TRANSURETHRAL RESECTION OF BLADDER TUMOR WITH GEMCITABINE ;  Surgeon: Trent Frizzle, MD;  Location: [facility]  Service: Urology;  Laterality: N/A;    Allergies  Allergen Reactions   Aripiprazole     Other reaction(s): Unknown   Contrast Media [Iodinated Contrast Media] Rash   Dulaglutide     Other reaction(s): Unknown   Rosuvastatin     Other reaction(s): Unknown    Review of Systems: Negative except as noted in the HPI.  Objective:  Michael Horn is a pleasant 73 y.o. male in NAD. AAO x 3.  Vascular Examination: Capillary refill time is 3-5 seconds to toes bilateral. Palpable pedal pulses b/l LE. Digital hair present b/l.  Skin temperature gradient WNL b/l. No varicosities b/l. No cyanosis noted b/l.   Dermatological Examination: Pedal skin with normal turgor, texture and tone b/l. No open wounds. No interdigital macerations b/l. Toenails x10 are 3mm thick, discolored, dystrophic with subungual debris. There is pain with compression of the nail plates.  They are minimally elongated x10  Assessment/Plan: 1. Pain due to onychomycosis of toenails of both feet     The mycotic toenails were sharply debrided x10 with sterile nail nippers and a power debriding burr to decrease bulk/thickness and length.    F/u 3 months   Michael Horn DEstle Hemp, DPM, FACFAS [facility]     [address] [place], [place] [address]                Office [phone]  Fax [phone]

## 2023-07-07 DIAGNOSIS — T798XXD Other early complications of trauma, subsequent encounter: Secondary | ICD-10-CM | POA: Diagnosis not present

## 2023-07-07 DIAGNOSIS — S71112D Laceration without foreign body, left thigh, subsequent encounter: Secondary | ICD-10-CM | POA: Diagnosis not present

## 2023-07-07 DIAGNOSIS — T798XXA Other early complications of trauma, initial encounter: Secondary | ICD-10-CM | POA: Diagnosis not present

## 2023-07-11 DIAGNOSIS — Z09 Encounter for follow-up examination after completed treatment for conditions other than malignant neoplasm: Secondary | ICD-10-CM | POA: Diagnosis not present

## 2023-07-15 DIAGNOSIS — S43401A Unspecified sprain of right shoulder joint, initial encounter: Secondary | ICD-10-CM | POA: Diagnosis not present

## 2023-07-15 DIAGNOSIS — M25511 Pain in right shoulder: Secondary | ICD-10-CM | POA: Diagnosis not present

## 2023-07-28 DIAGNOSIS — M47817 Spondylosis without myelopathy or radiculopathy, lumbosacral region: Secondary | ICD-10-CM | POA: Diagnosis not present

## 2023-07-28 DIAGNOSIS — T148XXA Other injury of unspecified body region, initial encounter: Secondary | ICD-10-CM | POA: Diagnosis not present

## 2023-07-28 DIAGNOSIS — G894 Chronic pain syndrome: Secondary | ICD-10-CM | POA: Diagnosis not present

## 2023-07-28 DIAGNOSIS — M25561 Pain in right knee: Secondary | ICD-10-CM | POA: Diagnosis not present

## 2023-07-28 DIAGNOSIS — M62830 Muscle spasm of back: Secondary | ICD-10-CM | POA: Diagnosis not present

## 2023-07-29 ENCOUNTER — Encounter: Admitting: Gastroenterology

## 2023-07-31 ENCOUNTER — Encounter: Payer: Self-pay | Admitting: *Deleted

## 2023-07-31 DIAGNOSIS — M12811 Other specific arthropathies, not elsewhere classified, right shoulder: Secondary | ICD-10-CM | POA: Diagnosis not present

## 2023-08-12 DIAGNOSIS — S91302A Unspecified open wound, left foot, initial encounter: Secondary | ICD-10-CM | POA: Diagnosis not present

## 2023-08-12 DIAGNOSIS — S71112A Laceration without foreign body, left thigh, initial encounter: Secondary | ICD-10-CM | POA: Diagnosis not present

## 2023-08-13 ENCOUNTER — Encounter: Payer: Self-pay | Admitting: Gastroenterology

## 2023-08-19 DIAGNOSIS — S91302A Unspecified open wound, left foot, initial encounter: Secondary | ICD-10-CM | POA: Diagnosis not present

## 2023-08-21 ENCOUNTER — Ambulatory Visit: Admitting: Gastroenterology

## 2023-08-21 ENCOUNTER — Encounter: Payer: Self-pay | Admitting: Gastroenterology

## 2023-08-21 VITALS — BP 107/59 | HR 69 | Temp 97.8°F | Resp 13 | Ht 70.0 in | Wt 265.0 lb

## 2023-08-21 DIAGNOSIS — I4891 Unspecified atrial fibrillation: Secondary | ICD-10-CM | POA: Diagnosis not present

## 2023-08-21 DIAGNOSIS — J449 Chronic obstructive pulmonary disease, unspecified: Secondary | ICD-10-CM | POA: Diagnosis not present

## 2023-08-21 DIAGNOSIS — K219 Gastro-esophageal reflux disease without esophagitis: Secondary | ICD-10-CM | POA: Diagnosis not present

## 2023-08-21 DIAGNOSIS — K317 Polyp of stomach and duodenum: Secondary | ICD-10-CM | POA: Diagnosis not present

## 2023-08-21 DIAGNOSIS — D509 Iron deficiency anemia, unspecified: Secondary | ICD-10-CM | POA: Diagnosis not present

## 2023-08-21 DIAGNOSIS — I2510 Atherosclerotic heart disease of native coronary artery without angina pectoris: Secondary | ICD-10-CM | POA: Diagnosis not present

## 2023-08-21 DIAGNOSIS — K648 Other hemorrhoids: Secondary | ICD-10-CM | POA: Diagnosis not present

## 2023-08-21 DIAGNOSIS — D125 Benign neoplasm of sigmoid colon: Secondary | ICD-10-CM

## 2023-08-21 DIAGNOSIS — D124 Benign neoplasm of descending colon: Secondary | ICD-10-CM

## 2023-08-21 DIAGNOSIS — K5520 Angiodysplasia of colon without hemorrhage: Secondary | ICD-10-CM

## 2023-08-21 DIAGNOSIS — D120 Benign neoplasm of cecum: Secondary | ICD-10-CM | POA: Diagnosis not present

## 2023-08-21 DIAGNOSIS — K562 Volvulus: Secondary | ICD-10-CM

## 2023-08-21 DIAGNOSIS — F32A Depression, unspecified: Secondary | ICD-10-CM | POA: Diagnosis not present

## 2023-08-21 DIAGNOSIS — K319 Disease of stomach and duodenum, unspecified: Secondary | ICD-10-CM

## 2023-08-21 DIAGNOSIS — Z860100 Personal history of colon polyps, unspecified: Secondary | ICD-10-CM

## 2023-08-21 DIAGNOSIS — D123 Benign neoplasm of transverse colon: Secondary | ICD-10-CM | POA: Diagnosis not present

## 2023-08-21 MED ORDER — SODIUM CHLORIDE 0.9 % IV SOLN: 500.0000 mL | Freq: Once | INTRAVENOUS | Status: DC

## 2023-08-21 NOTE — Progress Notes (Signed)
 Report given to PACU, vss

## 2023-08-21 NOTE — Patient Instructions (Signed)
 Please read handouts provided. Continue present medications. Await pathology results. Resume previous diet.   YOU HAD AN ENDOSCOPIC PROCEDURE TODAY AT THE [facility]:   Refer to the procedure report that was given to you for any specific questions about what was found during the examination.  If the procedure report does not answer your questions, please call your gastroenterologist to clarify.  If you requested that your care partner not be given the details of your procedure findings, then the procedure report has been included in a sealed envelope for you to review at your convenience later.  YOU SHOULD EXPECT: Some feelings of bloating in the abdomen. Passage of more gas than usual.  Walking can help get rid of the air that was put into your GI tract during the procedure and reduce the bloating. If you had a lower endoscopy (such as a colonoscopy or flexible sigmoidoscopy) you may notice spotting of blood in your stool or on the toilet paper. If you underwent a bowel prep for your procedure, you may not have a normal bowel movement for a few days.  Please Note:  You might notice some irritation and congestion in your nose or some drainage.  This is from the oxygen used during your procedure.  There is no need for concern and it should clear up in a day or so.  SYMPTOMS TO REPORT IMMEDIATELY:  Following lower endoscopy (colonoscopy or flexible sigmoidoscopy):  Excessive amounts of blood in the stool  Significant tenderness or worsening of abdominal pains  Swelling of the abdomen that is new, acute  Fever of 100F or higher  Following upper endoscopy (EGD)  Vomiting of blood or coffee ground material  New chest pain or pain under the shoulder blades  Painful or persistently difficult swallowing  New shortness of breath  Fever of 100F or higher  Black, tarry-looking stools  For urgent or emergent issues, a gastroenterologist can be reached at any hour by calling [phone]  [phone]. Do not use MyChart messaging for urgent concerns.    DIET:  We do recommend a small meal at first, but then you may proceed to your regular diet.  Drink plenty of fluids but you should avoid alcoholic beverages for 24 hours.  ACTIVITY:  You should plan to take it easy for the rest of today and you should NOT DRIVE or use heavy machinery until tomorrow (because of the sedation medicines used during the test).    FOLLOW UP: Our staff will call the number listed on your records the next business day following your procedure.  We will call around 7:15- 8:00 am to check on you and address any questions or concerns that you may have regarding the information given to you following your procedure. If we do not reach you, we will leave a message.     If any biopsies were taken you will be contacted by phone or by letter within the next 1-3 weeks.  Please call us at [phone] if you have not heard about the biopsies in 3 weeks.    SIGNATURES/CONFIDENTIALITY: You and/or your care partner have signed paperwork which will be entered into your electronic medical record.  These signatures attest to the fact that that the information above on your After Visit Summary has been reviewed and is understood.  Full responsibility of the confidentiality of this discharge information lies with you and/or your care-partner.

## 2023-08-21 NOTE — Progress Notes (Signed)
 Called to room to assist during endoscopic procedure.  Patient ID and intended procedure confirmed with present staff. Received instructions for my participation in the procedure from the performing physician.

## 2023-08-21 NOTE — Progress Notes (Signed)
 [facility] History and Physical   Primary Care Physician:  Glena Landau, MD   Reason for Procedure:   IDA, GERD, history of colon polyps  Plan:    EGD with possible dilation, colonoscopy     HPI: Michael Horn is a 73 y.o. male  here for EGD and colonoscopy to evaluate IDA, GERD, history of colon polyps. Last EGD 11/2020, normal, had empiric dilation for dysphagia at the time. Last colonoscopy 11/2020 - poor prep, significant looping, no high risk lesions. Had recommended a repeat exam with double prep within 12 months. He has not had that done. Seen in 2024 and recommended it again, it was not done. Recently noted to have IDA. On nexium  40mg  / day for GERD.   .Otherwise feels well without any cardiopulmonary symptoms.   I have discussed risks / benefits of anesthesia and endoscopic procedure with Roney Coffin and they wish to proceed with the exams as outlined today.    Past Medical History:  Diagnosis Date   Aortic atherosclerosis (HCC)    Arthritis    Arthritis -DDD.chronic pain med used   Atrial fibrillation [facility])    Bladder tumor    states that it was cancer   Cancer [facility])    Bladder cancer-Dr.Ottelin- checks every 3 months--no signs last checks.   Chronic knee pain RIGHT KNEE --  S/P KNEE REPLACEMENT JAN 2012--  PT STATES  NEEDS ANOTHER REPLACEMENT DUE TO JOINT RECALL   Complication of anesthesia EMERGENCE DELIRIUM   occurred x1- 2 yrs ago.few surgeries ago, none recently   COPD (chronic obstructive pulmonary disease) (HCC)    Coronary artery disease    Depression    Diverticulosis    DM type 2 (diabetes mellitus, type 2) (HCC)    novolog  BID and oral agents   GERD (gastroesophageal reflux disease)    H/O hiatal hernia    Headache    History of bladder cancer TCC OF BLADDER  --  FOLLOWED BY DR Grady Lawman   History of peptic ulcer AGE 43   Hypertension    pt denies, takes lisimopril for kidneys   Internal hemorrhoids    Mild obstructive sleep  apnea    CPAP   New onset a-fib (HCC) 02/28/2022   with RVR, during hospitalization, will use 30 day heart monitor s/p urology surgery   Nocturia    Peripheral neuropathy BOTTOM RIGHT FOOT   Seasonal allergies    Short of breath on exertion    Urgency of urination     Past Surgical History:  Procedure Laterality Date   BACK SURGERY  X2   lower back   BILATERAL KNEE ARTHROSCOPY     BILATERAL SHOULDER SURG.  2003   CARDIAC CATHETERIZATION  04-13-2007   ---- DR Jacquelynn Matter   NO SIGNIFICANT CAD/ NORMAL LVF   CARPAL TUNNEL RELEASE  04/11/2006   RIGHT   COLONOSCOPY WITH PROPOFOL  N/A 05/01/2015   Procedure: COLONOSCOPY WITH PROPOFOL ;  Surgeon: Garrett Kallman, MD;  Location: [facility];  Service: Endoscopy;  Laterality: N/A;   CYSTOSCOPY W/ RETROGRADES Bilateral 12/23/2019   Procedure: CYSTOSCOPY WITH RETROGRADE PYELOGRAM;  Surgeon: Trent Frizzle, MD;  Location: [facility];  Service: Urology;  Laterality: Bilateral;   HAND SURGERY Right    cyst removed   LEFT CARPAL. TUNNEL RELEASE     LEFT EYE SURG.   2008   REMOVAL OF BB   REPAIR RECURRENT LEFT ROTATOR CUFF TEAR  01/02/2011   REVERSE SHOULDER ARTHROPLASTY Left 04/08/2018  Procedure: REVERSE SHOULDER ARTHROPLASTY;  Surgeon: Janeth Medicus, MD;  Location: [facility] OR;  Service: Orthopedics;  Laterality: Left;  2.5 hours   SIGMOID COLECTOMY  09/29/2001   DIVERTICULITIS   TOTAL KNEE ARTHROPLASTY  04/03/2010   RIGHT   TRANSURETHRAL RESECTION OF BLADDER TUMOR  07/06/2010   TRANSURETHRAL RESECTION OF BLADDER TUMOR  05/20/2011   Procedure: TRANSURETHRAL RESECTION OF BLADDER TUMOR (TURBT);  Surgeon: Mark C Ottelin, MD;  Location: [facility];  Service: Urology;  Laterality: N/A;  GYRUS INSTILL MYTOMYCIN C NO BED   TRANSURETHRAL RESECTION OF BLADDER TUMOR  01/31/2012   Procedure: TRANSURETHRAL RESECTION OF BLADDER TUMOR (TURBT);  Surgeon: Mark C Ottelin, MD;  Location: [facility];   Service: Urology;  Laterality: N/A;   TRANSURETHRAL RESECTION OF BLADDER TUMOR WITH GYRUS (TURBT-GYRUS) N/A 11/22/2013   Procedure: Bladder biopsy with fulgeration;  Surgeon: Alanson Alliance, MD;  Location: [facility];  Service: Urology;  Laterality: N/A;   TRANSURETHRAL RESECTION OF BLADDER TUMOR WITH MITOMYCIN -C N/A 12/23/2019   Procedure: TRANSURETHRAL RESECTION OF BLADDER TUMOR WITH POST-OP  GEMCITABINE ;  Surgeon: Trent Frizzle, MD;  Location: [facility];  Service: Urology;  Laterality: N/A;  45 MINS   TRANSURETHRAL RESECTION OF BLADDER TUMOR WITH MITOMYCIN -C N/A 03/21/2022   Procedure: TRANSURETHRAL RESECTION OF BLADDER TUMOR WITH GEMCITABINE ;  Surgeon: Trent Frizzle, MD;  Location: [facility];  Service: Urology;  Laterality: N/A;    Prior to Admission medications   Medication Sig Start Date End Date Taking? Authorizing Provider  albuterol  (VENTOLIN  HFA) 108 (90 Base) MCG/ACT inhaler Inhale 2 puffs into the lungs every 4 (four) hours as needed for wheezing or shortness of breath. 01/02/21  Yes Aleck Hurdle, MD  aspirin  EC 81 MG tablet Take 81 mg by mouth 4 (four) times a week. Swallow whole.   Yes [provider]  budeson-glycopyrrolate -formoterol  (BREZTRI  AEROSPHERE) 160-9-4.8 MCG/ACT AERO Inhale 2 puffs into the lungs in the morning and at bedtime. 06/11/23  Yes Aleck Hurdle, MD  citalopram  (CELEXA ) 20 MG tablet Take 20 mg by mouth daily. 03/20/23  Yes [provider]  diclofenac Sodium (VOLTAREN ARTHRITIS PAIN) 1 % GEL Apply 2 g topically 2 (two) times daily as needed (pain).   Yes [provider]  esomeprazole  (NEXIUM ) 40 MG capsule Take 1 capsule (40 mg total) by mouth daily. Please schedule yearly f/u for further refills 06/23/23 06/17/24 Yes Brigitte Canard, PA-C  ezetimibe  (ZETIA ) 10 MG tablet Take 10 mg by mouth at bedtime.   Yes [provider]  insulin  aspart protamine- aspart (NOVOLOG  MIX  70/30) (70-30) 100 UNIT/ML injection Inject 55 Units into the skin 2 (two) times daily.   Yes [provider]  ipratropium-albuterol  (DUONEB) 0.5-2.5 (3) MG/3ML SOLN Take 3 mLs by nebulization every 4 (four) hours as needed. 03/29/22  Yes Trish Furl, MD  linaclotide  (LINZESS ) 145 MCG CAPS capsule Take 1 capsule (145 mcg total) by mouth daily before breakfast. 06/25/23  Yes Brigitte Canard, PA-C  lisinopril  (PRINIVIL ,ZESTRIL ) 10 MG tablet Take 10 mg by mouth every morning.   Yes [provider]  metFORMIN  (GLUCOPHAGE ) 1000 MG tablet Take 1,000 mg by mouth 2 (two) times daily.   Yes [provider]  NOVOLIN 70/30 (70-30) 100 UNIT/ML injection PLEASE SEE ATTACHED FOR DETAILED DIRECTIONS   Yes [provider]  Oxycodone  HCl 10 MG TABS Take 10 mg by mouth every 6 (six) hours as needed (pain). 08/05/21  Yes [provider]  pravastatin  (PRAVACHOL ) 40 MG tablet Take 40 mg by mouth daily.   Yes [provider]  pregabalin  (LYRICA ) 150 MG capsule Take 150 mg by mouth 2 (two) times daily. 10/30/20  Yes [provider]  senna-docusate (SENOKOT-S) 8.6-50 MG tablet Take 1 tablet by mouth 2 (two) times daily between meals as needed for moderate constipation. 03/01/22  Yes Gonfa, Taye T, MD  traZODone  (DESYREL ) 100 MG tablet Take 100 mg by mouth at bedtime.   Yes [provider]  trospium (SANCTURA) 20 MG tablet Take 20 mg by mouth 2 (two) times daily.   Yes [provider]  levalbuterol  (XOPENEX ) 0.63 MG/3ML nebulizer solution Take 3 mLs (0.63 mg total) by nebulization every 6 (six) hours as needed for wheezing or shortness of breath. 03/26/22   Cobb, Mariah Shines, NP  tamsulosin  (FLOMAX ) 0.4 MG CAPS capsule Take 0.4 mg by mouth every morning. Patient not taking: Reported on 08/21/2023    [provider]  varenicline  (CHANTIX  CONTINUING MONTH PAK) 1 MG tablet Take 1 tablet (1 mg total) by mouth 2 (two) times daily. Patient not taking:  Reported on 08/21/2023 06/11/23   Desai, Nikita S, MD    Current Outpatient Medications  Medication Sig Dispense Refill   albuterol  (VENTOLIN  HFA) 108 (90 Base) MCG/ACT inhaler Inhale 2 puffs into the lungs every 4 (four) hours as needed for wheezing or shortness of breath. 1 each 5   aspirin  EC 81 MG tablet Take 81 mg by mouth 4 (four) times a week. Swallow whole.     budeson-glycopyrrolate -formoterol  (BREZTRI  AEROSPHERE) 160-9-4.8 MCG/ACT AERO Inhale 2 puffs into the lungs in the morning and at bedtime. 1 each 11   citalopram  (CELEXA ) 20 MG tablet Take 20 mg by mouth daily.     diclofenac Sodium (VOLTAREN ARTHRITIS PAIN) 1 % GEL Apply 2 g topically 2 (two) times daily as needed (pain).     esomeprazole  (NEXIUM ) 40 MG capsule Take 1 capsule (40 mg total) by mouth daily. Please schedule yearly f/u for further refills 90 capsule 3   ezetimibe  (ZETIA ) 10 MG tablet Take 10 mg by mouth at bedtime.     insulin  aspart protamine- aspart (NOVOLOG  MIX 70/30) (70-30) 100 UNIT/ML injection Inject 55 Units into the skin 2 (two) times daily.     ipratropium-albuterol  (DUONEB) 0.5-2.5 (3) MG/3ML SOLN Take 3 mLs by nebulization every 4 (four) hours as needed. 360 mL 1   linaclotide  (LINZESS ) 145 MCG CAPS capsule Take 1 capsule (145 mcg total) by mouth daily before breakfast. 30 capsule 2   lisinopril  (PRINIVIL ,ZESTRIL ) 10 MG tablet Take 10 mg by mouth every morning.     metFORMIN  (GLUCOPHAGE ) 1000 MG tablet Take 1,000 mg by mouth 2 (two) times daily.     NOVOLIN 70/30 (70-30) 100 UNIT/ML injection PLEASE SEE ATTACHED FOR DETAILED DIRECTIONS     Oxycodone  HCl 10 MG TABS Take 10 mg by mouth every 6 (six) hours as needed (pain).     pravastatin  (PRAVACHOL ) 40 MG tablet Take 40 mg by mouth daily.     pregabalin  (LYRICA ) 150 MG capsule Take 150 mg by mouth 2 (two) times daily.     senna-docusate (SENOKOT-S) 8.6-50 MG tablet Take 1 tablet by mouth 2 (two) times daily between meals as needed for moderate constipation.   0   traZODone  (DESYREL ) 100 MG tablet Take 100 mg by mouth at bedtime.     trospium (SANCTURA) 20 MG tablet Take 20 mg by mouth 2 (two)  times daily.     levalbuterol  (XOPENEX ) 0.63 MG/3ML nebulizer solution Take 3 mLs (0.63 mg total) by nebulization every 6 (six) hours as needed for wheezing or shortness of breath. 75 mL 5   tamsulosin  (FLOMAX ) 0.4 MG CAPS capsule Take 0.4 mg by mouth every morning. (Patient not taking: Reported on 08/21/2023)     varenicline  (CHANTIX  CONTINUING MONTH PAK) 1 MG tablet Take 1 tablet (1 mg total) by mouth 2 (two) times daily. (Patient not taking: Reported on 08/21/2023) 180 tablet 3   Current Facility-Administered Medications  Medication Dose Route Frequency Provider Last Rate Last Admin   0.9 %  sodium chloride  infusion  500 mL Intravenous Once Jaquelyn Sakamoto, Lendon Queen, MD        Allergies as of 08/21/2023 - Review Complete 08/21/2023  Allergen Reaction Noted   Aripiprazole  08/08/2021   Contrast media [iodinated contrast media] Rash 05/15/2011   Dulaglutide  08/08/2021   Rosuvastatin Other (See Comments) 08/08/2021    Family History  Problem Relation Age of Onset   Heart attack Mother    Lung cancer Nephew    Colon cancer Neg Hx    Rectal cancer Neg Hx    Stomach cancer Neg Hx     Social History   Socioeconomic History   Marital status: Married    Spouse name: Alisa   Number of children: 4   Years of education: Not on file   Highest education level: Not on file  Occupational History   Occupation: retired    Comment: Engineer, civil (consulting) business  Tobacco Use   Smoking status: Every Day    Current packs/day: 0.00    Average packs/day: 1 pack/day for 60.0 years (60.0 ttl pk-yrs)    Types: Cigarettes    Start date: 03/18/1962    Last attempt to quit: 03/18/2022    Years since quitting: 1.4   Smokeless tobacco: Never   Tobacco comments:    2 packs a day  Vaping Use   Vaping status: Never Used  Substance and Sexual Activity   Alcohol use: No     Alcohol/week: 0.0 standard drinks of alcohol   Drug use: No   Sexual activity: Yes  Other Topics Concern   Not on file  Social History Narrative   Not on file   Social Drivers of Health   Financial Resource Strain: Not on file  Food Insecurity: No Food Insecurity (02/28/2022)   Hunger Vital Sign    Worried About Running Out of Food in the Last Year: Never true    Ran Out of Food in the Last Year: Never true  Transportation Needs: No Transportation Needs (02/28/2022)   PRAPARE - Administrator, Civil Service (Medical): No    Lack of Transportation (Non-Medical): No  Physical Activity: Not on file  Stress: Not on file  Social Connections: Not on file  Intimate Partner Violence: Not At Risk (02/28/2022)   Humiliation, Afraid, Rape, and Kick questionnaire    Fear of Current or Ex-Partner: No    Emotionally Abused: No    Physically Abused: No    Sexually Abused: No    Review of Systems: All other review of systems negative except as mentioned in the HPI.  Physical Exam: Vital signs BP 112/62   Pulse 77   Temp 97.8 F (36.6 C)   Ht 5' 10 (1.778 m)   Wt 265 lb (120.2 kg)   SpO2 93%   BMI 38.02 kg/m   General:   Alert,  Well-developed, pleasant and cooperative in NAD Lungs:  Clear throughout to auscultation.   Heart:  Regular rate and rhythm Abdomen:  Soft, nontender and nondistended.   Neuro/Psych:  Alert and cooperative. Normal mood and affect. A and O x 3  Christi Coward, MD [facility]

## 2023-08-21 NOTE — Progress Notes (Signed)
1250 Robinul 0.1 mg IV given due large amount of secretions upon assessment.  MD made aware, vss

## 2023-08-21 NOTE — Op Note (Signed)
 [facility] Patient Name: Michael Horn Procedure Date: 08/21/2023 12:52 PM MRN: [id number] Endoscopist: Landon Pinion P. General Kenner , MD, [id number] Age: 73 Referring MD:  Date of Birth: Jul 14, 1950 Gender: Male Account #: [id number] Procedure:                Upper GI endoscopy Indications:              Iron deficiency anemia, history of                            gastro-esophageal reflux disease - on nexium  Medicines:                Monitored Anesthesia Care Procedure:                Pre-Anesthesia Assessment:                           - Prior to the procedure, a History and Physical                            was performed, and patient medications and                            allergies were reviewed. The patient's tolerance of                            previous anesthesia was also reviewed. The risks                            and benefits of the procedure and the sedation                            options and risks were discussed with the patient.                            All questions were answered, and informed consent                            was obtained. Prior Anticoagulants: The patient has                            taken no anticoagulant or antiplatelet agents. ASA                            Grade Assessment: III - A patient with severe                            systemic disease. After reviewing the risks and                            benefits, the patient was deemed in satisfactory                            condition to undergo the procedure.  After obtaining informed consent, the endoscope was                            passed under direct vision. Throughout the                            procedure, the patient's blood pressure, pulse, and                            oxygen saturations were monitored continuously. The                            GIF [id number] #[id number] was introduced through the                            mouth, and  advanced to the second part of duodenum.                            The upper GI endoscopy was accomplished without                            difficulty. The patient tolerated the procedure                            well. Scope In: Scope Out: Findings:                 Esophagogastric landmarks were identified: the                            Z-line was found at 43 cm, the gastroesophageal                            junction was found at 43 cm and the upper extent of                            the gastric folds was found at 43 cm from the                            incisors.                           The exam of the esophagus was otherwise normal.                           The entire examined stomach was normal. Biopsies                            were taken with a cold forceps for Helicobacter                            pylori testing.                           The examined duodenum was normal. Complications:  No immediate complications. Estimated blood loss:                            Minimal. Estimated Blood Loss:     Estimated blood loss was minimal. Impression:               - Esophagogastric landmarks identified.                           - Normal esophagus otherwise.                           - Normal stomach. Biopsied.                           - Normal examined duodenum.                           No overt cause of iron deficiency on EGD. Will                            await biopsy results. Recommendation:           - Patient has a contact number available for                            emergencies. The signs and symptoms of potential                            delayed complications were discussed with the                            patient. Return to normal activities tomorrow.                            Written discharge instructions were provided to the                            patient.                           - Resume previous diet.                            - Continue present medications.                           - Await pathology results. Consideration for                            capsule endoscopy pending his course. Landon Pinion P. Caria Transue, MD 08/21/2023 1:53:12 PM This report has been signed electronically.

## 2023-08-21 NOTE — Op Note (Signed)
 [facility] Patient Name: Corie Vavra Procedure Date: 08/21/2023 12:52 PM MRN: [id number] Endoscopist: Landon Pinion P. General Kenner , MD, [id number] Age: 73 Referring MD:  Date of Birth: 03/05/1951 Gender: Male Account #: [id number] Procedure:                Colonoscopy Indications:              Iron deficiency anemia - last colonoscopy 2022                            limited by poor prep. Chronic constipation - double                            prep used for this exam Medicines:                Monitored Anesthesia Care Procedure:                Pre-Anesthesia Assessment:                           - Prior to the procedure, a History and Physical                            was performed, and patient medications and                            allergies were reviewed. The patient's tolerance of                            previous anesthesia was also reviewed. The risks                            and benefits of the procedure and the sedation                            options and risks were discussed with the patient.                            All questions were answered, and informed consent                            was obtained. Prior Anticoagulants: The patient has                            taken no anticoagulant or antiplatelet agents. ASA                            Grade Assessment: III - A patient with severe                            systemic disease. After reviewing the risks and                            benefits, the patient was deemed in satisfactory  condition to undergo the procedure.                           After obtaining informed consent, the colonoscope                            was passed under direct vision. Throughout the                            procedure, the patient's blood pressure, pulse, and                            oxygen saturations were monitored continuously. The                            Olympus Scope [id number]  was introduced through the                            anus and advanced to the the terminal ileum, with                            identification of the appendiceal orifice and IC                            valve. The colonoscopy was performed without                            difficulty. The patient tolerated the procedure                            well. The quality of the bowel preparation was                            adequate. The terminal ileum, ileocecal valve,                            appendiceal orifice, and rectum were photographed. Scope In: 1:13:23 PM Scope Out: 1:40:45 PM Scope Withdrawal Time: 0 hours 19 minutes 55 seconds  Total Procedure Duration: 0 hours 27 minutes 22 seconds  Findings:                 The perianal and digital rectal examinations were                            normal.                           The terminal ileum appeared normal.                           A 3 mm polyp was found in the cecum. The polyp was                            sessile. The polyp was removed with a cold snare.  Resection and retrieval were complete.                           A single small angiodysplastic lesion was found in                            the ascending colon.                           A 4 mm polyp was found in the transverse colon. The                            polyp was sessile. The polyp was removed with a                            cold snare. Resection and retrieval were complete.                           Two sessile polyps were found in the descending                            colon. The polyps were 3 to 4 mm in size. These                            polyps were removed with a cold snare. Resection                            and retrieval were complete.                           A 3 mm polyp was found in the sigmoid colon. The                            polyp was sessile. The polyp was removed with a                            cold  snare. Resection and retrieval were complete.                           Internal hemorrhoids were found during retroflexion.                           The right colon revealed excessive looping.                            Abdominal pressure applied to achieve cecal                            intubation.                           The exam was otherwise without abnormality. Cecum  and right colon had some residual stool that was                            able to be cleared with lavage. Complications:            No immediate complications. Estimated blood loss:                            Minimal. Estimated Blood Loss:     Estimated blood loss was minimal. Impression:               - The examined portion of the ileum was normal.                           - One 3 mm polyp in the cecum, removed with a cold                            snare. Resected and retrieved.                           - A single colonic angiodysplastic lesion.                           - One 4 mm polyp in the transverse colon, removed                            with a cold snare. Resected and retrieved.                           - Two 3 to 4 mm polyps in the descending colon,                            removed with a cold snare. Resected and retrieved.                           - One 3 mm polyp in the sigmoid colon, removed with                            a cold snare. Resected and retrieved.                           - Internal hemorrhoids.                           - There was significant looping of the colon.                           - The examination was otherwise normal.                           No obvious cause for significant iron deficiency. Recommendation:           - Patient has a contact number available for  emergencies. The signs and symptoms of potential                            delayed complications were discussed with the                             patient. Return to normal activities tomorrow.                            Written discharge instructions were provided to the                            patient.                           - Resume previous diet.                           - Continue present medications.                           - Await pathology results.                           - Consideration for capsule endoscopy, pending EGD                            biopsy results, if H pylori negative. Landon Pinion P. Kollyns Mickelson, MD 08/21/2023 1:51:59 PM This report has been signed electronically.

## 2023-08-22 ENCOUNTER — Telehealth: Payer: Self-pay

## 2023-08-22 NOTE — Telephone Encounter (Signed)
  Follow up Call-     08/21/2023   12:43 PM  Call back number  Post procedure Call Back phone  # [phone]  Permission to leave phone message Yes     Patient questions:  Do you have a fever, pain , or abdominal swelling? No. Pain Score  0 *  Have you tolerated food without any problems? Yes.    Have you been able to return to your normal activities? Yes.    Do you have any questions about your discharge instructions: Diet   No. Medications  No. Follow up visit  No.  Do you have questions or concerns about your Care? No.  Actions: * If pain score is 4 or above: No action needed, pain <4.

## 2023-08-26 ENCOUNTER — Ambulatory Visit: Payer: Self-pay | Admitting: Gastroenterology

## 2023-08-26 DIAGNOSIS — M1712 Unilateral primary osteoarthritis, left knee: Secondary | ICD-10-CM | POA: Diagnosis not present

## 2023-08-26 LAB — SURGICAL PATHOLOGY

## 2023-08-27 ENCOUNTER — Other Ambulatory Visit: Payer: Self-pay

## 2023-08-27 DIAGNOSIS — D509 Iron deficiency anemia, unspecified: Secondary | ICD-10-CM

## 2023-09-04 ENCOUNTER — Telehealth: Payer: Self-pay | Admitting: *Deleted

## 2023-09-04 NOTE — Telephone Encounter (Signed)
   Pre-operative Risk Assessment    Patient Name: Michael Horn  DOB: 29-May-1950 MRN: [id number]   Date of last office visit: 04/30/23 [name], NP Date of next office visit: NONE   Request for Surgical Clearance    Procedure:  LEFT TOTAL KNEE ARTHROPLASTY  Date of Surgery:  Clearance TBD                                Surgeon:  DR. EMMA DARK Surgeon's Group or Practice Name:  [facility] Phone number:  [phone] Fax number:  [phone]   Type of Clearance Requested:   - Medical  - Pharmacy:  Hold Aspirin      Type of Anesthesia:  General    Additional requests/questions:    Bonney Niels Jest   09/04/2023, 1:54 PM

## 2023-09-05 ENCOUNTER — Telehealth: Payer: Self-pay | Admitting: Internal Medicine

## 2023-09-05 ENCOUNTER — Telehealth: Payer: Self-pay

## 2023-09-05 NOTE — Telephone Encounter (Signed)
 Fax received from Dr. Renato with [facility] to perform a left total knee arthroplasty on patient.  Patient needs surgery clearance. Surgery is pending. Patient was seen on 06/11/23. Office protocol is a risk assessment can be sent to surgeon if patient has been seen in 60 days or less.   Pt is scheduled for PFT and ov with Dr. Meade for 09/23/23. I have added to appt note that risk assessment is needed. Will route to the clearance pool for now until visit is complete.

## 2023-09-05 NOTE — Telephone Encounter (Signed)
 Patient has been scheduled med rec and consent done     Patient Consent for Virtual Visit         Michael Horn has provided verbal consent on 09/05/2023 for a virtual visit (video or telephone).   CONSENT FOR VIRTUAL VISIT FOR:  Michael Horn  By participating in this virtual visit I agree to the following:  I hereby voluntarily request, consent and authorize [facility] and its employed or contracted physicians, physician assistants, nurse practitioners or other licensed health care professionals (the Practitioner), to provide me with telemedicine health care services (the "Services) as deemed necessary by the treating Practitioner. I acknowledge and consent to receive the Services by the Practitioner via telemedicine. I understand that the telemedicine visit will involve communicating with the Practitioner through live audiovisual communication technology and the disclosure of certain medical information by electronic transmission. I acknowledge that I have been given the opportunity to request an in-person assessment or other available alternative prior to the telemedicine visit and am voluntarily participating in the telemedicine visit.  I understand that I have the right to withhold or withdraw my consent to the use of telemedicine in the course of my care at any time, without affecting my right to future care or treatment, and that the Practitioner or I may terminate the telemedicine visit at any time. I understand that I have the right to inspect all information obtained and/or recorded in the course of the telemedicine visit and may receive copies of available information for a reasonable fee.  I understand that some of the potential risks of receiving the Services via telemedicine include:  Delay or interruption in medical evaluation due to technological equipment failure or disruption; Information transmitted may not be sufficient (e.g. poor resolution of images) to allow  for appropriate medical decision making by the Practitioner; and/or  In rare instances, security protocols could fail, causing a breach of personal health information.  Furthermore, I acknowledge that it is my responsibility to provide information about my medical history, conditions and care that is complete and accurate to the best of my ability. I acknowledge that Practitioner's advice, recommendations, and/or decision may be based on factors not within their control, such as incomplete or inaccurate data provided by me or distortions of diagnostic images or specimens that may result from electronic transmissions. I understand that the practice of medicine is not an exact science and that Practitioner makes no warranties or guarantees regarding treatment outcomes. I acknowledge that a copy of this consent can be made available to me via my patient portal [facility] MyChart), or I can request a printed copy by calling the office of [facility].    I understand that my insurance will be billed for this visit.   I have read or had this consent read to me. I understand the contents of this consent, which adequately explains the benefits and risks of the Services being provided via telemedicine.  I have been provided ample opportunity to ask questions regarding this consent and the Services and have had my questions answered to my satisfaction. I give my informed consent for the services to be provided through the use of telemedicine in my medical care

## 2023-09-05 NOTE — Telephone Encounter (Signed)
   Name: Michael Horn  DOB: November 18, 1950  MRN: [id number]  Primary Cardiologist: Formerly Dann    Preoperative team, please contact this patient and set up a phone call appointment for further preoperative risk assessment. Please obtain consent and complete medication review. Last seen by Freeman Alberts, NP on 04/28/2023. Thank you for your help.  I confirm that guidance regarding antiplatelet and oral anticoagulation therapy has been completed and, if necessary, noted below. Holding ASA for 7 days.  I also confirmed the patient resides in the state of [place] . As per [facility] Medical Board telemedicine laws, the patient must reside in the state in which the provider is licensed.   Lamarr Satterfield, NP 09/05/2023, 11:39 AM [name]

## 2023-09-05 NOTE — Telephone Encounter (Signed)
 Patient has been scheduled

## 2023-09-11 ENCOUNTER — Ambulatory Visit: Attending: Cardiology | Admitting: Emergency Medicine

## 2023-09-11 DIAGNOSIS — Z01810 Encounter for preprocedural cardiovascular examination: Secondary | ICD-10-CM

## 2023-09-11 NOTE — Progress Notes (Signed)
 Virtual Visit via Telephone Note   Because of Michael Horn co-morbid illnesses, he is at least at moderate risk for complications without adequate follow up.  This format is felt to be most appropriate for this patient at this time.  Due to technical limitations with video connection (technology), today's appointment will be conducted as an audio only telehealth visit, and Michael Horn verbally agreed to proceed in this manner.   All issues noted in this document were discussed and addressed.  No physical exam could be performed with this format.  Evaluation Performed:  Preoperative cardiovascular risk assessment _____________   Date:  09/11/2023   Patient ID:  Michael Horn, DOB 1950/08/13, MRN [id number] Patient Location:  Home Provider location:   Office  Primary Care Provider:  Loreli Kins, MD Primary Cardiologist:  None  Chief Complaint / Patient Profile   73 y.o. y/o male with a h/o paroxysmal atrial fibrillation, hypertension, hyperlipidemia, type 2 diabetes, COPD, tobacco abuse, OSA on CPAP, bladder cancer, morbid obesity who is pending left total knee arthroplasty on date TBD with [facility] and presents today for telephonic preoperative cardiovascular risk assessment.  History of Present Illness    Michael Horn is a 73 y.o. male who presents via audio/video conferencing for a telehealth visit today.  Pt was last seen in cardiology clinic on 04/30/2023 by [name], NP.  At that time Michael Horn was doing well.  The patient is now pending procedure as outlined above. Since his last visit, he denies chest pain, shortness of breath, lower extremity edema, fatigue, palpitations, melena, hematuria, hemoptysis, diaphoresis, weakness, presyncope, syncope, orthopnea, and PND.  Today patient is doing well overall.  He is without acute cardiovascular concerns or complaint at this time.  Denies any anginal or exertional symptoms.  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  He walks his dog often without limitation.  He is able to complete greater than 4 METS.  Past Medical History    Past Medical History:  Diagnosis Date   Aortic atherosclerosis (HCC)    Arthritis    Arthritis -DDD.chronic pain med used   Atrial fibrillation [facility])    Bladder tumor    states that it was cancer   Cancer [facility])    Bladder cancer-Dr.Ottelin- checks every 3 months--no signs last checks.   Chronic knee pain RIGHT KNEE --  S/P KNEE REPLACEMENT JAN 2012--  PT STATES  NEEDS ANOTHER REPLACEMENT DUE TO JOINT RECALL   Complication of anesthesia EMERGENCE DELIRIUM   occurred x1- 2 yrs ago.few surgeries ago, none recently   COPD (chronic obstructive pulmonary disease) (HCC)    Coronary artery disease    Depression    Diverticulosis    DM type 2 (diabetes mellitus, type 2) (HCC)    novolog  BID and oral agents   GERD (gastroesophageal reflux disease)    H/O hiatal hernia    Headache    History of bladder cancer TCC OF BLADDER  --  FOLLOWED BY DR CEIL   History of peptic ulcer AGE 59   Hypertension    pt denies, takes lisimopril for kidneys   Internal hemorrhoids    Mild obstructive sleep apnea    CPAP   New onset a-fib (HCC) 02/28/2022   with RVR, during hospitalization, will use 30 day heart monitor s/p urology surgery   Nocturia    Peripheral neuropathy BOTTOM  RIGHT FOOT   Seasonal allergies    Short of breath on exertion    Urgency of urination    Past Surgical History:  Procedure Laterality Date   BACK SURGERY  X2   lower back   BILATERAL KNEE ARTHROSCOPY     BILATERAL SHOULDER SURG.  2003   CARDIAC CATHETERIZATION  04-13-2007   ---- DR DANN   NO SIGNIFICANT CAD/ NORMAL LVF   CARPAL TUNNEL RELEASE  04/11/2006   RIGHT   COLONOSCOPY WITH PROPOFOL  N/A 05/01/2015   Procedure: COLONOSCOPY WITH PROPOFOL ;  Surgeon: Gladis MARLA Louder, MD;  Location: [facility]  ENDOSCOPY;  Service: Endoscopy;  Laterality: N/A;   CYSTOSCOPY W/ RETROGRADES Bilateral 12/23/2019   Procedure: CYSTOSCOPY WITH RETROGRADE PYELOGRAM;  Surgeon: Matilda Senior, MD;  Location: [facility];  Service: Urology;  Laterality: Bilateral;   HAND SURGERY Right    cyst removed   LEFT CARPAL. TUNNEL RELEASE     LEFT EYE SURG.   2008   REMOVAL OF BB   REPAIR RECURRENT LEFT ROTATOR CUFF TEAR  01/02/2011   REVERSE SHOULDER ARTHROPLASTY Left 04/08/2018   Procedure: REVERSE SHOULDER ARTHROPLASTY;  Surgeon: Sharl Selinda Dover, MD;  Location: [facility] OR;  Service: Orthopedics;  Laterality: Left;  2.5 hours   SIGMOID COLECTOMY  09/29/2001   DIVERTICULITIS   TOTAL KNEE ARTHROPLASTY  04/03/2010   RIGHT   TRANSURETHRAL RESECTION OF BLADDER TUMOR  07/06/2010   TRANSURETHRAL RESECTION OF BLADDER TUMOR  05/20/2011   Procedure: TRANSURETHRAL RESECTION OF BLADDER TUMOR (TURBT);  Surgeon: Mark C Ottelin, MD;  Location[facility];  Service: Urology;  Laterality: N/A;  GYRUS INSTILL MYTOMYCIN C NO BED   TRANSURETHRAL RESECTION OF BLADDER TUMOR  01/31/2012   Procedure: TRANSURETHRAL RESECTION OF BLADDER TUMOR (TURBT);  Surgeon: Mark C Ottelin, MD;  Location[facility];  Service: Urology;  Laterality: N/A;   TRANSURETHRAL RESECTION OF BLADDER TUMOR WITH GYRUS (TURBT-GYRUS) N/A 11/22/2013   Procedure: Bladder biopsy with fulgeration;  Surgeon: Oneil JAYSON Rafter, MD;  Location: [facility];  Service: Urology;  Laterality: N/A;   TRANSURETHRAL RESECTION OF BLADDER TUMOR WITH MITOMYCIN -C N/A 12/23/2019   Procedure: TRANSURETHRAL RESECTION OF BLADDER TUMOR WITH POST-OP  GEMCITABINE ;  Surgeon: Matilda Senior, MD;  Location: [facility];  Service: Urology;  Laterality: N/A;  45 MINS   TRANSURETHRAL RESECTION OF BLADDER TUMOR WITH MITOMYCIN -C N/A 03/21/2022   Procedure: TRANSURETHRAL RESECTION OF BLADDER TUMOR WITH GEMCITABINE ;   Surgeon: Matilda Senior, MD;  Location: [facility];  Service: Urology;  Laterality: N/A;    Allergies  Allergies  Allergen Reactions   Aripiprazole     Other reaction(s): Unknown   Contrast Media [Iodinated Contrast Media] Rash   Dulaglutide     Other reaction(s): Unknown   Rosuvastatin Other (See Comments)    Other reaction(s): joint issues    Home Medications    Prior to Admission medications   Medication Sig Start Date End Date Taking? Authorizing Provider  albuterol  (VENTOLIN  HFA) 108 (90 Base) MCG/ACT inhaler Inhale 2 puffs into the lungs every 4 (four) hours as needed for wheezing or shortness of breath. 01/02/21   Meade Verdon RAMAN, MD  aspirin  EC 81 MG tablet Take 81 mg by mouth 4 (four) times a week. Swallow whole.    [provider]  budeson-glycopyrrolate -formoterol  (BREZTRI  AEROSPHERE) 160-9-4.8 MCG/ACT AERO Inhale 2 puffs into the lungs in the morning and at bedtime. 06/11/23   Desai, Nikita S, MD  citalopram  (CELEXA ) 20 MG tablet Take 20 mg by mouth daily. 03/20/23   [provider]  diclofenac Sodium (VOLTAREN ARTHRITIS PAIN) 1 % GEL Apply 2 g topically 2 (two) times daily as needed (pain).    [provider]  esomeprazole  (NEXIUM ) 40 MG capsule Take 1 capsule (40 mg total) by mouth daily. Please schedule yearly f/u for further refills 06/23/23 06/17/24  [place], PA-C  ezetimibe  (ZETIA ) 10 MG tablet Take 10 mg by mouth at bedtime.    [provider]  insulin  aspart protamine- aspart (NOVOLOG  MIX 70/30) (70-30) 100 UNIT/ML injection Inject 55 Units into the skin 2 (two) times daily.    [provider]  ipratropium-albuterol  (DUONEB) 0.5-2.5 (3) MG/3ML SOLN Take 3 mLs by nebulization every 4 (four) hours as needed. 03/29/22   Randol Simmonds, MD  levalbuterol  (XOPENEX ) 0.63 MG/3ML nebulizer solution Take 3 mLs (0.63 mg total) by nebulization every 6 (six) hours as needed for wheezing or shortness of breath. 03/26/22    Malachy Comer GAILS, NP  linaclotide  (LINZESS ) 145 MCG CAPS capsule Take 1 capsule (145 mcg total) by mouth daily before breakfast. 06/25/23   [place], PA-C  lisinopril  (PRINIVIL ,ZESTRIL ) 10 MG tablet Take 10 mg by mouth every morning.    [provider]  metFORMIN  (GLUCOPHAGE ) 1000 MG tablet Take 1,000 mg by mouth 2 (two) times daily.    [provider]  NOVOLIN 70/30 (70-30) 100 UNIT/ML injection PLEASE SEE ATTACHED FOR DETAILED DIRECTIONS    [provider]  Oxycodone  HCl 10 MG TABS Take 10 mg by mouth every 6 (six) hours as needed (pain). 08/05/21   [provider]  pravastatin  (PRAVACHOL ) 40 MG tablet Take 40 mg by mouth daily.    [provider]  pregabalin  (LYRICA ) 150 MG capsule Take 150 mg by mouth 2 (two) times daily. 10/30/20   [provider]  senna-docusate (SENOKOT-S) 8.6-50 MG tablet Take 1 tablet by mouth 2 (two) times daily between meals as needed for moderate constipation. 03/01/22   Gonfa, Taye T, MD  tamsulosin  (FLOMAX ) 0.4 MG CAPS capsule Take 0.4 mg by mouth every morning. Patient not taking: Reported on 08/21/2023    [provider]  traZODone  (DESYREL ) 100 MG tablet Take 100 mg by mouth at bedtime.    [provider]  trospium (SANCTURA) 20 MG tablet Take 20 mg by mouth 2 (two) times daily.    [provider]  varenicline  (CHANTIX  CONTINUING MONTH PAK) 1 MG tablet Take 1 tablet (1 mg total) by mouth 2 (two) times daily. Patient not taking: Reported on 08/21/2023 06/11/23   Meade Verdon RAMAN, MD    Physical Exam    Vital Signs:  DETRON CARRAS does not have vital signs available for review today.  Given telephonic nature of communication, physical exam is limited. AAOx3. NAD. Normal affect.  Speech and respirations are unlabored.  Accessory Clinical Findings    None  Assessment & Plan    1.  Preoperative Cardiovascular Risk Assessment: According to the Revised Cardiac Risk Index (RCRI),  his Perioperative Risk of Major Cardiac Event is (%): 0.9. His Functional Capacity in METs is: 5.62 according to the Duke Activity Status Index (DASI).  Therefore, based on ACC/AHA guidelines, patient would be at acceptable risk for the planned procedure without further cardiovascular testing. I will route this recommendation to the requesting party via Epic fax function.  The patient was advised that if he develops new symptoms prior to surgery to contact our  office to arrange for a follow-up visit, and he verbalized understanding.  He may hold aspirin  for 5-7 days prior to procedure. Please resume aspirin  as soon as possible postprocedure, at the discretion of the surgeon.    A copy of this note will be routed to requesting surgeon.  Time:   Today, I have spent 7 minutes with the patient with telehealth technology discussing medical history, symptoms, and management plan.     Lum LITTIE Louis, NP  09/11/2023, 10:29 AM

## 2023-09-16 ENCOUNTER — Ambulatory Visit: Admitting: Gastroenterology

## 2023-09-16 DIAGNOSIS — D509 Iron deficiency anemia, unspecified: Secondary | ICD-10-CM

## 2023-09-16 NOTE — Patient Instructions (Signed)
 SN: [id number] Exp: 05/08/26 LOT: [id number]  Patient arrived for VCE. Reported the prep went well. This RN explained capsule dietary restrictions for the next few hours. Pt advised to return at 4 pm to return capsule equipment.  Patient verbalized understanding. Opened capsule, ensured capsule was flashing prior to the patient swallowing the capsule. Patient swallowed capsule without difficulty.  Patient told to call the office with any questions and if capsule has not passed after 72 hours. No further questions by the conclusion of the visit.

## 2023-09-16 NOTE — Progress Notes (Signed)
 You may have clear liquids beginning at 10:30 am after ingesting the capsule.    You can have a light lunch at 12:30 pm; sandwich and half bowl of soup.  Return to the office at 4 pm to return the equipment.   Return to you normal diet at 5 pm.   Call [phone] and ask for Hilma Favors BSN RN if you have any questions.  You should pass the capsule in your stool 8-48 hours after ingestion. If you have not passed the capsule, after 72 hours, please contact the office at [phone].

## 2023-09-22 ENCOUNTER — Other Ambulatory Visit: Payer: Self-pay | Admitting: Physician Assistant

## 2023-09-22 DIAGNOSIS — G894 Chronic pain syndrome: Secondary | ICD-10-CM | POA: Diagnosis not present

## 2023-09-22 DIAGNOSIS — K5903 Drug induced constipation: Secondary | ICD-10-CM

## 2023-09-22 DIAGNOSIS — M25561 Pain in right knee: Secondary | ICD-10-CM | POA: Diagnosis not present

## 2023-09-22 DIAGNOSIS — M47817 Spondylosis without myelopathy or radiculopathy, lumbosacral region: Secondary | ICD-10-CM | POA: Diagnosis not present

## 2023-09-22 DIAGNOSIS — K5904 Chronic idiopathic constipation: Secondary | ICD-10-CM

## 2023-09-22 DIAGNOSIS — M62830 Muscle spasm of back: Secondary | ICD-10-CM | POA: Diagnosis not present

## 2023-09-23 ENCOUNTER — Encounter

## 2023-09-23 ENCOUNTER — Encounter: Payer: Self-pay | Admitting: Internal Medicine

## 2023-09-23 ENCOUNTER — Ambulatory Visit: Admitting: Internal Medicine

## 2023-09-23 VITALS — BP 136/86 | HR 70 | Ht 70.5 in | Wt 265.0 lb

## 2023-09-23 DIAGNOSIS — F17210 Nicotine dependence, cigarettes, uncomplicated: Secondary | ICD-10-CM | POA: Diagnosis not present

## 2023-09-23 DIAGNOSIS — J439 Emphysema, unspecified: Secondary | ICD-10-CM

## 2023-09-23 DIAGNOSIS — J4489 Other specified chronic obstructive pulmonary disease: Secondary | ICD-10-CM

## 2023-09-23 DIAGNOSIS — J449 Chronic obstructive pulmonary disease, unspecified: Secondary | ICD-10-CM | POA: Diagnosis not present

## 2023-09-23 DIAGNOSIS — G4733 Obstructive sleep apnea (adult) (pediatric): Secondary | ICD-10-CM | POA: Diagnosis not present

## 2023-09-23 DIAGNOSIS — F17200 Nicotine dependence, unspecified, uncomplicated: Secondary | ICD-10-CM

## 2023-09-23 LAB — PULMONARY FUNCTION TEST
DL/VA % pred: 81 %
DL/VA: 3.27 ml/min/mmHg/L
DLCO unc % pred: 78 %
DLCO unc: 19.63 ml/min/mmHg
FEF 25-75 Pre: 0.92 L/s
FEF2575-%Pred-Pre: 41 %
FEV1-%Pred-Pre: 61 %
FEV1-Pre: 1.88 L
FEV1FVC-%Pred-Pre: 86 %
FEV6-%Pred-Pre: 75 %
FEV6-Pre: 2.97 L
FEV6FVC-%Pred-Pre: 106 %
FVC-%Pred-Pre: 70 %
FVC-Pre: 2.97 L
Pre FEV1/FVC ratio: 63 %
Pre FEV6/FVC Ratio: 100 %

## 2023-09-23 MED ORDER — BREZTRI AEROSPHERE 160-9-4.8 MCG/ACT IN AERO: 2.0000 | INHALATION_SPRAY | Freq: Two times a day (BID) | RESPIRATORY_TRACT | Status: AC

## 2023-09-23 MED ORDER — BUPROPION HCL ER (SR) 150 MG PO TB12: 150.0000 mg | ORAL_TABLET | Freq: Two times a day (BID) | ORAL | 11 refills | Status: AC

## 2023-09-23 NOTE — Progress Notes (Signed)
Spiro/DLCO performed today. 

## 2023-09-23 NOTE — Patient Instructions (Signed)
Spiro/DLCO performed today. 

## 2023-09-23 NOTE — Addendum Note (Signed)
 Addended by: CLAIR SEVERA MATSU on: 09/23/2023 04:43 PM   Modules accepted: Orders

## 2023-09-23 NOTE — Patient Instructions (Addendum)
 It was a pleasure to see you today!  Please schedule follow up with myself in 6 months.  If my schedule is not open yet, we will contact you with a reminder closer to that time. Please call [phone] if you haven't heard from us  a month before, and always call us  sooner if issues or concerns arise. You can also send us  a message through MyChart, but but aware that this is not to be used for urgent issues and it may take up to 5-7 days to receive a reply. Please be aware that you will likely be able to view your results before I have a chance to respond to them. Please give us  5 business days to respond to any non-urgent results.   Continue the Breztri  2 puffs twice a day, gargle after use. Continue albuterol  inhaler as needed. I do recommend the flu and RSV shots this fall.  I will have the lung cancer screening team call out to you and schedule the scan with your wife I recommend starting Wellbutrin  to help with smoking cessation.  Bupropion  -- Bupropion  (brand names: Zyban , Wellbutrin ) is an antidepressant that can be used to help you stop smoking.  Start taking it once per day for three days, then increase to twice daily starting four weeks before the quit date.  You should typically continue for 7 to 12 weeks after you quit smoking.  Please do not stop taking this medication abruptly.  When you are ready to stop we will have you go to once daily for two weeks and then you can do once every other day for a week before you stop.   Bupropion  is generally well-tolerated, but it may cause dry mouth and difficulty sleeping. The drug should not be used by people who have a seizure disorder or bipolar (manic-depressive) disorder, and it is not recommended for those who have head trauma, anorexia nervosa, or bulimia, or for those who drink alcohol excessively.  I will order an overnight oximetry on room air to help qualify you for oxygen at nighttime since you cannot wear CPAP.

## 2023-09-23 NOTE — Progress Notes (Signed)
 Michael Horn    [id number]    [birth year]  Primary Care Physician:Shaw, Joen, MD Date of Appointment: 09/23/2023 Established Patient Visit  Chief complaint:   Chief Complaint  Patient presents with   Follow-up     HPI: Michael Horn is a 73 y.o. man with tobacco use disorder and COPD. Also has OSA on CPAP.  Interval Updates: Here for follow up after PFTS  showing a decline in FEV1 from 77 to 61%. Chantix  wasn't affordable.   Hasn't worn cpap in over 6 months. Wife asking about ONO on room air.   Continuing on breztri  2 puffs twice daily with prn albuterol   Ongoing reflux.   Was referred to lung cancer screening but never answered his phone. Wife asking if we can call her instead.   I have reviewed the patient's family social and past medical history and updated as appropriate.   Past Medical History:  Diagnosis Date   Aortic atherosclerosis (HCC)    Arthritis    Arthritis -DDD.chronic pain med used   Atrial fibrillation [facility])    Bladder tumor    states that it was cancer   Cancer [facility])    Bladder cancer-Michael.Ottelin- checks every 3 months--no signs last checks.   Chronic knee pain RIGHT KNEE --  S/P KNEE REPLACEMENT JAN 2012--  PT STATES  NEEDS ANOTHER REPLACEMENT DUE TO JOINT RECALL   Complication of anesthesia EMERGENCE DELIRIUM   occurred x1- 2 yrs ago.few surgeries ago, none recently   COPD (chronic obstructive pulmonary disease) (HCC)    Coronary artery disease    Depression    Diverticulosis    DM type 2 (diabetes mellitus, type 2) (HCC)    novolog  BID and oral agents   GERD (gastroesophageal reflux disease)    H/O hiatal hernia    Headache    History of bladder cancer TCC OF BLADDER  --  FOLLOWED BY Michael Michael Horn   History of peptic ulcer AGE 27   Hypertension    pt denies, takes lisimopril for kidneys   Internal hemorrhoids    Mild obstructive sleep apnea    CPAP   New onset a-fib (HCC) 02/28/2022   with RVR, during  hospitalization, will use 30 day heart monitor s/p urology surgery   Nocturia    Peripheral neuropathy BOTTOM RIGHT FOOT   Seasonal allergies    Short of breath on exertion    Urgency of urination     Past Surgical History:  Procedure Laterality Date   BACK SURGERY  X2   lower back   BILATERAL KNEE ARTHROSCOPY     BILATERAL SHOULDER SURG.  2003   CARDIAC CATHETERIZATION  04-13-2007   ---- Michael Horn   NO SIGNIFICANT CAD/ NORMAL LVF   CARPAL TUNNEL RELEASE  04/11/2006   RIGHT   COLONOSCOPY WITH PROPOFOL  N/A 05/01/2015   Procedure: COLONOSCOPY WITH PROPOFOL ;  Surgeon: Michael MARLA Louder, MD;  Location: [facility];  Service: Endoscopy;  Laterality: N/A;   CYSTOSCOPY W/ RETROGRADES Bilateral 12/23/2019   Procedure: CYSTOSCOPY WITH RETROGRADE PYELOGRAM;  Surgeon: Michael Senior, MD;  Location: [facility];  Service: Urology;  Laterality: Bilateral;   HAND SURGERY Right    cyst removed   LEFT CARPAL. TUNNEL RELEASE     LEFT EYE SURG.   2008   REMOVAL OF BB   REPAIR RECURRENT LEFT ROTATOR CUFF TEAR  01/02/2011   REVERSE SHOULDER ARTHROPLASTY Left 04/08/2018   Procedure: REVERSE SHOULDER ARTHROPLASTY;  Surgeon: Michael Selinda Dover, MD;  Location: [facility] OR;  Service: Orthopedics;  Laterality: Left;  2.5 hours   SIGMOID COLECTOMY  09/29/2001   DIVERTICULITIS   TOTAL KNEE ARTHROPLASTY  04/03/2010   RIGHT   TRANSURETHRAL RESECTION OF BLADDER TUMOR  07/06/2010   TRANSURETHRAL RESECTION OF BLADDER TUMOR  05/20/2011   Procedure: TRANSURETHRAL RESECTION OF BLADDER TUMOR (TURBT);  Surgeon: Michael C Ottelin, MD;  Location: [facility];  Service: Urology;  Laterality: N/A;  GYRUS INSTILL MYTOMYCIN C NO BED   TRANSURETHRAL RESECTION OF BLADDER TUMOR  01/31/2012   Procedure: TRANSURETHRAL RESECTION OF BLADDER TUMOR (TURBT);  Surgeon: Michael C Ottelin, MD;  Location: [facility];  Service: Urology;  Laterality: N/A;   TRANSURETHRAL RESECTION OF BLADDER  TUMOR WITH GYRUS (TURBT-GYRUS) N/A 11/22/2013   Procedure: Bladder biopsy with fulgeration;  Surgeon: Michael JAYSON Rafter, MD;  Location: [facility];  Service: Urology;  Laterality: N/A;   TRANSURETHRAL RESECTION OF BLADDER TUMOR WITH MITOMYCIN -C N/A 12/23/2019   Procedure: TRANSURETHRAL RESECTION OF BLADDER TUMOR WITH POST-OP  GEMCITABINE ;  Surgeon: Michael Senior, MD;  Location: [facility];  Service: Urology;  Laterality: N/A;  45 MINS   TRANSURETHRAL RESECTION OF BLADDER TUMOR WITH MITOMYCIN -C N/A 03/21/2022   Procedure: TRANSURETHRAL RESECTION OF BLADDER TUMOR WITH GEMCITABINE ;  Surgeon: Michael Senior, MD;  Location: [facility];  Service: Urology;  Laterality: N/A;    Family History  Problem Relation Age of Onset   Heart attack Mother    Lung cancer Nephew    Colon cancer Neg Hx    Rectal cancer Neg Hx    Stomach cancer Neg Hx     Social History   Occupational History   Occupation: retired    Comment: Engineer, civil (consulting) business  Tobacco Use   Smoking status: Every Day    Current packs/day: 0.00    Average packs/day: 1 pack/day for 60.0 years (60.0 ttl pk-yrs)    Types: Cigarettes    Start date: 03/18/1962    Last attempt to quit: 03/18/2022    Years since quitting: 1.5   Smokeless tobacco: Never   Tobacco comments:    2 packs a day  Vaping Use   Vaping status: Never Used  Substance and Sexual Activity   Alcohol use: No    Alcohol/week: 0.0 standard drinks of alcohol   Drug use: No   Sexual activity: Yes     Physical Exam: Blood pressure 136/86, pulse 70, height 5' 10.5 (1.791 m), weight 265 lb (120.2 kg), SpO2 96%.  Gen:      No acute distress, obese Lungs:  diminished, audible upper airway wheezing CV:         Regular rate and rhythm; no murmurs, rubs, or gallops.  No pedal edema Abd: obese, soft  Data Reviewed: Imaging: I have personally reviewed the chest xray Nov 2021 - no acute process  PFTs:     Latest Ref Rng &  Units 09/23/2023    1:41 PM 02/02/2021   10:51 AM  PFT Results  FVC-Pre L 2.97  P 3.54   FVC-Predicted Pre % 70  P 82   FVC-Post L  3.68   FVC-Predicted Post %  85   Pre FEV1/FVC % % 63  P 69   Post FEV1/FCV % %  71   FEV1-Pre L 1.88  P 2.43   FEV1-Predicted Pre % 61  P 77   FEV1-Post L  2.60   DLCO uncorrected ml/min/mmHg 19.63  P 22.86   DLCO UNC% % 78  P 90   DLCO corrected ml/min/mmHg  22.73   DLCO COR %Predicted %  89   DLVA Predicted % 81  P 90   TLC L  7.28   TLC % Predicted %  105   RV % Predicted %  156     P Preliminary result   I have personally reviewed the patient's PFTs and mild airflow limitation without BD response.+ air trapping. Normal dlco.   Labs: Lab Results  Component Value Date   WBC 12.7 (H) 06/23/2023   HGB 10.8 (L) 06/23/2023   HCT 35.3 (L) 06/23/2023   MCV 63.5 Repeated and verified X2. (L) 06/23/2023   PLT 303.0 06/23/2023    Immunization status: Immunization History  Administered Date(s) Administered   Fluad Quad(high Dose 65+) 01/18/2021   Fluzone Influenza virus vaccine,trivalent (IIV3), split virus 01/14/2008, 01/26/2009, 01/21/2012, 12/30/2012, 12/24/2016, 01/12/2018, 01/16/2021   Influenza, High Dose Seasonal PF 01/12/2018   Influenza,inj,Quad PF,6+ Mos 01/15/2011   Influenza,inj,quad, With Preservative 12/27/2013, 01/12/2018   Moderna Sars-Covid-2 Vaccination 04/02/2019, 05/03/2019, 02/11/2020   Pneumococcal Conjugate-13 08/04/2014   Pneumococcal Polysaccharide-23 01/12/2007, 01/12/2016   Td 02/11/2004   Tdap 10/23/2009, 06/27/2023   Zoster, Live 06/04/2012, 03/23/2021     Assessment:   Everyday tobacco use disorder 1.5 ppd, desire to quit - start wellbutrin   Chronic Obstructive Pulmonary Disease (COPD) With progressin of disease - continue breztri  two puffs twice daily, gargle after use - Enroll in lung cancer screening program - will re-refer and ask the team to call his wife to schedule instead.   Obstructive Sleep  Apnea (OSA) Not on cpap - ono on room air to qualify for nocturnal oxygen  Gastroesophageal Reflux Disease (GERD) Chronic GERD with persistent symptoms despite omeprazole. Discussed dietary modifications. - Advise dietary modifications: avoid caffeine, alcohol, carbonated beverages, artificial sweeteners, fried foods, spicy foods, tomato-based foods, chocolate. - Continue omeprazole. - Encourage weight loss.  Smoking Cessation Counseling:  1. The patient is an everyday smoker and symptomatic due to the following condition copd 2. The patient is currently contemplative in quitting smoking. 3. I advised patient to quit smoking. 4. We identified patient specific barriers to change.  5. I personally spent 3 minutes counseling the patient regarding tobacco use disorder. 6. We discussed management of stress and anxiety to help with smoking cessation, when applicable. 7. We discussed nicotine  replacement therapy, Wellbutrin , Chantix  as possible options. 8. I advised setting a quit date. 9. Follow?up arranged with our office to continue ongoing discussions. 10.Resources given to patient including quit hotline.    Return to Care: Return in about 6 months (around 03/25/2024).   Verdon Gore, MD Pulmonary and Critical Care Medicine [facility] [phone]

## 2023-09-24 ENCOUNTER — Ambulatory Visit: Payer: Self-pay | Admitting: Internal Medicine

## 2023-09-24 ENCOUNTER — Telehealth: Payer: Self-pay | Admitting: Gastroenterology

## 2023-09-24 DIAGNOSIS — D509 Iron deficiency anemia, unspecified: Secondary | ICD-10-CM

## 2023-09-24 NOTE — Telephone Encounter (Signed)
 Capsule endoscopy result: 09/16/23 Adequate prep, negative study without abnormalities to account for his iron deficiency.   He did have an AVM in his colon on colonoscopy but did not appear high risk for bleeding etc.  We will await his course on iron and see if his labs improved at this point.    Jan - can you help relay the following : - Capsule endoscopy was a good study and negative, no cause for IDA.  - He should have been on iron for a few months at this point.  I would like him to come back for CBC with TIBC/ferritin panel to recheck this to see if he is responding appropriately to iron if you can order and have him go to the lab.  Thank you

## 2023-09-25 NOTE — Addendum Note (Signed)
 Addended by: Maksim Peregoy M on: 09/25/2023 04:09 PM   Modules accepted: Orders

## 2023-09-25 NOTE — Telephone Encounter (Signed)
 Called pt.  Relayed results and recommendations.  Patient expressed understanding. He confirmed he has been taking oral iron.  He indicates he will come to the lab in the next or two.  Orders are in

## 2023-09-29 ENCOUNTER — Telehealth: Payer: Self-pay | Admitting: *Deleted

## 2023-09-29 ENCOUNTER — Other Ambulatory Visit: Payer: Self-pay | Admitting: *Deleted

## 2023-09-29 DIAGNOSIS — Z122 Encounter for screening for malignant neoplasm of respiratory organs: Secondary | ICD-10-CM

## 2023-09-29 DIAGNOSIS — F17210 Nicotine dependence, cigarettes, uncomplicated: Secondary | ICD-10-CM

## 2023-09-29 DIAGNOSIS — Z87891 Personal history of nicotine dependence: Secondary | ICD-10-CM

## 2023-09-29 NOTE — Telephone Encounter (Signed)
 Lung Cancer Screening Narrative/Criteria Questionnaire (Cigarette Smokers Only- No Cigars/Pipes/vapes)   Michael Horn   SDMV:10/03/23 11:15- [place]                                           September 03, 1950              LDCT: 10/06/23 1:00- [name]    73 y.o.   Phone: [phone]  Lung Screening Narrative (confirm age 64-77 yrs Medicare / 50-80 yrs Private pay insurance)   Insurance information:HTA   Referring Provider:Desai   This screening involves an initial phone call with a team member from our program. It is called a shared decision making visit. The initial meeting is required by insurance and Medicare to make sure you understand the program. This appointment takes about 15-20 minutes to complete. The CT scan will completed at a separate date/time. This scan takes about 5-10 minutes to complete and you may eat and drink before and after the scan.  Criteria questions for Lung Cancer Screening:   Are you a current or former smoker? Current Age began smoking: 36   If you are a former smoker, what year did you quit smoking? (within 15 yrs)   To calculate your smoking history, I need an accurate estimate of how many packs of cigarettes you smoked per day and for how many years. (Not just the number of PPD you are now smoking)   Years smoking 62 x Packs per day 1.5-2 = Pack years 108   (at least 20 pack yrs)   (Make sure they understand that we need to know how much they have smoked in the past, not just the number of PPD they are smoking now)  Do you have a personal history of cancer?  Yes - (type and when diagnosed - 5 yrs cancer free) Bladder ( gets checked every 6 months)    Do you have a family history of cancer? No  Are you coughing up blood?  No  Have you had unexplained weight loss of 15 lbs or more in the last 6 months? No  It looks like you meet all criteria.     Additional information: N/A

## 2023-09-29 NOTE — Telephone Encounter (Signed)
 Dr Meade- are you able to add a risk assessment to your recent ov note? Thanks!

## 2023-09-30 ENCOUNTER — Encounter: Payer: Self-pay | Admitting: Gastroenterology

## 2023-10-02 ENCOUNTER — Encounter: Payer: Self-pay | Admitting: Adult Health

## 2023-10-02 DIAGNOSIS — H04123 Dry eye syndrome of bilateral lacrimal glands: Secondary | ICD-10-CM | POA: Diagnosis not present

## 2023-10-02 DIAGNOSIS — H5203 Hypermetropia, bilateral: Secondary | ICD-10-CM | POA: Diagnosis not present

## 2023-10-02 DIAGNOSIS — H31092 Other chorioretinal scars, left eye: Secondary | ICD-10-CM | POA: Diagnosis not present

## 2023-10-02 DIAGNOSIS — S0502XA Injury of conjunctiva and corneal abrasion without foreign body, left eye, initial encounter: Secondary | ICD-10-CM | POA: Diagnosis not present

## 2023-10-02 DIAGNOSIS — H43811 Vitreous degeneration, right eye: Secondary | ICD-10-CM | POA: Diagnosis not present

## 2023-10-02 NOTE — Patient Instructions (Signed)

## 2023-10-02 NOTE — Telephone Encounter (Signed)
 This is a preoperative pulmonary evaluation for Michael Horn for left total knee arthroplasty.  ASSESSMENT: Michael Horn has a low risk of post-operative pulmonary complications by ARISCAT Index.  The absolute assessment of risk/benefit of the procedure is deferred to the primary team's evaluation.  RECOMMENDATIONS:  In order to minimize the risk of complications and optimize pulmonary status, we recommend the following: - Encourage aggressive incentive spirometry hourly both peri-operatively and post-operatively as tolerated  - Early ambulation and physical therapy as tolerated post-operatively - Adequate pain control especially in the setting of abdominal and thoracic surgery - Bronchodilators as needed for wheezing or shortness of breath - Oral steroids only if the patient appears to have component of COPD exacerbation, otherwise no routine utilization needed - Ideally recommend smoking cessation for >4 weeks before any intervention - Intraoperatively keep OR time to the shortest as possible - Post operatively may benefit from Nocturnal Bipap  Preoperative Risk Calculation: Postoperative respiratory failure (PRF) is considered as failure to wean from mechanical ventilation within 48 hours of surgery or unplanned intubation/reintubation postoperatively. The validated risk calculator provides a risk estimate of PRF and is anticipated to aid in surgical decision-making and informed patient consent.  However risk can be accepted given the potential benefit of this intervention and it is not prohibitive.  The features of this patient's history that contribute to the pulmonary risk assessment include:  Age, COPD, General anesthesia  14 points 0 to 25 points: Low risk: 1.6% pulmonary complication rate  26 to 44 points: Intermediate risk: 13.3% pulmonary complication rate  45 to 123 points: High risk: 42.1% pulmonary complication rate   ARISCAT: Mazo et al. Anesthesiology 2014;  [phone]  Michael Gore, MD Pulmonary and Critical Care Medicine [facility] 10/02/2023 3:10 PM Pager: see AMION  If no response to pager, please call critical care on call (see AMION) until 7pm After 7:00 pm call Elink

## 2023-10-02 NOTE — Progress Notes (Unsigned)
  Virtual Visit via Telephone Note  I connected with RANDOM DOBROWSKI , 10/03/23 11:14 AM by a telemedicine application and verified that I am speaking with the correct person using two identifiers.  Location: Patient: home Provider: home   I discussed the limitations of evaluation and management by telemedicine and the availability of in person appointments. The patient expressed understanding and agreed to proceed.   Shared Decision Making Visit Lung Cancer Screening Program [phone])   Eligibility: 73 y.o. Pack Years Smoking History Calculation = 108 pack years  (# packs/per year x # years smoked) Recent History of coughing up blood  no Unexplained weight loss? no ( >Than 15 pounds within the last 6 months ) Prior History Lung / other cancer no (Diagnosis within the last 5 years already requiring surveillance chest CT Scans). Smoking Status Current Smoker   Visit Components: Discussion included one or more decision making aids. YES Discussion included risk/benefits of screening. YES Discussion included potential follow up diagnostic testing for abnormal scans. YES Discussion included meaning and risk of over diagnosis. YES Discussion included meaning and risk of False Positives. YES Discussion included meaning of total radiation exposure. YES  Counseling Included: Importance of adherence to annual lung cancer LDCT screening. YES Impact of comorbidities on ability to participate in the program. YES Ability and willingness to under diagnostic treatment. YES  Smoking Cessation Counseling: Current Smokers:  Discussed importance of smoking cessation. yes Information about tobacco cessation classes and interventions provided to patient. yes Patient provided with ticket for LDCT Scan. yes Symptomatic Patient. NO Diagnosis Code: Tobacco Use Z72.0 Asymptomatic Patient yes  Counseling - 4 minutes of smoking cessation counseling (CT Chest Lung Cancer Screening Low Dose W/O  CM) [id number]  Z12.2-Screening of respiratory organs Z87.891-Personal history of nicotine  dependence   Lamarr Myers 10/03/23

## 2023-10-03 ENCOUNTER — Ambulatory Visit (INDEPENDENT_AMBULATORY_CARE_PROVIDER_SITE_OTHER): Admitting: Adult Health

## 2023-10-03 DIAGNOSIS — F17210 Nicotine dependence, cigarettes, uncomplicated: Secondary | ICD-10-CM

## 2023-10-03 NOTE — Telephone Encounter (Signed)
 Copy of this risk assessment faxed to Raford Beers

## 2023-10-06 ENCOUNTER — Ambulatory Visit (INDEPENDENT_AMBULATORY_CARE_PROVIDER_SITE_OTHER)
Admission: RE | Admit: 2023-10-06 | Discharge: 2023-10-06 | Disposition: A | Discharge status: Home / Self Care | Source: Ambulatory Visit | Attending: Acute Care | Admitting: Acute Care

## 2023-10-06 DIAGNOSIS — Z87891 Personal history of nicotine dependence: Secondary | ICD-10-CM

## 2023-10-06 DIAGNOSIS — Z122 Encounter for screening for malignant neoplasm of respiratory organs: Secondary | ICD-10-CM

## 2023-10-06 DIAGNOSIS — F17210 Nicotine dependence, cigarettes, uncomplicated: Secondary | ICD-10-CM

## 2023-10-08 ENCOUNTER — Ambulatory Visit: Admitting: Podiatry

## 2023-10-13 DIAGNOSIS — Z720 Tobacco use: Secondary | ICD-10-CM | POA: Diagnosis not present

## 2023-10-13 DIAGNOSIS — Z92241 Personal history of systemic steroid therapy: Secondary | ICD-10-CM | POA: Diagnosis not present

## 2023-10-13 DIAGNOSIS — Z794 Long term (current) use of insulin: Secondary | ICD-10-CM | POA: Diagnosis not present

## 2023-10-13 DIAGNOSIS — I10 Essential (primary) hypertension: Secondary | ICD-10-CM | POA: Diagnosis not present

## 2023-10-13 DIAGNOSIS — I700 Atherosclerosis of aorta: Secondary | ICD-10-CM | POA: Diagnosis not present

## 2023-10-13 DIAGNOSIS — E1142 Type 2 diabetes mellitus with diabetic polyneuropathy: Secondary | ICD-10-CM | POA: Diagnosis not present

## 2023-10-15 DIAGNOSIS — M1712 Unilateral primary osteoarthritis, left knee: Secondary | ICD-10-CM | POA: Diagnosis not present

## 2023-10-17 ENCOUNTER — Other Ambulatory Visit

## 2023-10-17 DIAGNOSIS — D509 Iron deficiency anemia, unspecified: Secondary | ICD-10-CM | POA: Diagnosis not present

## 2023-10-17 LAB — CBC WITH DIFFERENTIAL/PLATELET
Basophils Absolute: 0 K/uL (ref 0.0–0.1)
Basophils Relative: 0.4 % (ref 0.0–3.0)
Eosinophils Absolute: 0.1 K/uL (ref 0.0–0.7)
Eosinophils Relative: 1.9 % (ref 0.0–5.0)
HCT: 44.8 % (ref 39.0–52.0)
Hemoglobin: 14.8 g/dL (ref 13.0–17.0)
Lymphocytes Relative: 19.6 % (ref 12.0–46.0)
Lymphs Abs: 1.5 K/uL (ref 0.7–4.0)
MCHC: 33 g/dL (ref 30.0–36.0)
MCV: 78 fl (ref 78.0–100.0)
Monocytes Absolute: 0.5 K/uL (ref 0.1–1.0)
Monocytes Relative: 5.9 % (ref 3.0–12.0)
Neutro Abs: 5.6 K/uL (ref 1.4–7.7)
Neutrophils Relative %: 72.2 % (ref 43.0–77.0)
Platelets: 185 K/uL (ref 150.0–400.0)
RBC: 5.74 Mil/uL (ref 4.22–5.81)
RDW: 19.6 % — ABNORMAL HIGH (ref 11.5–15.5)
WBC: 7.8 K/uL (ref 4.0–10.5)

## 2023-10-17 LAB — IBC + FERRITIN
Ferritin: 28.4 ng/mL (ref 22.0–322.0)
Iron: 83 ug/dL (ref 42–165)
Saturation Ratios: 24.2 % (ref 20.0–50.0)
TIBC: 343 ug/dL (ref 250.0–450.0)
Transferrin: 245 mg/dL (ref 212.0–360.0)

## 2023-10-18 ENCOUNTER — Ambulatory Visit: Payer: Self-pay | Admitting: Gastroenterology

## 2023-10-21 ENCOUNTER — Other Ambulatory Visit: Payer: Self-pay

## 2023-10-21 DIAGNOSIS — Z87891 Personal history of nicotine dependence: Secondary | ICD-10-CM

## 2023-10-21 DIAGNOSIS — F17210 Nicotine dependence, cigarettes, uncomplicated: Secondary | ICD-10-CM

## 2023-10-21 DIAGNOSIS — Z122 Encounter for screening for malignant neoplasm of respiratory organs: Secondary | ICD-10-CM

## 2023-10-22 DIAGNOSIS — J9811 Atelectasis: Secondary | ICD-10-CM | POA: Diagnosis not present

## 2023-10-22 DIAGNOSIS — Z79899 Other long term (current) drug therapy: Secondary | ICD-10-CM | POA: Diagnosis not present

## 2023-10-22 DIAGNOSIS — E559 Vitamin D deficiency, unspecified: Secondary | ICD-10-CM | POA: Diagnosis not present

## 2023-10-22 DIAGNOSIS — M79609 Pain in unspecified limb: Secondary | ICD-10-CM | POA: Diagnosis not present

## 2023-10-22 DIAGNOSIS — Z01818 Encounter for other preprocedural examination: Secondary | ICD-10-CM | POA: Diagnosis not present

## 2023-10-23 DIAGNOSIS — Z01818 Encounter for other preprocedural examination: Secondary | ICD-10-CM | POA: Diagnosis not present

## 2023-10-23 DIAGNOSIS — J9811 Atelectasis: Secondary | ICD-10-CM | POA: Diagnosis not present

## 2023-10-24 DIAGNOSIS — F17200 Nicotine dependence, unspecified, uncomplicated: Secondary | ICD-10-CM | POA: Diagnosis not present

## 2023-10-24 DIAGNOSIS — J449 Chronic obstructive pulmonary disease, unspecified: Secondary | ICD-10-CM | POA: Diagnosis not present

## 2023-10-24 DIAGNOSIS — I10 Essential (primary) hypertension: Secondary | ICD-10-CM | POA: Diagnosis not present

## 2023-10-24 DIAGNOSIS — E782 Mixed hyperlipidemia: Secondary | ICD-10-CM | POA: Diagnosis not present

## 2023-10-24 DIAGNOSIS — E1142 Type 2 diabetes mellitus with diabetic polyneuropathy: Secondary | ICD-10-CM | POA: Diagnosis not present

## 2023-10-29 ENCOUNTER — Encounter: Payer: Self-pay | Admitting: Orthopedic Surgery

## 2023-11-06 DIAGNOSIS — M25552 Pain in left hip: Secondary | ICD-10-CM | POA: Diagnosis not present

## 2023-11-06 DIAGNOSIS — M1712 Unilateral primary osteoarthritis, left knee: Secondary | ICD-10-CM | POA: Diagnosis not present

## 2023-11-13 DIAGNOSIS — R0902 Hypoxemia: Secondary | ICD-10-CM | POA: Diagnosis not present

## 2023-11-13 DIAGNOSIS — G4730 Sleep apnea, unspecified: Secondary | ICD-10-CM | POA: Diagnosis not present

## 2023-11-17 DIAGNOSIS — G894 Chronic pain syndrome: Secondary | ICD-10-CM | POA: Diagnosis not present

## 2023-11-17 DIAGNOSIS — M62830 Muscle spasm of back: Secondary | ICD-10-CM | POA: Diagnosis not present

## 2023-11-17 DIAGNOSIS — M25561 Pain in right knee: Secondary | ICD-10-CM | POA: Diagnosis not present

## 2023-11-17 DIAGNOSIS — M47817 Spondylosis without myelopathy or radiculopathy, lumbosacral region: Secondary | ICD-10-CM | POA: Diagnosis not present

## 2023-11-18 ENCOUNTER — Telehealth: Payer: Self-pay | Admitting: *Deleted

## 2023-11-18 ENCOUNTER — Encounter: Payer: Self-pay | Admitting: Internal Medicine

## 2023-11-18 ENCOUNTER — Ambulatory Visit: Payer: Self-pay | Admitting: Internal Medicine

## 2023-11-18 NOTE — Telephone Encounter (Signed)
 ONO received and given to Dr. Meade for review

## 2023-11-18 NOTE — Telephone Encounter (Signed)
 Copied from CRM [id number]. Topic: Clinical - Lab/Test Results >> Nov 17, 2023  5:17 PM Rozanna G wrote: Reason for CRM: pt callin about his breathing/oxygen test was in and he wants speak with provider about results. Pt stated he is having a knee replacement on Wednesday 09/10 and would like with you all before then.    I called and spoke with the pt  He said he was returning call for ONO results- someone left him vm to call us  back on 11/17/23  I do not see any documentation of this  He states that ONO was done by [employer] I have sent them community msg to fax the results  Will await fax and then give to Dr Meade for review Routing to clinical pool for continued f/u

## 2023-11-18 NOTE — Telephone Encounter (Signed)
 Received ONO on room air which qualifies him for home oxygen. Please new start oxygen 2LNC at night.

## 2023-11-19 ENCOUNTER — Other Ambulatory Visit: Payer: Self-pay | Admitting: *Deleted

## 2023-11-19 DIAGNOSIS — E119 Type 2 diabetes mellitus without complications: Secondary | ICD-10-CM | POA: Diagnosis not present

## 2023-11-19 DIAGNOSIS — G4733 Obstructive sleep apnea (adult) (pediatric): Secondary | ICD-10-CM | POA: Diagnosis not present

## 2023-11-19 DIAGNOSIS — J449 Chronic obstructive pulmonary disease, unspecified: Secondary | ICD-10-CM | POA: Diagnosis not present

## 2023-11-19 DIAGNOSIS — Z794 Long term (current) use of insulin: Secondary | ICD-10-CM | POA: Diagnosis not present

## 2023-11-19 DIAGNOSIS — Z8551 Personal history of malignant neoplasm of bladder: Secondary | ICD-10-CM | POA: Diagnosis not present

## 2023-11-19 DIAGNOSIS — G8929 Other chronic pain: Secondary | ICD-10-CM | POA: Diagnosis not present

## 2023-11-19 DIAGNOSIS — Z23 Encounter for immunization: Secondary | ICD-10-CM | POA: Diagnosis not present

## 2023-11-19 DIAGNOSIS — Z79899 Other long term (current) drug therapy: Secondary | ICD-10-CM | POA: Diagnosis not present

## 2023-11-19 DIAGNOSIS — Z7984 Long term (current) use of oral hypoglycemic drugs: Secondary | ICD-10-CM | POA: Diagnosis not present

## 2023-11-19 DIAGNOSIS — M549 Dorsalgia, unspecified: Secondary | ICD-10-CM | POA: Diagnosis not present

## 2023-11-19 DIAGNOSIS — G4734 Idiopathic sleep related nonobstructive alveolar hypoventilation: Secondary | ICD-10-CM

## 2023-11-19 DIAGNOSIS — F17210 Nicotine dependence, cigarettes, uncomplicated: Secondary | ICD-10-CM | POA: Diagnosis not present

## 2023-11-19 DIAGNOSIS — Z471 Aftercare following joint replacement surgery: Secondary | ICD-10-CM | POA: Diagnosis not present

## 2023-11-19 DIAGNOSIS — M1712 Unilateral primary osteoarthritis, left knee: Secondary | ICD-10-CM | POA: Diagnosis not present

## 2023-11-19 DIAGNOSIS — Z7982 Long term (current) use of aspirin: Secondary | ICD-10-CM | POA: Diagnosis not present

## 2023-11-19 DIAGNOSIS — Z96652 Presence of left artificial knee joint: Secondary | ICD-10-CM | POA: Diagnosis not present

## 2023-11-19 DIAGNOSIS — E7800 Pure hypercholesterolemia, unspecified: Secondary | ICD-10-CM | POA: Diagnosis not present

## 2023-11-19 DIAGNOSIS — G8918 Other acute postprocedural pain: Secondary | ICD-10-CM | POA: Diagnosis not present

## 2023-11-19 DIAGNOSIS — Z91199 Patient's noncompliance with other medical treatment and regimen due to unspecified reason: Secondary | ICD-10-CM | POA: Diagnosis not present

## 2023-11-19 NOTE — Telephone Encounter (Signed)
 ONO reviewed. He requires nocturnal oxygen - I have placed the order separately if you can make sure he gets it. Needs to wear 2LNC at night.

## 2023-11-19 NOTE — Telephone Encounter (Signed)
Lm x1 for pt.

## 2023-11-19 NOTE — Progress Notes (Signed)
 ATC x1.  LVM to return call.  Sent mychart message as well.

## 2023-11-20 ENCOUNTER — Telehealth: Payer: Self-pay | Admitting: Internal Medicine

## 2023-11-20 ENCOUNTER — Telehealth: Payer: Self-pay

## 2023-11-20 NOTE — Telephone Encounter (Signed)
 Patient is intolerant of pap therapy, he does not wear it

## 2023-11-20 NOTE — Telephone Encounter (Signed)
 Per Avelina from [employer] Due to PT OSA DX we will need a Titrated sleep study showing the pt still desats while on pap therapy.

## 2023-11-20 NOTE — Progress Notes (Signed)
 Called and spoke with patient's wife, Neil (HAWAII), I advised her of the results/recommendations per Dr. Meade.  I let her know that the DME company will call them to arrange getting the oxygen to their home.  She verbalized understanding.  She stated that he is coming home from the hospital from having knee surgery.

## 2023-11-20 NOTE — Telephone Encounter (Signed)
 Copied from CRM [id number]. Topic: General - Other >> Nov 19, 2023 11:23 AM Rilla NOVAK wrote: Reason for CRM: Patient's wife calling.  Patient had knee replacement and they missed a call from [place].  Please call [phone]  ONO reviewed. He requires nocturnal oxygen - I have placed the order separately if you can make sure he gets it. Needs to wear 2LNC at night.       Lm for patient.

## 2023-11-20 NOTE — Telephone Encounter (Signed)
 Received call from [facility] with [employer], patient will have to still have to have a titrated sleep study per insurance even though patient is intolerant of pap therapy.

## 2023-11-20 NOTE — Telephone Encounter (Signed)
 Send to DME company. NFN

## 2023-11-20 NOTE — Telephone Encounter (Signed)
 Called and spoke with patient, he stated he was in the hospital, he had a knee replacement.  I told him I hoped his recovery from his knee replacement went well.  He stated that his wife called this morning and would like to speak with our office.  Advised I would call and speak with her.

## 2023-11-20 NOTE — Telephone Encounter (Signed)
 Please see duplicate phone note dated as 11/20/2023

## 2023-11-21 DIAGNOSIS — Z8551 Personal history of malignant neoplasm of bladder: Secondary | ICD-10-CM | POA: Diagnosis not present

## 2023-11-21 DIAGNOSIS — F17210 Nicotine dependence, cigarettes, uncomplicated: Secondary | ICD-10-CM | POA: Diagnosis not present

## 2023-11-21 DIAGNOSIS — E7800 Pure hypercholesterolemia, unspecified: Secondary | ICD-10-CM | POA: Diagnosis not present

## 2023-11-21 DIAGNOSIS — G479 Sleep disorder, unspecified: Secondary | ICD-10-CM | POA: Diagnosis not present

## 2023-11-21 DIAGNOSIS — Z9181 History of falling: Secondary | ICD-10-CM | POA: Diagnosis not present

## 2023-11-21 DIAGNOSIS — Z7951 Long term (current) use of inhaled steroids: Secondary | ICD-10-CM | POA: Diagnosis not present

## 2023-11-21 DIAGNOSIS — M47812 Spondylosis without myelopathy or radiculopathy, cervical region: Secondary | ICD-10-CM | POA: Diagnosis not present

## 2023-11-21 DIAGNOSIS — Z7982 Long term (current) use of aspirin: Secondary | ICD-10-CM | POA: Diagnosis not present

## 2023-11-21 DIAGNOSIS — Z471 Aftercare following joint replacement surgery: Secondary | ICD-10-CM | POA: Diagnosis not present

## 2023-11-21 DIAGNOSIS — Z96652 Presence of left artificial knee joint: Secondary | ICD-10-CM | POA: Diagnosis not present

## 2023-11-21 DIAGNOSIS — Z791 Long term (current) use of non-steroidal anti-inflammatories (NSAID): Secondary | ICD-10-CM | POA: Diagnosis not present

## 2023-11-21 DIAGNOSIS — F339 Major depressive disorder, recurrent, unspecified: Secondary | ICD-10-CM | POA: Diagnosis not present

## 2023-11-21 DIAGNOSIS — J449 Chronic obstructive pulmonary disease, unspecified: Secondary | ICD-10-CM | POA: Diagnosis not present

## 2023-11-21 DIAGNOSIS — I10 Essential (primary) hypertension: Secondary | ICD-10-CM | POA: Diagnosis not present

## 2023-11-21 DIAGNOSIS — E119 Type 2 diabetes mellitus without complications: Secondary | ICD-10-CM | POA: Diagnosis not present

## 2023-11-21 DIAGNOSIS — Z794 Long term (current) use of insulin: Secondary | ICD-10-CM | POA: Diagnosis not present

## 2023-11-21 DIAGNOSIS — Z7984 Long term (current) use of oral hypoglycemic drugs: Secondary | ICD-10-CM | POA: Diagnosis not present

## 2023-11-21 DIAGNOSIS — Z7985 Long-term (current) use of injectable non-insulin antidiabetic drugs: Secondary | ICD-10-CM | POA: Diagnosis not present

## 2023-11-21 DIAGNOSIS — G8929 Other chronic pain: Secondary | ICD-10-CM | POA: Diagnosis not present

## 2023-11-21 NOTE — Telephone Encounter (Signed)
 Please advise on titration

## 2023-11-24 NOTE — Telephone Encounter (Signed)
 He has a titration study from 2023 can they use that

## 2023-11-26 NOTE — Telephone Encounter (Signed)
 Sent this information to [employer]

## 2023-11-26 NOTE — Telephone Encounter (Signed)
 Can [employer] use titration study from 2023?

## 2023-11-27 NOTE — Telephone Encounter (Signed)
 His ono is in the computer located in the media tab within the last 30 days that qualifies him for oxygen.ONO completed on 11/13/2023 he just needs nocturnal oxygen,does this need to be faxed to them for them to see or anything.

## 2023-11-27 NOTE — Telephone Encounter (Signed)
 Per Avelina from [employer] we qualify a patient for out patient O2 testing expires after 30days

## 2023-11-27 NOTE — Telephone Encounter (Signed)
 Spoke to [place] with [employer] about this. She said she needs a titrated sleep study, not the ONO, for insurance purposes.

## 2023-11-27 NOTE — Telephone Encounter (Signed)
 This is absurd. I'm not prescribing PAP therapy. I'm prescribing overnight oxygen therapy and he has oxygen testing from the last 30 days. Why is [employer] being so difficult.

## 2023-12-01 NOTE — Telephone Encounter (Signed)
 The reason a sleep study is needed, is because patient has a hx of OSA. Insurance requires a sleep study to determine if the OSA is causing the desats.

## 2023-12-05 DIAGNOSIS — M25562 Pain in left knee: Secondary | ICD-10-CM | POA: Diagnosis not present

## 2023-12-05 DIAGNOSIS — M25662 Stiffness of left knee, not elsewhere classified: Secondary | ICD-10-CM | POA: Diagnosis not present

## 2023-12-09 DIAGNOSIS — M25562 Pain in left knee: Secondary | ICD-10-CM | POA: Diagnosis not present

## 2023-12-09 DIAGNOSIS — M25662 Stiffness of left knee, not elsewhere classified: Secondary | ICD-10-CM | POA: Diagnosis not present

## 2023-12-12 DIAGNOSIS — M25562 Pain in left knee: Secondary | ICD-10-CM | POA: Diagnosis not present

## 2023-12-12 DIAGNOSIS — M25662 Stiffness of left knee, not elsewhere classified: Secondary | ICD-10-CM | POA: Diagnosis not present

## 2023-12-16 DIAGNOSIS — M25662 Stiffness of left knee, not elsewhere classified: Secondary | ICD-10-CM | POA: Diagnosis not present

## 2023-12-16 DIAGNOSIS — M25562 Pain in left knee: Secondary | ICD-10-CM | POA: Diagnosis not present

## 2023-12-19 ENCOUNTER — Telehealth: Payer: Self-pay

## 2023-12-19 NOTE — Telephone Encounter (Signed)
  Copied from CRM [id number]. Topic: Clinical - Order For Equipment >> Dec 17, 2023 12:36 PM Russell PARAS wrote: Reason for CRM:    Pt is contacting office regarding order for oxygen at night. Reveiwed chart and advised order was sent to [employer]; however, the DME company has not contacted pt.    Advised to contact [employer], pt requested call back from clinic with update regarding the order for oxygen.    CB#   [phone]

## 2023-12-19 NOTE — Telephone Encounter (Signed)
 Copied from CRM [id number]. Topic: Clinical - Order For Equipment >> Dec 17, 2023 12:36 PM Russell PARAS wrote: Reason for CRM:   Pt is contacting office regarding order for oxygen at night. Reveiwed chart and advised order was sent to [employer]; however, the DME company has not contacted pt.   Advised to contact [employer], pt requested call back from clinic with update regarding the order for oxygen.   CB#  [phone]

## 2023-12-22 NOTE — Telephone Encounter (Signed)
 Titrated sleep study from within 30 days is needed per Avelina from [employer]

## 2023-12-23 ENCOUNTER — Other Ambulatory Visit: Payer: Self-pay

## 2023-12-23 ENCOUNTER — Telehealth: Payer: Self-pay | Admitting: Internal Medicine

## 2023-12-23 DIAGNOSIS — M25562 Pain in left knee: Secondary | ICD-10-CM | POA: Diagnosis not present

## 2023-12-23 DIAGNOSIS — M25662 Stiffness of left knee, not elsewhere classified: Secondary | ICD-10-CM | POA: Diagnosis not present

## 2023-12-23 DIAGNOSIS — G4734 Idiopathic sleep related nonobstructive alveolar hypoventilation: Secondary | ICD-10-CM

## 2023-12-23 NOTE — Telephone Encounter (Signed)
 Patient refused to have titration study used foul language and refused to have it done stating that he had already had too many sleep studies done and that if we didn't want to help him so be it and then hung up. Cancelling titration study and closing referral.

## 2023-12-23 NOTE — Telephone Encounter (Addendum)
 For a ONO ?      Order  Titrated

## 2023-12-24 NOTE — Telephone Encounter (Signed)
 Noted

## 2023-12-26 DIAGNOSIS — M25662 Stiffness of left knee, not elsewhere classified: Secondary | ICD-10-CM | POA: Diagnosis not present

## 2023-12-26 DIAGNOSIS — M25562 Pain in left knee: Secondary | ICD-10-CM | POA: Diagnosis not present

## 2023-12-29 DIAGNOSIS — M25562 Pain in left knee: Secondary | ICD-10-CM | POA: Diagnosis not present

## 2023-12-29 DIAGNOSIS — M25662 Stiffness of left knee, not elsewhere classified: Secondary | ICD-10-CM | POA: Diagnosis not present

## 2024-01-02 DIAGNOSIS — M25562 Pain in left knee: Secondary | ICD-10-CM | POA: Diagnosis not present

## 2024-01-02 DIAGNOSIS — M25662 Stiffness of left knee, not elsewhere classified: Secondary | ICD-10-CM | POA: Diagnosis not present

## 2024-01-06 DIAGNOSIS — M25662 Stiffness of left knee, not elsewhere classified: Secondary | ICD-10-CM | POA: Diagnosis not present

## 2024-01-06 DIAGNOSIS — M25562 Pain in left knee: Secondary | ICD-10-CM | POA: Diagnosis not present

## 2024-01-09 DIAGNOSIS — D2239 Melanocytic nevi of other parts of face: Secondary | ICD-10-CM | POA: Diagnosis not present

## 2024-01-09 DIAGNOSIS — L820 Inflamed seborrheic keratosis: Secondary | ICD-10-CM | POA: Diagnosis not present

## 2024-01-12 ENCOUNTER — Telehealth: Payer: Self-pay

## 2024-01-12 DIAGNOSIS — D509 Iron deficiency anemia, unspecified: Secondary | ICD-10-CM

## 2024-01-12 NOTE — Telephone Encounter (Signed)
-----   Message from [facility] Clarita H sent at 10/20/2023  8:15 AM EDT ----- Regarding: labs due in Nov CBC and TIBC / ferritin due in 3 mos = Nov

## 2024-01-12 NOTE — Telephone Encounter (Signed)
 Orders entered. MyChart message to patient to go to the labs in the next week or 2

## 2024-01-13 DIAGNOSIS — M62830 Muscle spasm of back: Secondary | ICD-10-CM | POA: Diagnosis not present

## 2024-01-13 DIAGNOSIS — G894 Chronic pain syndrome: Secondary | ICD-10-CM | POA: Diagnosis not present

## 2024-01-13 DIAGNOSIS — M25662 Stiffness of left knee, not elsewhere classified: Secondary | ICD-10-CM | POA: Diagnosis not present

## 2024-01-13 DIAGNOSIS — M25562 Pain in left knee: Secondary | ICD-10-CM | POA: Diagnosis not present

## 2024-01-13 DIAGNOSIS — M25561 Pain in right knee: Secondary | ICD-10-CM | POA: Diagnosis not present

## 2024-01-13 DIAGNOSIS — M47817 Spondylosis without myelopathy or radiculopathy, lumbosacral region: Secondary | ICD-10-CM | POA: Diagnosis not present

## 2024-01-15 NOTE — Telephone Encounter (Signed)
Called and spoke to patient. He will go to the lab one day next week.

## 2024-01-16 DIAGNOSIS — M25662 Stiffness of left knee, not elsewhere classified: Secondary | ICD-10-CM | POA: Diagnosis not present

## 2024-01-16 DIAGNOSIS — M25562 Pain in left knee: Secondary | ICD-10-CM | POA: Diagnosis not present

## 2024-01-23 DIAGNOSIS — Z96652 Presence of left artificial knee joint: Secondary | ICD-10-CM | POA: Diagnosis not present

## 2024-01-23 DIAGNOSIS — M25662 Stiffness of left knee, not elsewhere classified: Secondary | ICD-10-CM | POA: Diagnosis not present

## 2024-01-23 DIAGNOSIS — M25562 Pain in left knee: Secondary | ICD-10-CM | POA: Diagnosis not present

## 2024-01-23 DIAGNOSIS — M1712 Unilateral primary osteoarthritis, left knee: Secondary | ICD-10-CM | POA: Diagnosis not present

## 2024-01-23 DIAGNOSIS — Z96651 Presence of right artificial knee joint: Secondary | ICD-10-CM | POA: Diagnosis not present

## 2024-01-29 DIAGNOSIS — M25562 Pain in left knee: Secondary | ICD-10-CM | POA: Diagnosis not present

## 2024-01-29 DIAGNOSIS — M25662 Stiffness of left knee, not elsewhere classified: Secondary | ICD-10-CM | POA: Diagnosis not present

## 2024-02-10 DIAGNOSIS — M25562 Pain in left knee: Secondary | ICD-10-CM | POA: Diagnosis not present

## 2024-02-10 DIAGNOSIS — M25662 Stiffness of left knee, not elsewhere classified: Secondary | ICD-10-CM | POA: Diagnosis not present

## 2024-02-13 DIAGNOSIS — M25662 Stiffness of left knee, not elsewhere classified: Secondary | ICD-10-CM | POA: Diagnosis not present

## 2024-02-13 DIAGNOSIS — M25562 Pain in left knee: Secondary | ICD-10-CM | POA: Diagnosis not present

## 2024-02-13 NOTE — Progress Notes (Addendum)
 Michael Horn                                          MRN: [id number]   03/16/2024   The [facility] Quality Team Specialist reviewed this patient medical record for the purposes of chart review for care gap closure. The following were reviewed: chart review for care gap closure-glycemic status assessment.    [facility] Quality Team

## 2024-02-17 DIAGNOSIS — R3915 Urgency of urination: Secondary | ICD-10-CM | POA: Diagnosis not present

## 2024-02-17 DIAGNOSIS — C678 Malignant neoplasm of overlapping sites of bladder: Secondary | ICD-10-CM | POA: Diagnosis not present

## 2024-02-20 ENCOUNTER — Ambulatory Visit (HOSPITAL_BASED_OUTPATIENT_CLINIC_OR_DEPARTMENT_OTHER): Admitting: Pulmonary Disease

## 2024-02-25 ENCOUNTER — Ambulatory Visit: Admitting: Podiatry

## 2024-02-25 DIAGNOSIS — M722 Plantar fascial fibromatosis: Secondary | ICD-10-CM

## 2024-02-25 DIAGNOSIS — M76821 Posterior tibial tendinitis, right leg: Secondary | ICD-10-CM | POA: Diagnosis not present

## 2024-02-25 DIAGNOSIS — M79671 Pain in right foot: Secondary | ICD-10-CM

## 2024-02-25 NOTE — Progress Notes (Unsigned)
 Chief Complaint  Patient presents with   Foot Pain    HX of Plantar Fascitis in theRight foot, mid-arch pain that does spread to the medial side. Present for 5 days. He had knee replacement 3 months ago on the left knee and has been putting more weight on it.  Would like ijection     Discussed the use of AI scribe software for clinical note transcription with the patient, who gave verbal consent to proceed.  History of Present Illness      Past Medical History:  Diagnosis Date   Aortic atherosclerosis    Arthritis    Arthritis -DDD.chronic pain med used   Atrial fibrillation [facility])    Bladder tumor    states that it was cancer   Cancer [facility])    Bladder cancer-Dr.Ottelin- checks every 3 months--no signs last checks.   Chronic knee pain RIGHT KNEE --  S/P KNEE REPLACEMENT JAN 2012--  PT STATES  NEEDS ANOTHER REPLACEMENT DUE TO JOINT RECALL   Complication of anesthesia EMERGENCE DELIRIUM   occurred x1- 2 yrs ago.few surgeries ago, none recently   COPD (chronic obstructive pulmonary disease) (HCC)    Coronary artery disease    Depression    Diverticulosis    DM type 2 (diabetes mellitus, type 2) (HCC)    novolog  BID and oral agents   GERD (gastroesophageal reflux disease)    H/O hiatal hernia    Headache    History of bladder cancer TCC OF BLADDER  --  FOLLOWED BY DR CEIL   History of peptic ulcer AGE 22   Hypertension    pt denies, takes lisimopril for kidneys   Internal hemorrhoids    Mild obstructive sleep apnea    CPAP   New onset a-fib (HCC) 02/28/2022   with RVR, during hospitalization, will use 30 day heart monitor s/p urology surgery   Nocturia    Peripheral neuropathy BOTTOM RIGHT FOOT   Seasonal allergies    Short of breath on exertion    Urgency of urination    Past Surgical History:  Procedure Laterality Date   BACK SURGERY  X2   lower back   BILATERAL KNEE ARTHROSCOPY     BILATERAL SHOULDER SURG.  2003   CARDIAC CATHETERIZATION  04-13-2007    ---- DR DANN   NO SIGNIFICANT CAD/ NORMAL LVF   CARPAL TUNNEL RELEASE  04/11/2006   RIGHT   COLONOSCOPY WITH PROPOFOL  N/A 05/01/2015   Procedure: COLONOSCOPY WITH PROPOFOL ;  Surgeon: Gladis MARLA Louder, MD;  Location: [facility];  Service: Endoscopy;  Laterality: N/A;   CYSTOSCOPY W/ RETROGRADES Bilateral 12/23/2019   Procedure: CYSTOSCOPY WITH RETROGRADE PYELOGRAM;  Surgeon: Matilda Senior, MD;  Location: [facility];  Service: Urology;  Laterality: Bilateral;   HAND SURGERY Right    cyst removed   LEFT CARPAL. TUNNEL RELEASE     LEFT EYE SURG.   2008   REMOVAL OF BB   REPAIR RECURRENT LEFT ROTATOR CUFF TEAR  01/02/2011   REVERSE SHOULDER ARTHROPLASTY Left 04/08/2018   Procedure: REVERSE SHOULDER ARTHROPLASTY;  Surgeon: Sharl Selinda Dover, MD;  Location: [facility] OR;  Service: Orthopedics;  Laterality: Left;  2.5 hours   SIGMOID COLECTOMY  09/29/2001   DIVERTICULITIS   TOTAL KNEE ARTHROPLASTY  04/03/2010   RIGHT   TRANSURETHRAL RESECTION OF BLADDER TUMOR  07/06/2010   TRANSURETHRAL RESECTION OF BLADDER TUMOR  05/20/2011   Procedure: TRANSURETHRAL RESECTION OF BLADDER TUMOR (TURBT);  Surgeon: Mark C Ottelin, MD;  Location: [facility];  Service: Urology;  Laterality: N/A;  GYRUS INSTILL MYTOMYCIN C NO BED   TRANSURETHRAL RESECTION OF BLADDER TUMOR  01/31/2012   Procedure: TRANSURETHRAL RESECTION OF BLADDER TUMOR (TURBT);  Surgeon: Mark C Ottelin, MD;  [facility];  Service: Urology;  Laterality: N/A;   TRANSURETHRAL RESECTION OF BLADDER TUMOR WITH GYRUS (TURBT-GYRUS) N/A 11/22/2013   Procedure: Bladder biopsy with fulgeration;  Surgeon: Oneil JAYSON Rafter, MD;  Location: [facility];  Service: Urology;  Laterality: N/A;   TRANSURETHRAL RESECTION OF BLADDER TUMOR WITH MITOMYCIN -C N/A 12/23/2019   Procedure: TRANSURETHRAL RESECTION OF BLADDER TUMOR WITH POST-OP  GEMCITABINE ;  Surgeon: Matilda Senior, MD;  Location:  [facility];  Service: Urology;  Laterality: N/A;  45 MINS   TRANSURETHRAL RESECTION OF BLADDER TUMOR WITH MITOMYCIN -C N/A 03/21/2022   Procedure: TRANSURETHRAL RESECTION OF BLADDER TUMOR WITH GEMCITABINE ;  Surgeon: Matilda Senior, MD;  Location: [facility];  Service: Urology;  Laterality: N/A;   Allergies[1]  Physical Exam     Results     Assessment/Plan of Care: No diagnosis found.   No orders of the defined types were placed in this encounter.  None  Assessment and Plan Assessment & Plan         Shawonda Kerce CHARM Imperial, DPM, FACFAS [facility]     [address] [place], [place] [address]                Office [phone]  Fax [phone]     [1]  Allergies Allergen Reactions   Aripiprazole     Other reaction(s): Unknown   Contrast Media [Iodinated Contrast Media] Rash   Dulaglutide     Other reaction(s): Unknown   Rosuvastatin Other (See Comments)    Other reaction(s): joint issues

## 2024-02-29 NOTE — Patient Instructions (Signed)

## 2024-03-29 ENCOUNTER — Ambulatory Visit (INDEPENDENT_AMBULATORY_CARE_PROVIDER_SITE_OTHER): Admitting: Podiatry

## 2024-03-29 DIAGNOSIS — M79675 Pain in left toe(s): Secondary | ICD-10-CM

## 2024-03-29 DIAGNOSIS — B351 Tinea unguium: Secondary | ICD-10-CM

## 2024-03-29 DIAGNOSIS — M79674 Pain in right toe(s): Secondary | ICD-10-CM

## 2024-03-29 NOTE — Progress Notes (Unsigned)
 Nails x 10

## 2024-05-31 ENCOUNTER — Ambulatory Visit: Admitting: Podiatry
